# Patient Record
Sex: Female | Born: 1965 | Race: White | Hispanic: No | Marital: Single | Smoking: Never smoker
Health system: Southern US, Community
[De-identification: ages and names within clinical notes are randomized; demographics above are authoritative.]

## PROBLEM LIST (undated history)

## (undated) DIAGNOSIS — I10 Essential (primary) hypertension: Secondary | ICD-10-CM

## (undated) DIAGNOSIS — E119 Type 2 diabetes mellitus without complications: Secondary | ICD-10-CM

## (undated) HISTORY — PX: NO PAST SURGERIES: SHX2092

---

## 2015-10-21 ENCOUNTER — Inpatient Hospital Stay
Admission: EM | Admit: 2015-10-21 | Discharge: 2015-10-22 | DRG: 918 | Disposition: A | Discharge status: Home / Self Care | Payer: No Typology Code available for payment source | Attending: Internal Medicine | Admitting: Internal Medicine

## 2015-10-21 ENCOUNTER — Emergency Department: Payer: Self-pay

## 2015-10-21 DIAGNOSIS — F32A Depression, unspecified: Secondary | ICD-10-CM

## 2015-10-21 DIAGNOSIS — E871 Hypo-osmolality and hyponatremia: Secondary | ICD-10-CM | POA: Diagnosis present

## 2015-10-21 DIAGNOSIS — F329 Major depressive disorder, single episode, unspecified: Secondary | ICD-10-CM | POA: Diagnosis present

## 2015-10-21 DIAGNOSIS — T1491XA Suicide attempt, initial encounter: Secondary | ICD-10-CM

## 2015-10-21 DIAGNOSIS — R45851 Suicidal ideations: Secondary | ICD-10-CM

## 2015-10-21 DIAGNOSIS — E119 Type 2 diabetes mellitus without complications: Secondary | ICD-10-CM

## 2015-10-21 DIAGNOSIS — E11649 Type 2 diabetes mellitus with hypoglycemia without coma: Secondary | ICD-10-CM | POA: Diagnosis present

## 2015-10-21 DIAGNOSIS — I10 Essential (primary) hypertension: Secondary | ICD-10-CM | POA: Diagnosis present

## 2015-10-21 DIAGNOSIS — F4310 Post-traumatic stress disorder, unspecified: Secondary | ICD-10-CM

## 2015-10-21 DIAGNOSIS — Z825 Family history of asthma and other chronic lower respiratory diseases: Secondary | ICD-10-CM

## 2015-10-21 DIAGNOSIS — E162 Hypoglycemia, unspecified: Secondary | ICD-10-CM | POA: Diagnosis present

## 2015-10-21 DIAGNOSIS — T383X2A Poisoning by insulin and oral hypoglycemic [antidiabetic] drugs, intentional self-harm, initial encounter: Principal | ICD-10-CM | POA: Diagnosis present

## 2015-10-21 HISTORY — DX: Essential (primary) hypertension: I10

## 2015-10-21 HISTORY — DX: Type 2 diabetes mellitus without complications: E11.9

## 2015-10-21 LAB — COMPREHENSIVE METABOLIC PANEL
ALBUMIN: 3.8 g/dL (ref 3.5–5.0)
ALK PHOS: 91 U/L (ref 38–126)
ALT: 24 U/L (ref 14–54)
ANION GAP: 9 (ref 5–15)
AST: 39 U/L (ref 15–41)
BILIRUBIN TOTAL: 1.1 mg/dL (ref 0.3–1.2)
BUN: 14 mg/dL (ref 6–20)
CALCIUM: 9.1 mg/dL (ref 8.9–10.3)
CO2: 23 mmol/L (ref 22–32)
Chloride: 101 mmol/L (ref 101–111)
Creatinine, Ser: 0.74 mg/dL (ref 0.44–1.00)
GFR calc Af Amer: >60 mL/min (ref >60)
GLUCOSE: 317 mg/dL — AB (ref 65–99)
Potassium: 4.8 mmol/L (ref 3.5–5.1)
Sodium: 133 mmol/L — ABNORMAL LOW (ref 135–145)
TOTAL PROTEIN: 7.4 g/dL (ref 6.5–8.1)

## 2015-10-21 LAB — GLUCOSE, CAPILLARY
GLUCOSE-CAPILLARY: 314 mg/dL — AB (ref 65–99)
GLUCOSE-CAPILLARY: 171 mg/dL — AB (ref 65–99)
GLUCOSE-CAPILLARY: 223 mg/dL — AB (ref 65–99)
GLUCOSE-CAPILLARY: 76 mg/dL (ref 65–99)
GLUCOSE-CAPILLARY: 85 mg/dL (ref 65–99)
GLUCOSE-CAPILLARY: 45 mg/dL — AB (ref 65–99)
GLUCOSE-CAPILLARY: 49 mg/dL — AB (ref 65–99)
GLUCOSE-CAPILLARY: 158 mg/dL — AB (ref 65–99)
GLUCOSE-CAPILLARY: 106 mg/dL — AB (ref 65–99)
GLUCOSE-CAPILLARY: 133 mg/dL — AB (ref 65–99)
Glucose-Capillary: 151 mg/dL — ABNORMAL HIGH (ref 65–99)
Glucose-Capillary: 169 mg/dL — ABNORMAL HIGH (ref 65–99)
Glucose-Capillary: 82 mg/dL (ref 65–99)
Glucose-Capillary: 107 mg/dL — ABNORMAL HIGH (ref 65–99)
Glucose-Capillary: 141 mg/dL — ABNORMAL HIGH (ref 65–99)
Glucose-Capillary: 135 mg/dL — ABNORMAL HIGH (ref 65–99)
Glucose-Capillary: 73 mg/dL (ref 65–99)
Glucose-Capillary: 127 mg/dL — ABNORMAL HIGH (ref 65–99)
Glucose-Capillary: 131 mg/dL — ABNORMAL HIGH (ref 65–99)

## 2015-10-21 LAB — URINE DRUG SCREEN, QUALITATIVE (ARMC ONLY)
Amphetamines, Ur Screen: NOT DETECTED
BARBITURATES, UR SCREEN: NOT DETECTED
BENZODIAZEPINE, UR SCRN: NOT DETECTED
Cannabinoid 50 Ng, Ur ~~LOC~~: NOT DETECTED
Cocaine Metabolite,Ur ~~LOC~~: NOT DETECTED
MDMA (Ecstasy)Ur Screen: NOT DETECTED
METHADONE SCREEN, URINE: NOT DETECTED
OPIATE, UR SCREEN: NOT DETECTED
PHENCYCLIDINE (PCP) UR S: NOT DETECTED
Tricyclic, Ur Screen: NOT DETECTED

## 2015-10-21 LAB — URINALYSIS COMPLETE WITH MICROSCOPIC (ARMC ONLY)
BILIRUBIN URINE: NEGATIVE
Bacteria, UA: NONE SEEN
Hgb urine dipstick: NEGATIVE
Leukocytes, UA: NEGATIVE
Nitrite: NEGATIVE
Protein, ur: 30 mg/dL — AB
Specific Gravity, Urine: 1.027 (ref 1.005–1.030)
pH: 5 (ref 5.0–8.0)

## 2015-10-21 LAB — CBC WITH DIFFERENTIAL/PLATELET
BASOS PCT: 0 %
Basophils Absolute: 0 10*3/uL (ref 0–0.1)
Eosinophils Absolute: 0.2 10*3/uL (ref 0–0.7)
Eosinophils Relative: 2 %
HEMATOCRIT: 43.9 % (ref 35.0–47.0)
HEMOGLOBIN: 15.4 g/dL (ref 12.0–16.0)
LYMPHS ABS: 1.6 10*3/uL (ref 1.0–3.6)
LYMPHS PCT: 17 %
MCH: 31.3 pg (ref 26.0–34.0)
MCHC: 35 g/dL (ref 32.0–36.0)
MCV: 89.3 fL (ref 80.0–100.0)
MONOS PCT: 5 %
Monocytes Absolute: 0.4 10*3/uL (ref 0.2–0.9)
NEUTROS ABS: 7.2 10*3/uL — AB (ref 1.4–6.5)
NEUTROS PCT: 76 %
Platelets: 252 10*3/uL (ref 150–440)
RBC: 4.92 MIL/uL (ref 3.80–5.20)
RDW: 13.1 % (ref 11.5–14.5)
WBC: 9.5 10*3/uL (ref 3.6–11.0)

## 2015-10-21 LAB — ETHANOL: Alcohol, Ethyl (B): <5 mg/dL (ref <5)

## 2015-10-21 LAB — SALICYLATE LEVEL

## 2015-10-21 LAB — HCG, QUANTITATIVE, PREGNANCY: hCG, Beta Chain, Quant, S: 2 m[IU]/mL (ref <5)

## 2015-10-21 LAB — ACETAMINOPHEN LEVEL: Acetaminophen (Tylenol), Serum: <10 ug/mL — ABNORMAL LOW (ref 10–30)

## 2015-10-21 MED ORDER — DEXTROSE 10 % IV SOLN
INTRAVENOUS | Status: DC
  Administered 2015-10-21 – 2015-10-22 (×2): via INTRAVENOUS

## 2015-10-21 MED ORDER — DEXTROSE 50 % IV SOLN
1.0000 | Freq: Once | INTRAVENOUS | Status: AC
  Administered 2015-10-21: 50 mL via INTRAVENOUS
  Filled 2015-10-21: qty 50

## 2015-10-21 MED ORDER — LORAZEPAM 2 MG/ML IJ SOLN
2.0000 mg | Freq: Once | INTRAMUSCULAR | Status: AC
  Administered 2015-10-21: 2 mg via INTRAVENOUS

## 2015-10-21 MED ORDER — HALOPERIDOL LACTATE 5 MG/ML IJ SOLN
INTRAMUSCULAR | Status: AC
  Administered 2015-10-21: 5 mg via INTRAVENOUS
  Filled 2015-10-21: qty 1

## 2015-10-21 MED ORDER — DEXTROSE-NACL 5-0.9 % IV SOLN: INTRAVENOUS | Status: DC

## 2015-10-21 MED ORDER — ENOXAPARIN SODIUM 40 MG/0.4ML ~~LOC~~ SOLN
40.0000 mg | SUBCUTANEOUS | Status: DC
  Administered 2015-10-22: 40 mg via SUBCUTANEOUS
  Filled 2015-10-21: qty 0.4

## 2015-10-21 MED ORDER — ONDANSETRON HCL 4 MG PO TABS: 4.0000 mg | ORAL_TABLET | Freq: Four times a day (QID) | ORAL | Status: DC | PRN

## 2015-10-21 MED ORDER — DEXTROSE 50 % IV SOLN
INTRAVENOUS | Status: AC
  Filled 2015-10-21: qty 50

## 2015-10-21 MED ORDER — DEXTROSE 50 % IV SOLN
1.0000 | Freq: Once | INTRAVENOUS | Status: AC
  Administered 2015-10-21: 50 mL via INTRAVENOUS

## 2015-10-21 MED ORDER — KCL IN DEXTROSE-NACL 20-5-0.45 MEQ/L-%-% IV SOLN
Freq: Once | INTRAVENOUS | Status: AC
  Administered 2015-10-21: 13:00:00 via INTRAVENOUS
  Filled 2015-10-21: qty 1000

## 2015-10-21 MED ORDER — SODIUM CHLORIDE 0.9 % IV BOLUS (SEPSIS)
2000.0000 mL | Freq: Once | INTRAVENOUS | Status: AC
  Administered 2015-10-21: 2000 mL via INTRAVENOUS

## 2015-10-21 MED ORDER — LORAZEPAM 2 MG/ML IJ SOLN
INTRAMUSCULAR | Status: AC
  Administered 2015-10-21: 2 mg via INTRAVENOUS
  Filled 2015-10-21: qty 1

## 2015-10-21 MED ORDER — ONDANSETRON HCL 4 MG/2ML IJ SOLN: 4.0000 mg | Freq: Four times a day (QID) | INTRAMUSCULAR | Status: DC | PRN

## 2015-10-21 MED ORDER — HALOPERIDOL LACTATE 5 MG/ML IJ SOLN
5.0000 mg | Freq: Once | INTRAMUSCULAR | Status: AC
  Administered 2015-10-21: 5 mg via INTRAVENOUS

## 2015-10-21 MED ORDER — DEXTROSE 50 % IV SOLN
INTRAVENOUS | Status: AC
  Administered 2015-10-21: 20:00:00 50 mL
  Filled 2015-10-21: qty 50

## 2015-10-21 NOTE — ED Notes (Addendum)
EMS reports CBG of 380, CBG of 318 here

## 2015-10-21 NOTE — ED Notes (Signed)
Family member Actor called for info on pt. Told her we can't give her any info. She wanted to leave her phone number for pt to call, but did not know her phone number to give.

## 2015-10-21 NOTE — ED Provider Notes (Addendum)
Case discussed with poison control. Because of the patient's ongoing need for dextrose, poison control advises ongoing observation glucose checks and possible need to switch to D10 if further hyperglycemic episodes. Because of the patient's ongoing hypoglycemia we will admit her to the hospital.  It is noted that the patient is a member of the [place] benefits, however the patient due to severe agitation had to be sedated and restrained. The patient is currently sedated, and unable to discuss or sign benefit papers. However, as her current treating physician with the patient's requirements for sedation, and overdose with ongoing treatment and re-dosing of dextrose and a dextrose infusion I do not feel she is stable for transfer to the [facility] an hour away in [place] at this time. Patient will be admitted to our hospital.   Sharyn Creamer, MD 10/21/15 1647   Discussed with at [facility], Beryle Quant who is AOD and placed not regarding why patient unabel to consent for transport today.   Sharyn Creamer, MD 10/21/15 [phone]

## 2015-10-21 NOTE — ED Notes (Signed)
Pt resting with eyes closed.

## 2015-10-21 NOTE — H&P (Signed)
[facility] -  at [facility]   PATIENT NAME: Destiny Palmer    MR#:  [id number]  DATE OF BIRTH:  Sep 04, 1965  DATE OF ADMISSION:  10/21/2015  PRIMARY CARE PHYSICIAN: [facility]   REQUESTING/REFERRING PHYSICIAN: Dr Sharyn Creamer  CHIEF COMPLAINT:   Chief Complaint  Patient presents with  . Drug Overdose    HISTORY OF PRESENT ILLNESS:  Destiny Palmer  is a 50 y.o. female with a known history of Diabetes and hypertension. Brought in after she took 200 units of Novolin insulin. In the ER she was agitated and needed to be medicated. She is lethargic now but able to answer some questions. The patient told me that she took 200 units of Novolin because her sugars were high at 440. She denied any suicidal ideation for me. She had poor eye contact with me the entire time and I had to keep on asking her the same questions over and over again. ER physician put her on suicide precautions and involuntarily committed her. ER physician was concerned that she may have taken 200 units of Lantus insulin.  PAST MEDICAL HISTORY:   Past Medical History:  Diagnosis Date  . Diabetes mellitus without complication (HCC)   . Hypertension     PAST SURGICAL HISTORY:   Past Surgical History:  Procedure Laterality Date  . NO PAST SURGERIES      SOCIAL HISTORY:   Social History  Substance Use Topics  . Smoking status: Never Smoker  . Smokeless tobacco: Never Used  . Alcohol use Yes    FAMILY HISTORY:   Family History  Problem Relation Age of Onset  . COPD Mother     DRUG ALLERGIES:  No Known Allergies  REVIEW OF SYSTEMS:  Unable to obtain secondary to altered mental status or mood.  MEDICATIONS AT HOME:   Prior to Admission medications   Not on File    Patient stated she takes metformin, Lantus and Novolin insulin and a blood pressure medication. She gets her medications from a mail away order.  VITAL SIGNS:  Blood pressure 127/81, pulse 96,  temperature 98.4 F (36.9 C), resp. rate 19, height 5\' 6"  (1.676 m), weight 88 kg (194 lb), SpO2 96 %.  PHYSICAL EXAMINATION:  GENERAL:  50 y.o.-year-old patient lying in the bed with no acute distress.  EYES: Pupils equal, round, reactive to light and accommodation. No scleral icterus. HEENT: Head atraumatic, normocephalic. Oropharynx and nasopharynx clear.  NECK:  Supple, no jugular venous distention. No thyroid enlargement, no tenderness.  LUNGS: Normal breath sounds bilaterally, no wheezing, rales,rhonchi or crepitation. No use of accessory muscles of respiration.  CARDIOVASCULAR: S1, S2 normal. No murmurs, rubs, or gallops.  ABDOMEN: Soft, nontender, nondistended. Bowel sounds present. No organomegaly or mass.  EXTREMITIES: No pedal edema, cyanosis, or clubbing.  NEUROLOGIC: Patient moves her arms on her own. PSYCHIATRIC: The patient is lethargic SKIN: No rash, lesion, or ulcer.   LABORATORY PANEL:   CBC  Recent Labs Lab 10/21/15 1219  WBC 9.5  HGB 15.4  HCT 43.9  PLT 252   ------------------------------------------------------------------------------------------------------------------  Chemistries   Recent Labs Lab 10/21/15 1219  NA 133*  K 4.8  CL 101  CO2 23  GLUCOSE 317*  BUN 14  CREATININE 0.74  CALCIUM 9.1  AST 39  ALT 24  ALKPHOS 91  BILITOT 1.1   ------------------------------------------------------------------------------------------------------------------   RADIOLOGY:  Dg Chest 1 View  Result Date: 10/21/2015 CLINICAL DATA:  Overdose.  Tachycardia. EXAM: CHEST 1 VIEW  COMPARISON:  None. FINDINGS: The cardiomediastinal silhouette is within normal limits. No airspace consolidation, edema, pleural effusion, or pneumothorax is identified. No acute osseous abnormality is seen. IMPRESSION: No active disease. Electronically Signed   By: Sebastian Ache M.D.   On: 10/21/2015 13:46    IMPRESSION AND PLAN:   1. Hypoglycemia in a diabetic patient that took  200 units of insulin which is likely Novolin insulin. Patient is on D5 drip. Check fingersticks every 2 hours. If she took too much of the NovoLog insulin hopefully this will be out of her system shortly. Her initial sugar was 317 at 12 noon. If she took 200 units of Lantus insulin this may be a more prolonged hypoglycemia over the next 24 hours or so. If sugars continue to trend low I may have to switch to D10 drip. 2. Suspected suicide attempt. 1-1 and suicide watch. Psychiatric consultation. 3. Essential hypertension. Blood pressure medication on hold because I'm not sure what it is. 4. Hyponatremia. Switch IV fluids D5ns.   All the records are reviewed and case discussed with ED provider. Management plans discussed with the patient, and she is in agreement. Felicia the aide in the emergency room was present during the entire history and physical exam for this patient.  CODE STATUS: Full code  TOTAL TIME TAKING CARE OF THIS PATIENT: 50 minutes.    Alford Highland M.D on 10/21/2015 at 5:00 PM  Between 7am to 6pm - Pager - [phone]  After 6pm call admission pager [phone]  [facility]  [phone]  CC: Primary care physician; [facility]

## 2015-10-21 NOTE — ED Notes (Signed)
Glucose check is 82. RN Delorise Shiner notified.

## 2015-10-21 NOTE — ED Notes (Signed)
Pt becoming agitated and combative with staff again. EDP made aware and verbal orders received for 2mg  ativan and 5mg  haldol

## 2015-10-21 NOTE — ED Notes (Signed)
Pt offered meal tray, pt refusing to eat at this time.

## 2015-10-21 NOTE — ED Notes (Signed)
Called [place] at 1700 and spoke to [place], AOD, she was documenting in the computer patient unable to consent for transfer and when she was able to consent to initiate transfer.

## 2015-10-21 NOTE — ED Notes (Signed)
Report called to floor, told they need 15 min to set up room for suicide precautions

## 2015-10-21 NOTE — ED Provider Notes (Addendum)
[facility] Emergency Department Provider Note  ___________________________________________   None    (approximate)  I have reviewed the triage vital signs and the nursing notes.   HISTORY  Chief Complaint Drug Overdose  HPI Destiny Palmer is a 50 y.o. female who in efforts to harm herself per EMS and Sheriff's Department, patient took 200 units of not doing insulin. Patient takes insulin for her own diabetes and she states that she uses her own sliding scale not prescribed by Dr. When asked patient states that she just takes is much insulin as she thinks she needs. Patient today took 200 units of Novulin Insulin but she will not talk to [place] and tell us why she took it or what has been bothering her lately. Patient is giving very short answers and will not discuss further any details as to why she did which she did. IVC papers are being drawn for patient per a family member and she presented to the emergency department with the sheriff. We also found out from [facility] the patient had called the help line and she had also attempted to harm herself with a knife and now there is a question if she use 150 units or 200 units of Novolin regular.     Past Medical History:  Diagnosis Date  . Diabetes mellitus without complication (HCC)   . Hypertension     There are no active problems to display for this patient.   History reviewed. No pertinent surgical history.  Prior to Admission medications   Not on File    Allergies Review of patient's allergies indicates no known allergies.  No family history on file.  Social History Social History  Substance Use Topics  . Smoking status: Never Smoker  . Smokeless tobacco: Not on file  . Alcohol use Yes    Review of Systems Constitutional: No fever/chills Eyes: No visual changes. ENT: No sore throat. Cardiovascular: Denies chest pain. Respiratory: Denies shortness of breath. Gastrointestinal: No abdominal  pain.  No nausea, no vomiting.  No diarrhea.  No constipation. Genitourinary: Negative for dysuria. Musculoskeletal: Negative for back pain. Skin: Negative for rash. Neurological: Negative for headaches, focal weakness or numbness. Psychological: Suicide attempt, depression, unknown reason for psychosis 10-point ROS otherwise negative.  ____________________________________________   PHYSICAL EXAM:  VITAL SIGNS: ED Triage Vitals  Enc Vitals Group     BP 10/21/15 1211 (!) 189/95     Pulse Rate 10/21/15 1213 (!) 115     Resp 10/21/15 1214 19     Temp 10/21/15 1211 98.4 F (36.9 C)     Temp src --      SpO2 10/21/15 1213 96 %     Weight 10/21/15 1211 194 lb (88 kg)     Height 10/21/15 1211  (1.676 m)     Head Circumference --      Peak Flow --      Pain Score --      Pain Loc --      Pain Edu? --      Excl. in GC? --     Constitutional: Alert and oriented. Well appearing and in no acute distress. Pt disheveled though. Eyes: Conjunctivae are normal. PERRL. EOMI. Head: Atraumatic. Nose: No congestion/rhinnorhea. Mouth/Throat: Mucous membranes are moist.  Oropharynx non-erythematous. Neck: No stridor.   Cardiovascular: Tachycardia, regular rhythm. Grossly normal heart sounds.  Good peripheral circulation. Respiratory: Normal respiratory effort.  No retractions. Lungs CTAB. Gastrointestinal: Soft and nontender. No distention. No  abdominal bruits. No CVA tenderness. Musculoskeletal: No lower extremity tenderness nor edema.  No joint effusions. Neurologic:  Normal speech and language. No gross focal neurologic deficits are appreciated. No gait instability. Skin:  Skin is warm, dry and intact. No rash noted. Psychiatric: Mood and affect are depressed and a standoffish.  [name] Speech and behavior are normal.  ____________________________________________   LABS (all labs ordered are listed, but only abnormal results are displayed)  Labs Reviewed  CBC WITH  DIFFERENTIAL/PLATELET - Abnormal; Notable for the following:       Result Value   Neutro Abs 7.2 (*)    All other components within normal limits  COMPREHENSIVE METABOLIC PANEL - Abnormal; Notable for the following:    Sodium 133 (*)    Glucose, Bld 317 (*)    All other components within normal limits  URINALYSIS COMPLETEWITH MICROSCOPIC ([facility] ONLY) - Abnormal; Notable for the following:    Color, Urine YELLOW (*)    APPearance CLEAR (*)    Glucose, UA >500 (*)    Ketones, ur TRACE (*)    Protein, ur 30 (*)    Squamous Epithelial / LPF 0-5 (*)    All other components within normal limits  GLUCOSE, CAPILLARY - Abnormal; Notable for the following:    Glucose-Capillary 314 (*)    All other components within normal limits  GLUCOSE, CAPILLARY - Abnormal; Notable for the following:    Glucose-Capillary 223 (*)    All other components within normal limits  GLUCOSE, CAPILLARY - Abnormal; Notable for the following:    Glucose-Capillary 171 (*)    All other components within normal limits  GLUCOSE, CAPILLARY - Abnormal; Notable for the following:    Glucose-Capillary 151 (*)    All other components within normal limits  ETHANOL  URINE DRUG SCREEN, QUALITATIVE ([facility] ONLY)  HCG, QUANTITATIVE, PREGNANCY  GLUCOSE, CAPILLARY   ____________________________________________  EKG ED ECG REPORT I, Leona Carry, the attending physician, personally viewed and interpreted this ECG.   Date: 10/21/2015  EKG Time: 1217  Rate: 111  Rhythm: sinus tachycardia  Axis: Left axis deviation  Intervals:none  ST&T Change: Poor R-wave progression and nonspecific ST changes   ____________________________________________  RADIOLOGY Dg Chest 1 View  Result Date: 10/21/2015 CLINICAL DATA:  Overdose.  Tachycardia. EXAM: CHEST 1 VIEW COMPARISON:  None. FINDINGS: The cardiomediastinal silhouette is within normal limits. No airspace consolidation, edema, pleural effusion, or pneumothorax is identified.  No acute osseous abnormality is seen. IMPRESSION: No active disease. Electronically Signed   By: Sebastian Ache M.D.   On: 10/21/2015 13:46    ____________________________________________   PROCEDURES  Procedure(s) performed: None  Procedures  Critical Care performed: No  ____________________________________________   INITIAL IMPRESSION / ASSESSMENT AND PLAN / ED COURSE  Pertinent labs & imaging results that were available during my care of the patient were reviewed by me and considered in my medical decision making (see chart for details).  2:50 PM Patient's initial glucometer was high. Patient was placed with a one-on-one sitter, started on D5 half-normal saline, we're going to attempt to feed her as well, and do to 30 minute Accu-Cheks.  2:50 PM Patient is now cooperative and much call Mark. Patient tried to rip out all of her IVs, got extremely tachycardic, and we had to place her in soft restraints. Patient is currently cooperative. Patient's glucometer is a been trending down and now she is at 151.  2:50 PM Patient's glucose dropped to 81. Patient refused to eat.  Patient was given amp of dextrose. Pt will be signed out to Dr. Fanny Bien at 4 PM. Patient is to be observed for total 6 hours medically before being signed over to psychiatry once her glucometer has stabilized.  3:37 PM Patient's blood sugar again dropped to 107. Patient also again was trying to pull out all of her IVs and trying to rip off her restraints. Patient was given 5 of Haldol and 2 of Ativan IM so that we can continue her treatment.  Clinical Course     ____________________________________________   FINAL CLINICAL IMPRESSION(S) / ED DIAGNOSES  Final diagnoses:  Depression  Insulin overdose, intentional self-harm, initial encounter (HCC)  Suicidal ideations  Suicide attempt (HCC)      NEW MEDICATIONS STARTED DURING THIS VISIT:  New Prescriptions   No medications on file     Note:  This  document was prepared using Dragon voice recognition software and may include unintentional dictation errors.    Leona Carry, MD 10/21/15 1450    Leona Carry, MD 10/21/15 [phone]

## 2015-10-21 NOTE — ED Notes (Signed)
Pt pulling at IVs and becoming aggressive with staff, HR noted to be 180-200 BPM, EDP made aware and verbal orders received for 2L fluid bolus. Pt placed on Zoll monitor

## 2015-10-21 NOTE — ED Notes (Signed)
Pt attempting to pull IV out, IV wrapped with gauze, sitter and sheriff at bedside

## 2015-10-21 NOTE — ED Triage Notes (Signed)
Pt BIB EMS for overdose, pt with sheriff due to IVC papers. Pt reports thoughts of suicide, took 200 units of Novolin Insulin 30 min PTA.

## 2015-10-22 ENCOUNTER — Inpatient Hospital Stay
Admission: AD | Admit: 2015-10-22 | Discharge: 2015-10-27 | DRG: 882 | Disposition: A | Discharge status: Home / Self Care | Payer: No Typology Code available for payment source | Source: Intra-hospital | Attending: Psychiatry | Admitting: Psychiatry

## 2015-10-22 DIAGNOSIS — E11649 Type 2 diabetes mellitus with hypoglycemia without coma: Secondary | ICD-10-CM | POA: Diagnosis present

## 2015-10-22 DIAGNOSIS — E119 Type 2 diabetes mellitus without complications: Secondary | ICD-10-CM | POA: Diagnosis present

## 2015-10-22 DIAGNOSIS — Z9114 Patient's other noncompliance with medication regimen: Secondary | ICD-10-CM

## 2015-10-22 DIAGNOSIS — F4325 Adjustment disorder with mixed disturbance of emotions and conduct: Secondary | ICD-10-CM | POA: Diagnosis present

## 2015-10-22 DIAGNOSIS — Z915 Personal history of self-harm: Secondary | ICD-10-CM | POA: Diagnosis not present

## 2015-10-22 DIAGNOSIS — I10 Essential (primary) hypertension: Secondary | ICD-10-CM | POA: Diagnosis present

## 2015-10-22 DIAGNOSIS — G4700 Insomnia, unspecified: Secondary | ICD-10-CM | POA: Diagnosis present

## 2015-10-22 DIAGNOSIS — T1491XA Suicide attempt, initial encounter: Secondary | ICD-10-CM | POA: Diagnosis present

## 2015-10-22 DIAGNOSIS — E1169 Type 2 diabetes mellitus with other specified complication: Secondary | ICD-10-CM | POA: Diagnosis present

## 2015-10-22 DIAGNOSIS — F4310 Post-traumatic stress disorder, unspecified: Principal | ICD-10-CM | POA: Diagnosis present

## 2015-10-22 DIAGNOSIS — Z794 Long term (current) use of insulin: Secondary | ICD-10-CM

## 2015-10-22 DIAGNOSIS — E785 Hyperlipidemia, unspecified: Secondary | ICD-10-CM | POA: Diagnosis present

## 2015-10-22 DIAGNOSIS — Z79899 Other long term (current) drug therapy: Secondary | ICD-10-CM | POA: Diagnosis not present

## 2015-10-22 DIAGNOSIS — Z653 Problems related to other legal circumstances: Secondary | ICD-10-CM | POA: Diagnosis not present

## 2015-10-22 DIAGNOSIS — R45851 Suicidal ideations: Secondary | ICD-10-CM | POA: Diagnosis present

## 2015-10-22 DIAGNOSIS — Z825 Family history of asthma and other chronic lower respiratory diseases: Secondary | ICD-10-CM | POA: Diagnosis not present

## 2015-10-22 LAB — GLUCOSE, CAPILLARY
GLUCOSE-CAPILLARY: 245 mg/dL — AB (ref 65–99)
GLUCOSE-CAPILLARY: 248 mg/dL — AB (ref 65–99)
GLUCOSE-CAPILLARY: 313 mg/dL — AB (ref 65–99)
Glucose-Capillary: 200 mg/dL — ABNORMAL HIGH (ref 65–99)
Glucose-Capillary: 228 mg/dL — ABNORMAL HIGH (ref 65–99)
Glucose-Capillary: 244 mg/dL — ABNORMAL HIGH (ref 65–99)
Glucose-Capillary: 249 mg/dL — ABNORMAL HIGH (ref 65–99)
Glucose-Capillary: 254 mg/dL — ABNORMAL HIGH (ref 65–99)
Glucose-Capillary: 270 mg/dL — ABNORMAL HIGH (ref 65–99)

## 2015-10-22 LAB — HEMOGLOBIN A1C: HEMOGLOBIN A1C: 9 % — AB (ref 4.0–6.0)

## 2015-10-22 LAB — CBC
HCT: 41.6 % (ref 35.0–47.0)
Hemoglobin: 14.6 g/dL (ref 12.0–16.0)
MCH: 31.4 pg (ref 26.0–34.0)
MCHC: 35 g/dL (ref 32.0–36.0)
MCV: 89.7 fL (ref 80.0–100.0)
PLATELETS: 199 10*3/uL (ref 150–440)
RBC: 4.64 MIL/uL (ref 3.80–5.20)
RDW: 12.7 % (ref 11.5–14.5)
WBC: 8.2 10*3/uL (ref 3.6–11.0)

## 2015-10-22 LAB — BASIC METABOLIC PANEL
Anion gap: 7 (ref 5–15)
CHLORIDE: 104 mmol/L (ref 101–111)
CO2: 25 mmol/L (ref 22–32)
CREATININE: 0.56 mg/dL (ref 0.44–1.00)
Calcium: 8.5 mg/dL — ABNORMAL LOW (ref 8.9–10.3)
Glucose, Bld: 254 mg/dL — ABNORMAL HIGH (ref 65–99)
Potassium: 3.8 mmol/L (ref 3.5–5.1)
SODIUM: 136 mmol/L (ref 135–145)

## 2015-10-22 MED ORDER — MAGNESIUM HYDROXIDE 400 MG/5ML PO SUSP: 30.0000 mL | Freq: Every day | ORAL | Status: DC | PRN

## 2015-10-22 MED ORDER — INSULIN ASPART 100 UNIT/ML ~~LOC~~ SOLN
0.0000 [IU] | Freq: Three times a day (TID) | SUBCUTANEOUS | Status: DC
  Administered 2015-10-22: 8 [IU] via SUBCUTANEOUS
  Filled 2015-10-22: qty 8

## 2015-10-22 MED ORDER — INSULIN GLARGINE 100 UNIT/ML ~~LOC~~ SOLN
20.0000 [IU] | Freq: Every day | SUBCUTANEOUS | Status: DC
Start: 2015-10-23 — End: 2015-10-24
  Administered 2015-10-23 – 2015-10-24 (×2): 20 [IU] via SUBCUTANEOUS
  Filled 2015-10-22 (×2): qty 0.2

## 2015-10-22 MED ORDER — ALUM & MAG HYDROXIDE-SIMETH 200-200-20 MG/5ML PO SUSP
30.0000 mL | ORAL | Status: DC | PRN
  Filled 2015-10-22: qty 30

## 2015-10-22 MED ORDER — INSULIN ASPART 100 UNIT/ML ~~LOC~~ SOLN
0.0000 [IU] | Freq: Three times a day (TID) | SUBCUTANEOUS | Status: DC
  Administered 2015-10-22: 5 [IU] via SUBCUTANEOUS
  Administered 2015-10-23: 8 [IU] via SUBCUTANEOUS
  Administered 2015-10-23: 5 [IU] via SUBCUTANEOUS
  Administered 2015-10-24: 8 [IU] via SUBCUTANEOUS
  Administered 2015-10-24: 5 [IU] via SUBCUTANEOUS
  Administered 2015-10-24 – 2015-10-25 (×3): 8 [IU] via SUBCUTANEOUS
  Administered 2015-10-25: 5 [IU] via SUBCUTANEOUS
  Administered 2015-10-26 (×2): 11 [IU] via SUBCUTANEOUS
  Administered 2015-10-26: 5 [IU] via SUBCUTANEOUS
  Administered 2015-10-27: 8 [IU] via SUBCUTANEOUS
  Administered 2015-10-27: 3 [IU] via SUBCUTANEOUS
  Administered 2015-10-27: 15 [IU] via SUBCUTANEOUS
  Filled 2015-10-22: qty 11
  Filled 2015-10-22 (×2): qty 8
  Filled 2015-10-22: qty 11
  Filled 2015-10-22 (×2): qty 8
  Filled 2015-10-22: qty 5
  Filled 2015-10-22 (×2): qty 8
  Filled 2015-10-22 (×2): qty 5
  Filled 2015-10-22 (×2): qty 3

## 2015-10-22 MED ORDER — ACETAMINOPHEN 325 MG PO TABS: 650.0000 mg | ORAL_TABLET | Freq: Four times a day (QID) | ORAL | Status: DC | PRN

## 2015-10-22 MED ORDER — INSULIN GLARGINE 100 UNIT/ML ~~LOC~~ SOLN
20.0000 [IU] | Freq: Every day | SUBCUTANEOUS | Status: DC
  Administered 2015-10-22: 15:00:00 20 [IU] via SUBCUTANEOUS
  Filled 2015-10-22 (×2): qty 0.2

## 2015-10-22 MED ORDER — INSULIN ASPART 100 UNIT/ML ~~LOC~~ SOLN: 0.0000 [IU] | Freq: Three times a day (TID) | SUBCUTANEOUS | Status: DC

## 2015-10-22 NOTE — BH Assessment (Signed)
Patient is to be admitted to [facility] by Dr. Toni Amend.  Attending Physician will be Dr. Ardyth Harps.   Patient has been assigned to room 325, by [facility] Charge Nurse Gwen.   Intake Paper Work has been signed and placed on patient chart.  Staff is aware of the admission Corrie Dandy, Patient's Nurse & Marny Lowenstein, Patient Access).

## 2015-10-22 NOTE — BH Assessment (Signed)
Writer called and confirmed the [facility] and was on [employer]. Writer talked with the AOD ([phone]).

## 2015-10-22 NOTE — Consult Note (Signed)
[place] [facility]iatry Consult   Reason for Consult:  Consult for 50 year old woman presented to the hospital yesterday after intentional overdose of insulin Referring Physician:  Posey Pronto Patient Identification: [facility] MRN:  [id number] Principal Diagnosis: PTSD (post-traumatic stress disorder) Diagnosis:   Patient Active Problem List   Diagnosis Date Noted  . Suicide attempt ([place]) [T14.91] 10/22/2015  . PTSD (post-traumatic stress disorder) [F43.10] 10/22/2015  . Diabetes mellitus without complication ([place]) [M57.8] 10/22/2015  . Hypoglycemia [E16.2] 10/21/2015    Total Time spent with patient: 1 hour  Subjective:   YULIANNA FOLSE is a 50 y.o. female patient admitted with "I took a lot of insulin because it was high".  HPI:  Patient interviewed. Chart reviewed. Labs and vitals reviewed. 50 year old woman presented to the emergency room yesterday after taking 200 units of insulin. Patient claims that she did this because her blood sugar reading was elevated. At the same time she admits that she was feeling very stressed out and overwhelmed at the time. She has been under a great deal of stress and feels like things have reached "the breaking point" for her. Mood stays nervous and down and overwhelmed a lot of the time. She has had chronic anxiety and depression problems ever since being in the [employer]. She sleeps poorly at night only a few hours at a time. Patient tried to claim that her insulin usage was simply in response to her blood sugars but also admitted that she only at most uses about 150 units a day so it was clearly not normal to take the 200. She denied that she had been drinking or abusing any drugs. Major stresses in her life include financial and legal problems. Yesterday the police came to break into her house and search should with a warrant because of what she claims is a misunderstanding involving her mother having committed credit card fraud. This is on top of  multiple other medical and social problems involving finances. Patient does see an outpatient therapist or psychiatrist at the [facility]. Seems to have little specific information about it. Says that she is generally compliant with medicine although she hadn't taken her medicine in the last day or so.  Social history: Lives with her niece. She is retired from [employer] and has a pension and is service connected with the [place]. Doesn't have much activity during the day. Evidently has significant ongoing legal problems which she blames on other members of her family.  Medical history: Diabetes. Her management of it and use of insulin appears to be rather disorganized from the way she describes it. Also has high blood pressure and elevated cholesterol but doesn't know the names of any of the medicines that she takes.  Substance abuse history: Says she drinks on occasion denies alcohol abuse. Denies any drug abuse.  Past Psychiatric History: Sees a doctor or therapist or both at the [facility]. Was actually on the phone with him she says yesterday when she overdosed. She claims she has never been admitted to a psychiatric hospital in the past. She does have a positive prior history of suicide attempts either when she was in jail or right after which was just a couple months ago. Does not know the medicines that she is taking. She does say that she has been given a diagnosis of posttraumatic stress disorder.  Risk to Self: Is patient at risk for suicide?: Yes Risk to Others:   Prior Inpatient Therapy:   Prior Outpatient Therapy:  Past Medical History:  Past Medical History:  Diagnosis Date  . Diabetes mellitus without complication ([place])   . Hypertension     Past Surgical History:  Procedure Laterality Date  . NO PAST SURGERIES     Family History:  Family History  Problem Relation Age of Onset  . COPD Mother    Family Psychiatric  History: Substance abuse problems in her family also a brother  who shot himself relatively recently. Social History:  History  Alcohol Use  . Yes     History  Drug Use No    Social History   Social History  . Marital status: Single    Spouse name: N/A  . Number of children: N/A  . Years of education: N/A   Social History Main Topics  . Smoking status: Never Smoker  . Smokeless tobacco: Never Used  . Alcohol use Yes  . Drug use: No  . Sexual activity: Not Asked   Other Topics Concern  . None   Social History Narrative  . None   Additional Social History:    Allergies:  No Known Allergies  Labs:  Results for orders placed or performed during the hospital encounter of 10/21/15 (from the past 48 hour(s))  Glucose, capillary     Status: Abnormal   Collection Time: 10/21/15 12:14 PM  Result Value Ref Range   Glucose-Capillary 314 (H) 65 - 99 mg/dL  CBC with Differential     Status: Abnormal   Collection Time: 10/21/15 12:19 PM  Result Value Ref Range   WBC 9.5 3.6 - 11.0 K/uL   RBC 4.92 3.80 - 5.20 MIL/uL   Hemoglobin 15.4 12.0 - 16.0 g/dL   HCT 43.9 35.0 - 47.0 %   MCV 89.3 80.0 - 100.0 fL   MCH 31.3 26.0 - 34.0 pg   MCHC 35.0 32.0 - 36.0 g/dL   RDW 13.1 11.5 - 14.5 %   Platelets 252 150 - 440 K/uL   Neutrophils Relative % 76 %   Neutro Abs 7.2 (H) 1.4 - 6.5 K/uL   Lymphocytes Relative 17 %   Lymphs Abs 1.6 1.0 - 3.6 K/uL   Monocytes Relative 5 %   Monocytes Absolute 0.4 0.2 - 0.9 K/uL   Eosinophils Relative 2 %   Eosinophils Absolute 0.2 0 - 0.7 K/uL   Basophils Relative 0 %   Basophils Absolute 0.0 0 - 0.1 K/uL  Comprehensive metabolic panel     Status: Abnormal   Collection Time: 10/21/15 12:19 PM  Result Value Ref Range   Sodium 133 (L) 135 - 145 mmol/L   Potassium 4.8 3.5 - 5.1 mmol/L    Comment: HEMOLYSIS AT THIS LEVEL MAY AFFECT RESULT   Chloride 101 101 - 111 mmol/L   CO2 23 22 - 32 mmol/L   Glucose, Bld 317 (H) 65 - 99 mg/dL   BUN 14 6 - 20 mg/dL   Creatinine, Ser 0.74 0.44 - 1.00 mg/dL   Calcium 9.1  8.9 - 10.3 mg/dL   Total Protein 7.4 6.5 - 8.1 g/dL   Albumin 3.8 3.5 - 5.0 g/dL   AST 39 15 - 41 U/L   ALT 24 14 - 54 U/L   Alkaline Phosphatase 91 38 - 126 U/L   Total Bilirubin 1.1 0.3 - 1.2 mg/dL   GFR calc non Af Amer >60 >60 mL/min   GFR calc Af Amer >60 >60 mL/min    Comment: (NOTE) The eGFR has been calculated using the CKD EPI equation.  This calculation has not been validated in all clinical situations. eGFR's persistently <60 mL/min signify possible Chronic Kidney Disease.    Anion gap 9 5 - 15  Ethanol     Status: None   Collection Time: 10/21/15 12:19 PM  Result Value Ref Range   Alcohol, Ethyl (B) <5 <5 mg/dL    Comment:        LOWEST DETECTABLE LIMIT FOR SERUM ALCOHOL IS 5 mg/dL FOR MEDICAL PURPOSES ONLY   hCG, quantitative, pregnancy     Status: None   Collection Time: 10/21/15 12:19 PM  Result Value Ref Range   hCG, Beta Chain, Quant, S 2 <5 mIU/mL    Comment:          GEST. AGE      CONC.  (mIU/mL)   <=1 WEEK        5 - 50     2 WEEKS       50 - 500     3 WEEKS       100 - 10,000     4 WEEKS     1,000 - 30,000     5 WEEKS     3,500 - 115,000   6-8 WEEKS     12,000 - 270,000    12 WEEKS     15,000 - 220,000        FEMALE AND NON-PREGNANT FEMALE:     LESS THAN 5 mIU/mL   Salicylate level     Status: None   Collection Time: 10/21/15 12:19 PM  Result Value Ref Range   Salicylate Lvl <2.6 2.8 - 30.0 mg/dL  Acetaminophen level     Status: Abnormal   Collection Time: 10/21/15 12:19 PM  Result Value Ref Range   Acetaminophen (Tylenol), Serum <10 (L) 10 - 30 ug/mL    Comment:        THERAPEUTIC CONCENTRATIONS VARY SIGNIFICANTLY. A RANGE OF 10-30 ug/mL MAY BE AN EFFECTIVE CONCENTRATION FOR MANY PATIENTS. HOWEVER, SOME ARE BEST TREATED AT CONCENTRATIONS OUTSIDE THIS RANGE. ACETAMINOPHEN CONCENTRATIONS >150 ug/mL AT 4 HOURS AFTER INGESTION AND >50 ug/mL AT 12 HOURS AFTER INGESTION ARE OFTEN ASSOCIATED WITH TOXIC REACTIONS.   Glucose, capillary      Status: Abnormal   Collection Time: 10/21/15 12:47 PM  Result Value Ref Range   Glucose-Capillary 223 (H) 65 - 99 mg/dL   Comment 1 Notify RN   Urine Drug Screen, Qualitative ([facility] only)     Status: None   Collection Time: 10/21/15  1:10 PM  Result Value Ref Range   Tricyclic, Ur Screen NONE DETECTED NONE DETECTED   Amphetamines, Ur Screen NONE DETECTED NONE DETECTED   MDMA (Ecstasy)Ur Screen NONE DETECTED NONE DETECTED   Cocaine Metabolite,Ur [place] NONE DETECTED NONE DETECTED   Opiate, Ur Screen NONE DETECTED NONE DETECTED   Phencyclidine (PCP) Ur S NONE DETECTED NONE DETECTED   Cannabinoid 50 Ng, Ur  NONE DETECTED NONE DETECTED   Barbiturates, Ur Screen NONE DETECTED NONE DETECTED   Benzodiazepine, Ur Scrn NONE DETECTED NONE DETECTED   Methadone Scn, Ur NONE DETECTED NONE DETECTED    Comment: (NOTE) 712  Tricyclics, urine               Cutoff 1000 ng/mL 200  Amphetamines, urine             Cutoff 1000 ng/mL 300  MDMA (Ecstasy), urine           Cutoff 500 ng/mL 400  Cocaine Metabolite, urine  Cutoff 300 ng/mL 500  Opiate, urine                   Cutoff 300 ng/mL 600  Phencyclidine (PCP), urine      Cutoff 25 ng/mL 700  Cannabinoid, urine              Cutoff 50 ng/mL 800  Barbiturates, urine             Cutoff 200 ng/mL 900  Benzodiazepine, urine           Cutoff 200 ng/mL 1000 Methadone, urine                Cutoff 300 ng/mL 1100 1200 The urine drug screen provides only a preliminary, unconfirmed 1300 analytical test result and should not be used for non-medical 1400 purposes. Clinical consideration and professional judgment should 1500 be applied to any positive drug screen result due to possible 1600 interfering substances. A more specific alternate chemical method 1700 must be used in order to obtain a confirmed analytical result.  1800 Gas chromato graphy / mass spectrometry (GC/MS) is the preferred 1900 confirmatory method.   Urinalysis complete, with microscopic  ([facility] only)     Status: Abnormal   Collection Time: 10/21/15  1:10 PM  Result Value Ref Range   Color, Urine YELLOW (A) YELLOW   APPearance CLEAR (A) CLEAR   Glucose, UA >500 (A) NEGATIVE mg/dL   Bilirubin Urine NEGATIVE NEGATIVE   Ketones, ur TRACE (A) NEGATIVE mg/dL   Specific Gravity, Urine 1.027 1.005 - 1.030   Hgb urine dipstick NEGATIVE NEGATIVE   pH 5.0 5.0 - 8.0   Protein, ur 30 (A) NEGATIVE mg/dL   Nitrite NEGATIVE NEGATIVE   Leukocytes, UA NEGATIVE NEGATIVE   RBC / HPF 0-5 0 - 5 RBC/hpf   WBC, UA 0-5 0 - 5 WBC/hpf   Bacteria, UA NONE SEEN NONE SEEN   Squamous Epithelial / LPF 0-5 (A) NONE SEEN   Mucous PRESENT   Glucose, capillary     Status: Abnormal   Collection Time: 10/21/15  1:15 PM  Result Value Ref Range   Glucose-Capillary 171 (H) 65 - 99 mg/dL   Comment 1 Notify RN   Glucose, capillary     Status: Abnormal   Collection Time: 10/21/15  1:47 PM  Result Value Ref Range   Glucose-Capillary 151 (H) 65 - 99 mg/dL   Comment 1 Notify RN   Glucose, capillary     Status: None   Collection Time: 10/21/15  2:33 PM  Result Value Ref Range   Glucose-Capillary 82 65 - 99 mg/dL  Glucose, capillary     Status: Abnormal   Collection Time: 10/21/15  3:03 PM  Result Value Ref Range   Glucose-Capillary 169 (H) 65 - 99 mg/dL   Comment 1 Notify RN   Glucose, capillary     Status: Abnormal   Collection Time: 10/21/15  3:36 PM  Result Value Ref Range   Glucose-Capillary 107 (H) 65 - 99 mg/dL  Glucose, capillary     Status: None   Collection Time: 10/21/15  4:01 PM  Result Value Ref Range   Glucose-Capillary 76 65 - 99 mg/dL   Comment 1 Notify RN   Glucose, capillary     Status: Abnormal   Collection Time: 10/21/15  4:34 PM  Result Value Ref Range   Glucose-Capillary 141 (H) 65 - 99 mg/dL  Glucose, capillary     Status: None   Collection Time: 10/21/15  5:02 PM  Result Value Ref Range   Glucose-Capillary 85 65 - 99 mg/dL  Glucose, capillary     Status: Abnormal    Collection Time: 10/21/15  5:33 PM  Result Value Ref Range   Glucose-Capillary 135 (H) 65 - 99 mg/dL   Comment 1 Notify RN   Glucose, capillary     Status: None   Collection Time: 10/21/15  6:03 PM  Result Value Ref Range   Glucose-Capillary 73 65 - 99 mg/dL  Glucose, capillary     Status: Abnormal   Collection Time: 10/21/15  6:42 PM  Result Value Ref Range   Glucose-Capillary 127 (H) 65 - 99 mg/dL  Glucose, capillary     Status: Abnormal   Collection Time: 10/21/15  8:01 PM  Result Value Ref Range   Glucose-Capillary 49 (L) 65 - 99 mg/dL  Glucose, capillary     Status: Abnormal   Collection Time: 10/21/15  8:08 PM  Result Value Ref Range   Glucose-Capillary 45 (L) 65 - 99 mg/dL  Glucose, capillary     Status: Abnormal   Collection Time: 10/21/15  8:36 PM  Result Value Ref Range   Glucose-Capillary 158 (H) 65 - 99 mg/dL  Glucose, capillary     Status: Abnormal   Collection Time: 10/21/15  9:16 PM  Result Value Ref Range   Glucose-Capillary 131 (H) 65 - 99 mg/dL  Glucose, capillary     Status: Abnormal   Collection Time: 10/21/15  9:51 PM  Result Value Ref Range   Glucose-Capillary 106 (H) 65 - 99 mg/dL  Glucose, capillary     Status: Abnormal   Collection Time: 10/21/15 11:12 PM  Result Value Ref Range   Glucose-Capillary 133 (H) 65 - 99 mg/dL  Glucose, capillary     Status: Abnormal   Collection Time: 10/22/15 12:44 AM  Result Value Ref Range   Glucose-Capillary 228 (H) 65 - 99 mg/dL  Glucose, capillary     Status: Abnormal   Collection Time: 10/22/15  1:51 AM  Result Value Ref Range   Glucose-Capillary 200 (H) 65 - 99 mg/dL  Glucose, capillary     Status: Abnormal   Collection Time: 10/22/15  4:01 AM  Result Value Ref Range   Glucose-Capillary 245 (H) 65 - 99 mg/dL  Basic metabolic panel     Status: Abnormal   Collection Time: 10/22/15  5:00 AM  Result Value Ref Range   Sodium 136 135 - 145 mmol/L   Potassium 3.8 3.5 - 5.1 mmol/L   Chloride 104 101 - 111 mmol/L    CO2 25 22 - 32 mmol/L   Glucose, Bld 254 (H) 65 - 99 mg/dL   BUN <5 (L) 6 - 20 mg/dL   Creatinine, Ser 0.56 0.44 - 1.00 mg/dL   Calcium 8.5 (L) 8.9 - 10.3 mg/dL   GFR calc non Af Amer >60 >60 mL/min   GFR calc Af Amer >60 >60 mL/min    Comment: (NOTE) The eGFR has been calculated using the CKD EPI equation. This calculation has not been validated in all clinical situations. eGFR's persistently <60 mL/min signify possible Chronic Kidney Disease.    Anion gap 7 5 - 15  CBC     Status: None   Collection Time: 10/22/15  5:00 AM  Result Value Ref Range   WBC 8.2 3.6 - 11.0 K/uL   RBC 4.64 3.80 - 5.20 MIL/uL   Hemoglobin 14.6 12.0 - 16.0 g/dL   HCT 41.6 35.0 - 47.0 %  MCV 89.7 80.0 - 100.0 fL   MCH 31.4 26.0 - 34.0 pg   MCHC 35.0 32.0 - 36.0 g/dL   RDW 12.7 11.5 - 14.5 %   Platelets 199 150 - 440 K/uL  Glucose, capillary     Status: Abnormal   Collection Time: 10/22/15  6:04 AM  Result Value Ref Range   Glucose-Capillary 248 (H) 65 - 99 mg/dL  Glucose, capillary     Status: Abnormal   Collection Time: 10/22/15  7:52 AM  Result Value Ref Range   Glucose-Capillary 249 (H) 65 - 99 mg/dL  Glucose, capillary     Status: Abnormal   Collection Time: 10/22/15 10:05 AM  Result Value Ref Range   Glucose-Capillary 313 (H) 65 - 99 mg/dL   Comment 1 Notify RN   Glucose, capillary     Status: Abnormal   Collection Time: 10/22/15 12:16 PM  Result Value Ref Range   Glucose-Capillary 270 (H) 65 - 99 mg/dL   Comment 1 Notify RN     Current Facility-Administered Medications  Medication Dose Route Frequency Provider Last Rate Last Dose  . enoxaparin (LOVENOX) injection 40 mg  40 mg Subcutaneous Q24H Loletha Grayer, MD      . insulin aspart (novoLOG) injection 0-15 Units  0-15 Units Subcutaneous TID WC Dustin Flock, MD      . insulin glargine (LANTUS) injection 20 Units  20 Units Subcutaneous Daily Dustin Flock, MD      . ondansetron (ZOFRAN) tablet 4 mg  4 mg Oral Q6H PRN Loletha Grayer, MD       Or  . ondansetron (ZOFRAN) injection 4 mg  4 mg Intravenous Q6H PRN Loletha Grayer, MD        Musculoskeletal: Strength & Muscle Tone: decreased Gait & Station: unsteady Patient leans: N/A  Psychiatric Specialty Exam: Physical Exam  Nursing note and vitals reviewed. Constitutional: She appears well-developed and well-nourished.  HENT:  Head: Normocephalic and atraumatic.  Eyes: Conjunctivae are normal. Pupils are equal, round, and reactive to light.  Neck: Normal range of motion.  Cardiovascular: Normal rate and normal heart sounds.   Respiratory: Effort normal. No respiratory distress.  GI: Soft.  Musculoskeletal: Normal range of motion.  Neurological: She is alert.  Skin: Skin is warm and dry.  Psychiatric: Her affect is blunt. Her speech is delayed. She is slowed and withdrawn. She expresses impulsivity. She exhibits a depressed mood. She expresses suicidal ideation. She exhibits abnormal recent memory.    Review of Systems  Constitutional: Negative.   HENT: Negative.   Eyes: Negative.   Respiratory: Negative.   Cardiovascular: Negative.   Gastrointestinal: Negative.   Musculoskeletal: Negative.   Skin: Negative.   Neurological: Negative.   Psychiatric/Behavioral: Positive for depression, memory loss and suicidal ideas. Negative for hallucinations and substance abuse. The patient is nervous/anxious and has insomnia.     Blood pressure (!) 141/90, pulse 94, temperature 98 F (36.7 C), temperature source Oral, resp. rate 18, height 5' 6"  (1.676 m), weight 89 kg (196 lb 3.2 oz), SpO2 99 %.Body mass index is 31.67 kg/m.  General Appearance: Casual  Eye Contact:  Minimal  Speech:  Slow  Volume:  Decreased  Mood:  Depressed  Affect:  Constricted  Thought Process:  Goal Directed  Orientation:  Full (Time, Place, and Person)  Thought Content:  Tangential  Suicidal Thoughts:  No  Homicidal Thoughts:  No  Memory:  Recent;   Poor Remote;   Poor   Judgement:  Impaired  Insight:  Lacking  Psychomotor Activity:  Decreased  Concentration:  Concentration: Fair  Recall:  Poor  Fund of Knowledge:  Fair  Language:  Fair  Akathisia:  No  Handed:  Right  AIMS (if indicated):     Assets:  Financial Resources/Insurance Housing Social Support  ADL's:  Intact  Cognition:  Impaired,  Mild  Sleep:        Treatment Plan Summary: Daily contact with patient to assess and evaluate symptoms and progress in treatment, Medication management and Plan 50 year old woman took what appears to obviously be an intentional overdose of insulin. Under a great deal of stress recently. Has a past history of fairly recent other suicide attempts in a known history of mental health problems and a positive family history of suicidality. Multiple factors increasing the risk of dangerous behavior. She is under IVC and will be transferred to the psychiatric ward. Patient does not know what medicine she is supposed to be taking for blood pressure cholesterol or depression. She will be continued on the insulin regimen ordered here on the medicine unit. Might need a consult from medicine if the sugars are unstable or if he can't remember other medication she is supposed to take. Continue 15 minute checks downstairs. Case reviewed with TTS and nursing. She can be transferred as soon as she is ready for discharge from the medicine service. Patient has already declined transfer to the [facility].  Disposition: Recommend psychiatric Inpatient admission when medically cleared. Supportive therapy provided about ongoing stressors.  Alethia Berthold, MD 10/22/2015 2:09 PM

## 2015-10-22 NOTE — Plan of Care (Signed)
Problem: Education: Goal: Knowledge of [facility] Education information/materials will improve Outcome: Progressing Pt likes to be called Destiny Palmer  Past Medical History:  Diagnosis Date  . Diabetes mellitus without complication ([facility])   . Hypertension

## 2015-10-22 NOTE — Progress Notes (Signed)
Admission Note:  D: Pt appeared depressed  With  a flat affect.   A: Pt admitted to unit per protocol, skin assessment and search done intact  and no contraband found.  Pt  educated on therapeutic milieu rules. Pt was introduced to milieu by nursing staff.    R: Pt was receptive to education about the milieu .  15 min safety checks started. Clinical research associate offered support

## 2015-10-22 NOTE — Plan of Care (Signed)
Pt being transferred down to Behavioral Med. She is A&O.  D10 drip d/ced and blood sugars have been in mid 200's.  She is not eating consistently.  Dr put her on sliding scale AC/HS s/20u of Lantus daily.  Pt ambulates independently to BR.  Called report to behavioral med. Sitter, security and I will escort her downstairs.

## 2015-10-22 NOTE — Progress Notes (Signed)
Received a call from poison control, spoke with Alona Bene regarding pt's status. Continue to monitor.

## 2015-10-22 NOTE — Care Management (Signed)
Admitted to [facility] with the diagnosis of hypoglycemia. Lives with niece Lia Foyer [phone]). Last seen Dr. Nolene Ebbs Alourent at [facility] about 2 months ago. Takes care of all basic and instrumental activities of daily living herself. Refusal of Transfer to [facility] signed and faxed to [facility]. Copy placed on chart. Gwenette Greet RN MSN CCM Care Management [phone]

## 2015-10-22 NOTE — Progress Notes (Signed)
[facility] [place] [facility]                                                                                                                                                                                            Patient Demographics   Destiny Palmer, is a 50 y.o. female, DOB - 06-Oct-1965, WUJ:[id number]  Admit date - 10/21/2015   Admitting Physician Alford Highland, MD  Outpatient Primary MD for the patient is [facility]   LOS - 1  Subjective:Patient admitted with ingestion of excessive insulin When questioned about her insulin use she reports that her sugars are usually high and she usually uses shorter acting insulin and not Lantus most of the time. But she is not very clear and engaging. She refuses to answer some questions. She denies any suicidal ideation or homicidal ideation     Review of Systems:   CONSTITUTIONAL: No documented fever. No fatigue, weakness. No weight gain, no weight loss.  EYES: No blurry or double vision.  ENT: No tinnitus. No postnasal drip. No redness of the oropharynx.  RESPIRATORY: No cough, no wheeze, no hemoptysis. No dyspnea.  CARDIOVASCULAR: No chest pain. No orthopnea. No palpitations. No syncope.  GASTROINTESTINAL: No nausea, no vomiting or diarrhea. No abdominal pain. No melena or hematochezia.  GENITOURINARY: No dysuria or hematuria.  ENDOCRINE: No polyuria or nocturia. No heat or cold intolerance.  HEMATOLOGY: No anemia. No bruising. No bleeding.  INTEGUMENTARY: No rashes. No lesions.  MUSCULOSKELETAL: No arthritis. No swelling. No gout.  NEUROLOGIC: No numbness, tingling, or ataxia. No seizure-type activity.  PSYCHIATRIC: No anxiety. No insomnia. No ADD.    Vitals:   Vitals:   10/21/15 1839 10/21/15 2156 10/22/15 0605 10/22/15 1005  BP: 113/63 118/62 124/79 (!) 141/90  Pulse: 87 70 83 94  Resp: Temp: 98.6 F (37 C) 97.8 F (36.6 C) 97.5 F (36.4 C) 98 F (36.7 C)  TempSrc: Oral  Oral Oral Oral  SpO2: 100% 100% 100% 99%  Weight: 89 kg (196 lb 3.2 oz)     Height:  (1.676 m)       Wt Readings from Last 3 Encounters:  10/21/15 89 kg (196 lb 3.2 oz)     Intake/Output Summary (Last 24 hours) at 10/22/15 1330 Last data filed at 10/22/15 0626  Gross per 24 hour  Intake             1126 ml  Output                0 ml  Net  1126 ml    Physical Exam:   GENERAL: Pleasant-appearing in no apparent distress.  HEAD, EYES, EARS, NOSE AND THROAT: Atraumatic, normocephalic. Extraocular muscles are intact. Pupils equal and reactive to light. Sclerae anicteric. No conjunctival injection. No oro-pharyngeal erythema.  NECK: Supple. There is no jugular venous distention. No bruits, no lymphadenopathy, no thyromegaly.  HEART: Regular rate and rhythm,. No murmurs, no rubs, no clicks.  LUNGS: Clear to auscultation bilaterally. No rales or rhonchi. No wheezes.  ABDOMEN: Soft, flat, nontender, nondistended. Has good bowel sounds. No hepatosplenomegaly appreciated.  EXTREMITIES: No evidence of any cyanosis, clubbing, or peripheral edema.  +2 pedal and radial pulses bilaterally.  NEUROLOGIC: The patient is alert, awake, and oriented x3 with no focal motor or sensory deficits appreciated bilaterally.  SKIN: Moist and warm with no rashes appreciated.  Psych: Not anxious, depressed LN: No inguinal LN enlargement    Antibiotics   Anti-infectives    None      Medications   Scheduled Meds: . enoxaparin (LOVENOX) injection  40 mg Subcutaneous Q24H  . insulin aspart  0-15 Units Subcutaneous TID WC  . insulin glargine  20 Units Subcutaneous Daily   Continuous Infusions:  PRN Meds:.ondansetron **OR** ondansetron (ZOFRAN) IV   Data Review:   Micro Results No results found for this or any previous visit (from the past 240 hour(s)).  Radiology Reports Dg Chest 1 View  Result Date: 10/21/2015 CLINICAL DATA:  Overdose.  Tachycardia. EXAM: CHEST 1 VIEW  COMPARISON:  None. FINDINGS: The cardiomediastinal silhouette is within normal limits. No airspace consolidation, edema, pleural effusion, or pneumothorax is identified. No acute osseous abnormality is seen. IMPRESSION: No active disease. Electronically Signed   By: Sebastian Ache M.D.   On: 10/21/2015 13:46     CBC  Recent Labs Lab 10/21/15 1219 10/22/15 0500  WBC 9.5 8.2  HGB 15.4 14.6  HCT 43.9 41.6  PLT 252 199  MCV 89.3 89.7  MCH 31.3 31.4  MCHC 35.0 35.0  RDW 13.1 12.7  LYMPHSABS 1.6  --   MONOABS 0.4  --   EOSABS 0.2  --   BASOSABS 0.0  --     Chemistries   Recent Labs Lab 10/21/15 1219 10/22/15 0500  NA 133* 136  K 4.8 3.8  CL 101 104  CO2 23 25  GLUCOSE 317* 254*  BUN 14 <5*  CREATININE 0.74 0.56  CALCIUM 9.1 8.5*  AST 39  --   ALT 24  --   ALKPHOS 91  --   BILITOT 1.1  --    ------------------------------------------------------------------------------------------------------------------ estimated creatinine clearance is 93.5 mL/min (by C-G formula based on SCr of 0.8 mg/dL). ------------------------------------------------------------------------------------------------------------------ No results for input(s): HGBA1C in the last 72 hours. ------------------------------------------------------------------------------------------------------------------ No results for input(s): CHOL, HDL, LDLCALC, TRIG, CHOLHDL, LDLDIRECT in the last 72 hours. ------------------------------------------------------------------------------------------------------------------ No results for input(s): TSH, T4TOTAL, T3FREE, THYROIDAB in the last 72 hours.  Invalid input(s): FREET3 ------------------------------------------------------------------------------------------------------------------ No results for input(s): VITAMINB12, FOLATE, FERRITIN, TIBC, IRON, RETICCTPCT in the last 72 hours.  Coagulation profile No results for input(s): INR, PROTIME in the last 168  hours.  No results for input(s): DDIMER in the last 72 hours.  Cardiac Enzymes No results for input(s): CKMB, TROPONINI, MYOGLOBIN in the last 168 hours.  Invalid input(s): CK ------------------------------------------------------------------------------------------------------------------ Invalid input(s): POCBNP    Assessment & Plan  Patient is a 50 year old admitted with hypoglycemia 1. Hypoglycemia in a diabetic patient  Unclear if this is intentional misuse of medication Blood sugars are now elevated I  will place patient on sliding scale insulin Place her on Lantus Psychiatry evaluation for possible underlying psychiatric disorder leading to current presentation  2. . Essential hypertension. I will go ahead and start patient on an ACE inhibitor 3.  Hyponatremia. Resolved with IV fluids 4. Miscellaneous Lovenox for DVT prophylaxis     Code Status Orders        Start     Ordered   10/21/15 1658  Full code  Continuous     10/21/15 1658    Code Status History    Date Active Date Inactive Code Status Order ID Comments User Context   This patient has a current code status but no historical code status.    Advance Directive Documentation   Flowsheet Row Most Recent Value  Type of Advance Directive  Living will  Pre-existing out of facility DNR order (yellow form or pink MOST form)  No data  "MOST" Form in Place?  No data           Consults Psychiatry DVT Prophylaxis  Lovenox  Lab Results  Component Value Date   PLT 199 10/22/2015     Time Spent in minutes   Greater than 50% of time spent in care coordination and counseling patient regarding the condition and plan of care.   Auburn Bilberry M.D on 10/22/2015 at 1:30 PM  Between 7am to 6pm - Pager - [phone]  After 6pm go to [email or web address] - password [email or web address] [facility]  [facility]   Office  [phone]

## 2015-10-23 ENCOUNTER — Encounter: Payer: Self-pay | Admitting: Psychiatry

## 2015-10-23 DIAGNOSIS — F4310 Post-traumatic stress disorder, unspecified: Principal | ICD-10-CM

## 2015-10-23 LAB — GLUCOSE, CAPILLARY
GLUCOSE-CAPILLARY: 186 mg/dL — AB (ref 65–99)
GLUCOSE-CAPILLARY: 176 mg/dL — AB (ref 65–99)
Glucose-Capillary: 258 mg/dL — ABNORMAL HIGH (ref 65–99)
Glucose-Capillary: 226 mg/dL — ABNORMAL HIGH (ref 65–99)

## 2015-10-23 MED ORDER — FLUOXETINE HCL 20 MG PO CAPS
40.0000 mg | ORAL_CAPSULE | Freq: Every day | ORAL | Status: DC
  Administered 2015-10-23 – 2015-10-27 (×5): 40 mg via ORAL
  Filled 2015-10-23 (×5): qty 2

## 2015-10-23 MED ORDER — LORAZEPAM 1 MG PO TABS
1.0000 mg | ORAL_TABLET | Freq: Every day | ORAL | Status: DC
  Administered 2015-10-23 – 2015-10-26 (×4): 1 mg via ORAL
  Filled 2015-10-23 (×4): qty 1

## 2015-10-23 MED ORDER — INSULIN ASPART 100 UNIT/ML ~~LOC~~ SOLN: 0.0000 [IU] | Freq: Three times a day (TID) | SUBCUTANEOUS | Status: DC

## 2015-10-23 MED ORDER — INSULIN ASPART 100 UNIT/ML ~~LOC~~ SOLN
3.0000 [IU] | Freq: Three times a day (TID) | SUBCUTANEOUS | Status: DC
Start: 2015-10-23 — End: 2015-10-27
  Administered 2015-10-23 – 2015-10-27 (×13): 3 [IU] via SUBCUTANEOUS
  Filled 2015-10-23 (×9): qty 3

## 2015-10-23 MED ORDER — INSULIN ASPART 100 UNIT/ML ~~LOC~~ SOLN
0.0000 [IU] | Freq: Every day | SUBCUTANEOUS | Status: DC
  Administered 2015-10-25: 3 [IU] via SUBCUTANEOUS
  Administered 2015-10-26: 2 [IU] via SUBCUTANEOUS
  Filled 2015-10-23: qty 3
  Filled 2015-10-23: qty 2

## 2015-10-23 MED ORDER — HYDROXYZINE HCL 25 MG PO TABS
25.0000 mg | ORAL_TABLET | Freq: Three times a day (TID) | ORAL | Status: DC | PRN
  Administered 2015-10-23 – 2015-10-24 (×2): 25 mg via ORAL
  Filled 2015-10-23 (×2): qty 1
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
## 2015-10-23 NOTE — BHH Counselor (Signed)
Adult Comprehensive Assessment  Patient ID: Destiny Palmer, female   DOB: Aug 03, 1965, 50 y.o.   MRN: [id number]  Information Source: Information source: Patient  Current Stressors:  Educational / Learning stressors: Pt reports needing extra assistance in school.  Employment / Job issues: Pt is unemployed and received [employer] pension for a service connected injury.  Family Relationships: Pt states that she does not communicate with her family besides her niece that resides with her.  Financial / Lack of resources (include bankruptcy): n/a, although Pt states due to her recent issues with the IRS, she owes them over $5000 dollars.  Housing / Lack of housing: n/a Physical health (include injuries & life threatening diseases): n/a Social relationships: n/a Substance abuse: n/a Bereavement / Loss: n/a  Living/Environment/Situation:  Living Arrangements: Other relatives Living conditions (as described by patient or guardian): Pt lives with her niece. Pt reports no complaints or concerns about her current living situation.  How long has patient lived in current situation?: Since March 2016.  What is atmosphere in current home: Comfortable  Family History:  Marital status: Single Are you sexually active?:  (Pt did not answer.) What is your sexual orientation?: Pt did not answer.  Has your sexual activity been affected by drugs, alcohol, medication, or emotional stress?: n/a Does patient have children?: Yes How many children?: 1 How is patient's relationship with their children?: Pt does not have current relationship with son due to most recent time in jail.   Childhood History:  By whom was/is the patient raised?: Both parents (Pt's father left when pt was 5.) Description of patient's relationship with caregiver when they were a child: Pt stated "It was pretty good". Patient's description of current relationship with people who raised him/her: Pt reports relationship with mother is not good  due to recent disagreements concerning pt's finances. Pt's father is deceased.  How were you disciplined when you got in trouble as a child/adolescent?: n/a Does patient have siblings?: Yes Number of Siblings: 2 Description of patient's current relationship with siblings: Pt states "I do not have a good relationship with them".  Did patient suffer any verbal/emotional/physical/sexual abuse as a child?: No Did patient suffer from severe childhood neglect?: No Has patient ever been sexually abused/assaulted/raped as an adolescent or adult?: No Was the patient ever a victim of a crime or a disaster?: No Witnessed domestic violence?: No Has patient been effected by domestic violence as an adult?: No  Education:  Highest grade of school patient has completed: 12th grade Learning disability?: Yes What learning problems does patient have?: Pt reports having learning difficulties in school.   Employment/Work Situation:   Employment situation: Unemployed (Pt receives service connected pension from [place]) Patient's job has been impacted by current illness: No What is the longest time patient has a held a job?: Pt did not answer.  Where was the patient employed at that time?: Pt did not answer. 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 ) Has patient ever served in combat?: Yes Patient description of combat service: Pt chose not answer. Did You Receive Any Psychiatric Treatment/Services While in the Military?: No Are There Guns or Other Weapons in Your Home?: No (Patient denies)  Architect:   Financial resources: Insurance claims handler (VA pension) Does patient have a Lawyer or guardian?: No  Alcohol/Substance Abuse:   What has been your use of drugs/alcohol within the last 12 months?: Patient  denies If attempted suicide, did drugs/alcohol play a role in this?: No Alcohol/Substance Abuse Treatment  Hx: Denies past history Has alcohol/substance abuse ever caused legal problems?: No (Patient denies)  Social Support System:   Patient's Community Support System: Poor Describe Community Support System: Pt only communicates with niece who lives with her.  Type of faith/religion: n/a How does patient's faith help to cope with current illness?: n/a  Leisure/Recreation:   Leisure and Hobbies: Pt did not answer.  Strengths/Needs:   What things does the patient do well?: Pt did not answer. In what areas does patient struggle / problems for patient: Pt has legal problems.   Discharge Plan:   Does patient have access to transportation?: Yes (Pt reports Niece, Destiny Palmer will be her ride at discharge. ) Will patient be returning to same living situation after discharge?: Yes Currently receiving community mental health services: Yes (From Whom) (Pt reports receiving services from the [facility] center) Does patient have financial barriers related to discharge medications?: No  Summary/Recommendations:   Patient is a 50 year old female admitted with a diagnosis of suicide attempt, PTSD, and diabetes. Information was obtained from the patient and chart review. Patient presented to the hospital after a suicide attempt of insulin. Patient reports primary triggers for admission were being stressed out due to recent life events of owing the IRS money. Patient also reports, "I had high sugar" and patient reports she was taking her prescribed dosage of insulin.  Patient will benefit from crisis stabilization, medication evaluation, group therapy and psycho education in addition to case management for discharge. At discharge, it is recommended that patient remain compliant with established discharge plan and continued treatment.    Zavannah Deblois G. Garnette Czech MSW, [facility]  10/23/2015 2:59 PM

## 2015-10-23 NOTE — Progress Notes (Signed)
Patient ID: Destiny Palmer, female   DOB: 1966/02/21, 50 y.o.   MRN: [id number]   CSW attempted family contact with patients niece, Lia Foyer at [phone]; [phone]. CSW left voicemail for niece. CSW will attempt contact again.   Sherlock Nancarrow G. Garnette Czech MSW, [facility] 10/23/2015 3:39 PM

## 2015-10-23 NOTE — Plan of Care (Signed)
Problem: Self-Concept: Goal: Ability to identify factors that promote anxiety will improve Outcome: Not Progressing Patient not able to identify what causes anxiety at this time [facility] RN

## 2015-10-23 NOTE — Progress Notes (Signed)
D:  MHT presented to nurses station and states "Does Marchita have anything ordered for her nerves she is sitting in her room crying.  Looks like she has a string in her hand but she will not let me see."  Went in room to talk with patient.  Patient would not acknowledge this Clinical research associate.  String noted to partially be sticking out of her hand.  Asked patient what it was in her hand.  Still the patient would not acknowledge this Clinical research associate.  Informed patient that she would need to give me the string and patient still would not acknowledge this Clinical research associate. Asked for charge nurse and security to come with me for show of support.  Reluctantly patient gave up string.  String was taken from one of her shoes.  Informed patient that she could not keep her shoes and that I would be locking them up.

## 2015-10-23 NOTE — Discharge Summary (Signed)
Destiny Palmer, 50 y.o., DOB 12-Mar-1966, MRN [id number]. Admission date: 10/22/2015 Discharge Date 10/23/2015 Primary MD [facility] Admitting Physician No admitting provider for patient encounter.  Admission Diagnosis  Depression   Discharge Diagnosis   Active Problems:  Overdose on insulin Suicide attempt PTSD History of hypoglycemia in the past         Hospital Course patient is a 50 year old female presented to the ED after taking 200 units of insulin. Patient reported that her sugars reading was elevated. At the time of the admission patient was very stressed out and overwhelmed at that time. Patient states that she does not usually take a longer acting insulin. She was noted to have hypoglycemia in the emergency room. And admitted to the hospital placed on a D10 containing IV fluids. Her sugars stabilized overnight. She had to be restarted on her insulin. She was seen in consultation by psychiatry who felt that she needed inpatient psychiatric admission so she is being transferred to the psychiatric ward.             Consults  psychiatry  Significant Tests:  See full reports for all details     Dg Chest 1 View  Result Date: 10/21/2015 CLINICAL DATA:  Overdose.  Tachycardia. EXAM: CHEST 1 VIEW COMPARISON:  None. FINDINGS: The cardiomediastinal silhouette is within normal limits. No airspace consolidation, edema, pleural effusion, or pneumothorax is identified. No acute osseous abnormality is seen. IMPRESSION: No active disease. Electronically Signed   By: Sebastian Ache M.D.   On: 10/21/2015 13:46       Today   Subjective:   Destiny Palmer  feeling depressed however denies any other complaints  Objective:   Blood pressure 134/89, pulse (!) 102, temperature 97.9 F (36.6 C), temperature source Oral, resp. rate 18, height  (1.651 m), weight 85.7 kg (189 lb), SpO2 100 %.  . No intake or output data in the 24 hours ending 10/23/15 0803  Exam VITAL SIGNS:  Blood pressure 134/89, pulse (!) 102, temperature 97.9 F (36.6 C), temperature source Oral, resp. rate 18, height  (1.651 m), weight 85.7 kg (189 lb), SpO2 100 %.  GENERAL:  50 y.o.-year-old patient lying in the bed with no acute distress.  EYES: Pupils equal, round, reactive to light and accommodation. No scleral icterus. Extraocular muscles intact.  HEENT: Head atraumatic, normocephalic. Oropharynx and nasopharynx clear.  NECK:  Supple, no jugular venous distention. No thyroid enlargement, no tenderness.  LUNGS: Normal breath sounds bilaterally, no wheezing, rales,rhonchi or crepitation. No use of accessory muscles of respiration.  CARDIOVASCULAR: S1, S2 normal. No murmurs, rubs, or gallops.  ABDOMEN: Soft, nontender, nondistended. Bowel sounds present. No organomegaly or mass.  EXTREMITIES: No pedal edema, cyanosis, or clubbing.  NEUROLOGIC: Cranial nerves II through XII are intact. Muscle strength 5/5 in all extremities. Sensation intact. Gait not checked.  PSYCHIATRIC: The patient is alert and oriented x 3.  SKIN: No obvious rash, lesion, or ulcer.   Data Review     CBC w Diff:  Lab Results  Component Value Date   WBC 8.2 10/22/2015   HGB 14.6 10/22/2015   HCT 41.6 10/22/2015   PLT 199 10/22/2015   LYMPHOPCT 17 10/21/2015   MONOPCT 5 10/21/2015   EOSPCT 2 10/21/2015   BASOPCT 0 10/21/2015   CMP:  Lab Results  Component Value Date   NA 136 10/22/2015   K 3.8 10/22/2015   CL 104 10/22/2015   CO2 25 10/22/2015   BUN <5 (  L) 10/22/2015   CREATININE 0.56 10/22/2015   PROT 7.4 10/21/2015   ALBUMIN 3.8 10/21/2015   BILITOT 1.1 10/21/2015   ALKPHOS 91 10/21/2015   AST 39 10/21/2015   ALT 24 10/21/2015  .  Micro Results No results found for this or any previous visit (from the past 240 hour(s)).      Code Status Orders        Start     Ordered   10/22/15 2135  Full code  Continuous     10/22/15 2134    Code Status History    Date Active Date Inactive  Code Status Order ID Comments User Context   10/22/2015  9:34 PM  Full Code [id number]  Audery Amel, MD Inpatient   10/21/2015  4:58 PM 10/22/2015  6:14 PM Full Code [id number]  Alford Highland, MD ED            Discharge Medications   Please see discharge medication list she is being transferred to psychiatry to be continued on same medications       Total Time in preparing paper work, data evaluation and todays exam - 35 minutes  Auburn Bilberry M.D on 10/23/2015 at 8:03 AM  [facility]   Office  [phone]

## 2015-10-23 NOTE — BHH Group Notes (Signed)
Goals Group  Date/Time: 10/23/2015 9am  Type of Therapy and Topic: Group Therapy: Goals Group: SMART Goals   Pt was called, but did not attend   Leonie Amacher F. Pearlina Friedly, LCSWA, LCAS  

## 2015-10-23 NOTE — Progress Notes (Signed)
Recreation Therapy Notes  Date: 08.03.17 Time: 1:00 pm Location: Craft Room  Group Topic: Leisure Education  Goal Area(s) Addresses:  Patient will identify activities for each letter of the alphabet. Patient will verbalize ability to use leisure as a Associate Professor.  Behavioral Response: Did not attend  Intervention: Leisure Alphabet  Activity: Patients were given a Leisure Information systems manager and instructed to write healthy leisure activities for each letter of the alphabet.  Education: LRT educated patients on what they need to participate in leisure.  Education Outcome: Patient did not attend group.  Clinical Observations/Feedback: Patient did not attend group.  Jacquelynn Cree, LRT/CTRS 10/23/2015 1:43 PM

## 2015-10-23 NOTE — Plan of Care (Signed)
Problem: Coping: Goal: Ability to identify and develop effective coping behavior will improve Outcome: Not Progressing Not forthcoming with information.  Encouraged to discuss feelings.  Tearful

## 2015-10-23 NOTE — Tx Team (Signed)
Interdisciplinary Treatment Plan Update (Adult)  Date:  10/23/2015 Time Reviewed:  3:20 PM  Progress in Treatment: Attending groups: No. Participating in groups:  No. Taking medication as prescribed:  Yes Tolerating medication:  Yes Family/Significant othe contact made:  No, will contact:  Pt's niece Alisa Graff [phone]; [phone]. Patient understands diagnosis:  No. Discussing patient identified problems/goals with staff:  No. Medical problems stabilized or resolved:  Yes. Denies suicidal/homicidal ideation: Yes. Issues/concerns per patient self-inventory:  No. Other:  New problem(s) identified: n/a  Discharge Plan or Barriers: Patient will be discharge back to her home with follow-up care with [facility].   Reason for Continuation of Hospitalization: Medical Issues Medication stabilization Suicidal ideation  Comments: n/a  Estimated length of stay: 3 to 5 days  New goal(s): n/a  Review of initial/current patient goals per problem list:   1.? Goal(s): Patient will participate in aftercare plan o Met:?No  o Target date: at discharge  o As evidenced by: Patient will participate within aftercare plan AEB aftercare provider and housing plan at discharge being identified.  ? 2.? Goal (s): Patient will exhibit decreased depressive symptoms and suicidal ideations. o Met:?No  o ?Target date: at discharge  o As evidenced by: Patient will utilize self rating of depression at 3 or below and demonstrate decreased signs of depression or be deemed stable for discharge by MD.   Attendees: Patient:  Destiny Palmer 8/3/20173:20 PM  Family:   8/3/20173:20 PM  Physician:  Dr. Bary Leriche, MD 8/3/20173:20 PM  Nursing:   Floyde Parkins, RN 8/3/20173:20 PM  Case Manager:   8/3/20173:20 PM  Counselor:   8/3/20173:20 PM  Other:  Lear Ng. Claybon Jabs MSW, LCSWA 8/3/20173:20 PM  Other:  Everitt Amber, RT 8/3/20173:20 PM  Other:   8/3/20173:20 PM  Other:  8/3/20173:20 PM   Other:  8/3/20173:20 PM  Other:  8/3/20173:20 PM  Other:  8/3/20173:20 PM  Other:  8/3/20173:20 PM  Other:  8/3/20173:20 PM  Other:   8/3/20173:20 PM   Scribe for Treatment Team:   Lear Ng. [place], [place], 10/23/2015, 3:30 PM

## 2015-10-23 NOTE — Tx Team (Signed)
Initial Interdisciplinary Treatment Plan   PATIENT STRESSORS: Financial Family stressors   PATIENT STRENGTHS: Willingness to improve with treatment complience with medications   PROBLEM LIST: Problem List/Patient Goals Date to be addressed Date deferred Reason deferred Estimated date of resolution  PTSD 10/23/15   10/28/15  ANXIETY 10/23/15   10/28/15  'reduce anxiety"  10/23/15   10/28/15  'reduce stressors" 10/23/15   10/28/15                                 DISCHARGE CRITERIA:  complience with medications complience with aftercare Family therapy  PRELIMINARY DISCHARGE PLAN: Attend aftercare/continuing care group Participate in family therapy Return to previous living arrangement  PATIENT/FAMIILY INVOLVEMENT: This treatment plan has been presented to and reviewed with the patient, Destiny Palmer, and/or family member,  The patient and family have been given the opportunity to ask questions and make suggestions.  Hillery Jacks 10/23/2015, 12:18 AM
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
## 2015-10-23 NOTE — Progress Notes (Signed)
Inpatient Diabetes Program Recommendations  AACE/ADA: New Consensus Statement on Inpatient Glycemic Control (2015)  Target Ranges:  Prepandial:   less than 140 mg/dL      Peak postprandial:   less than 180 mg/dL (1-2 hours)      Critically ill patients:  140 - 180 mg/dL   Lab Results  Component Value Date   GLUCAP 258 (H) 10/23/2015   HGBA1C 9.0 (H) 10/21/2015    Review of Glycemic Control  Results for Destiny Palmer, Destiny Palmer (MRN [id number]) as of 10/23/2015 12:00  Ref. Range 10/22/2015 10:05 10/22/2015 12:16 10/22/2015 16:24 10/22/2015 21:38 10/23/2015 06:37 10/23/2015 11:51  Glucose-Capillary Latest Ref Range: 65 - 99 mg/dL 353 (H) 614 (H) 431 (H) 244 (H) 186 (H) 258 (H)    Diabetes history: Type 2 diabetes Outpatient Diabetes medications: unable to determine Current orders for Inpatient glycemic control: Lantus 20 units qhs, Novolog moderate correction scale  Inpatient Diabetes Program Recommendations:     Lantus insulin 20 units given at 1507 yesterday and then again today at 0918. Continue Lantus insulin once a day as ordered.   Novolog correction held this am because patient told RN that she had received it from the night nurse.     Consider starting Novolog 3 units tid with meals- continue Novolog sliding scale tid as ordered.    Consider adding Novolog correction 0-5 units qhs.  Spoke to [employer] who will discuss it with MD who is rounding with patients currently.   Susette Racer, RN, BA, MHA, CDE Diabetes Coordinator Inpatient Diabetes Program  [phone] (Team Pager) [phone] [facility] Office) 10/23/2015 12:14 PM

## 2015-10-23 NOTE — B[facility] Group Notes (Signed)
[facility] Group Notes:  (Nursing/MHT/Case Management/Adjunct)  Date:  10/23/2015  Time:  3:01 AM  Type of Therapy:  Group Therapy  Participation Level:  Did Not Attend   Summary of Progress/Problems:  Destiny Palmer 10/23/2015, 3:01 AM

## 2015-10-23 NOTE — [facility] Suicide Risk Assessment (Signed)
[facility] Admission Suicide Risk Assessment   Nursing information obtained from:    Demographic factors:    Current Mental Status:    Loss Factors:    Historical Factors:    Risk Reduction Factors:     Total Time spent with patient: 1 hour Principal Problem: <principal problem not specified> Diagnosis:   Patient Active Problem List   Diagnosis Date Noted  . Suicide attempt (HCC) [T14.91] 10/22/2015  . PTSD (post-traumatic stress disorder) [F43.10] 10/22/2015  . Diabetes mellitus without complication (HCC) [E11.9] 10/22/2015   Subjective Data: Suicide attempt  Continued Clinical Symptoms:  Alcohol Use Disorder Identification Test Final Score (AUDIT): 5 The "Alcohol Use Disorders Identification Test", Guidelines for Use in Primary Care, Second Edition.  World Science writer [facility]). Score between 0-7:  no or low risk or alcohol related problems. Score between 8-15:  moderate risk of alcohol related problems. Score between 16-19:  high risk of alcohol related problems. Score 20 or above:  warrants further diagnostic evaluation for alcohol dependence and treatment.   CLINICAL FACTORS:   Depression:   Impulsivity   Musculoskeletal: Strength & Muscle Tone: within normal limits Gait & Station: normal Patient leans: N/A  Psychiatric Specialty Exam: Physical Exam  Nursing note and vitals reviewed.   Review of Systems  All other systems reviewed and are negative.   Blood pressure 137/83, pulse 84, temperature 98.4 F (36.9 C), temperature source Oral, resp. rate 20, height  (1.651 m), weight 85.7 kg (189 lb), SpO2 99 %.Body mass index is 31.45 kg/m.  General Appearance: Casual  Eye Contact:  Good  Speech:  Clear and Coherent  Volume:  Normal  Mood:  Anxious  Affect:  Appropriate  Thought Process:  Goal Directed  Orientation:  Full (Time, Place, and Person)  Thought Content:  WDL  Suicidal Thoughts:  No  Homicidal Thoughts:  No  Memory:  Immediate;   Fair Recent;    Fair Remote;   Fair  Judgement:  Impaired  Insight:  Shallow  Psychomotor Activity:  Normal  Concentration:  Concentration: Fair and Attention Span: Fair  Recall:  [employer] of Knowledge:  Fair  Language:  Fair  Akathisia:  No  Handed:  Right  AIMS (if indicated):     Assets:  Communication Skills Desire for Improvement Financial Resources/Insurance Housing Resilience Social Support  ADL's:  Intact  Cognition:  WNL  Sleep:  Number of Hours: 8      COGNITIVE FEATURES THAT CONTRIBUTE TO RISK:  None    SUICIDE RISK:   Minimal: No identifiable suicidal ideation.  Patients presenting with no risk factors but with morbid ruminations; may be classified as minimal risk based on the severity of the depressive symptoms   PLAN OF CARE: Hospital admission, medication management, discharge planning.  Ms. Nield is a 50 year old female with a history of PTSD admitted after suicide attempt by insulin overdose in the context of severe social stressors.  1. Suicidal ideation. The patient adamantly denies any thoughts, intentions or plans to hurt herself or others. She is able to contract for safety. She is forward thinking and more optimistic about the future.  2. PTSD. The patient has been treated at the [place]. She does not know the name of her medications. She admits that she has not been compliant for a few days. She does not wish to start medication in the hospital.  3. Diabetes. The patient reportedly had been prescribed metformin, Lantus and NovoLog. She has not been compliant with treatment  and uses only a sliding scale insulin. When her sugars run in the 400s, she tells [place] that she takes 50 units of NovoLog. Per initial report the patient took 200 units prior to admission. It is unclear if she took long or short lasting insulin. There is conflicting information in the chart. We started Lantus 20 units daily. We continue ADA diet and blood glucose monitoring and sliding scale insulin.  Diabetes nurse contribution is greatly appreciated. We added sliding scale at night and 3 units of NovoLog with meals. We'll continue to monitor.  4. Hypertension. The patient reports history of hypertension but does not remember her medication. Vital signs are stable.  5. Dyslipidemia. She reportedly is on cholesterol lowering contact but does not know the name.  6. Insomnia. Trazodone is available.  7. Legal. The patient has legal charges and according to the chart there is an arrest warrant out.  8. Social. The patient is reportedly service-connected above discovered that she currently is not covert by [employer].  9. Disposition. She will be discharged to home. She will follow up with the [place].    I certify that inpatient services furnished can reasonably be expected to improve the patient's condition.  Kristine Linea, MD 10/23/2015, 2:17 PM

## 2015-10-23 NOTE — Progress Notes (Signed)
D:Pt isolated to her room. Pt woke up stating she was not sleep and did not get any sleep. Pt stated she can't sleep. Pt pre-occupied with her sleep and would not engage in any other conversation. A: Pt given support. Pt given medications R: pt taking medication, pt safe on the unit, pt receptive to Tx

## 2015-10-23 NOTE — Progress Notes (Signed)
D: Patient is a 50 y.o.  year-old female admitted to [facility] ambulatory without difficulty. Patient is alert and oriented upon admission. A: Admission assessment completed without difficulty. Skin and contraband assessment completed with no skin abnormalities nor contraband found. Q.15 minute safety checks were implemented at the time of admission. Patient was oriented to the unit and escorted to room  325                                                   R: Patient was receptive to and cooperative with admission assessment. Patient contracts for safety on the unit at this time

## 2015-10-23 NOTE — [facility] Group Notes (Signed)
[facility] LCSW Group Therapy   10/23/2015 1 pm   Type of Therapy: Group Therapy   Participation Level: Active   Participation Quality: Attentive, Sharing and Supportive   Affect: Appropriate   Cognitive: Alert and Oriented   Insight: Developing/Improving and Engaged   Engagement in Therapy: Developing/Improving and Engaged   Modes of Intervention: Clarification, Confrontation, Discussion, Education, Exploration, Limit-setting, Orientation, Problem-solving, Rapport Building, Dance movement psychotherapist, Socialization and Support   Summary of Progress/Problems: The topic for group was balance in life. Today's group focused on defining balance in one's own words, identifying things that can knock one off balance, and exploring healthy ways to maintain balance in life. Group members were asked to provide an example of a time when they felt off balance, describe how they handled that situation, and process healthier ways to regain balance in the future. Group members were asked to share the most important tool for maintaining balance that they learned while at [facility] and how they plan to apply this method after discharge. Pt did not share at length, and did only respond to prompts from the CSW with yes or no answers.  When prompted by the CSW to elaborate the pt would only "shrug her shoulders. Pt presented as depressed and sad.  Destiny Palmer. [name], LCSWA, LCAS  10/23/15

## 2015-10-24 DIAGNOSIS — F4325 Adjustment disorder with mixed disturbance of emotions and conduct: Secondary | ICD-10-CM | POA: Diagnosis present

## 2015-10-24 LAB — GLUCOSE, CAPILLARY
GLUCOSE-CAPILLARY: 240 mg/dL — AB (ref 65–99)
GLUCOSE-CAPILLARY: 248 mg/dL — AB (ref 65–99)
Glucose-Capillary: 251 mg/dL — ABNORMAL HIGH (ref 65–99)
Glucose-Capillary: 252 mg/dL — ABNORMAL HIGH (ref 65–99)
Glucose-Capillary: 130 mg/dL — ABNORMAL HIGH (ref 65–99)

## 2015-10-24 MED ORDER — INSULIN GLARGINE 100 UNIT/ML ~~LOC~~ SOLN
26.0000 [IU] | Freq: Every day | SUBCUTANEOUS | Status: DC
  Administered 2015-10-25 – 2015-10-27 (×3): 26 [IU] via SUBCUTANEOUS
  Filled 2015-10-24 (×4): qty 0.26

## 2015-10-24 MED ORDER — OXCARBAZEPINE 150 MG PO TABS
150.0000 mg | ORAL_TABLET | Freq: Two times a day (BID) | ORAL | Status: DC
  Administered 2015-10-24 – 2015-10-27 (×7): 150 mg via ORAL
  Filled 2015-10-24 (×7): qty 1

## 2015-10-24 MED ORDER — RISPERIDONE 1 MG PO TABS
2.0000 mg | ORAL_TABLET | Freq: Every day | ORAL | Status: DC
  Administered 2015-10-24 – 2015-10-26 (×3): 2 mg via ORAL
  Filled 2015-10-24 (×3): qty 2

## 2015-10-24 NOTE — Plan of Care (Signed)
Problem: Safety: Goal: Ability to remain free from injury will improve Outcome: Progressing Pt safe on the unit at this time   

## 2015-10-24 NOTE — Progress Notes (Signed)
D: More alert and sociable this afternoon. Out of room on unit participating in group activity. Engaged in conversation with nurse and peers. Increase in eye contact. Denies SI/HI.   A: Support and encouragement provided. Group attendance encouraged. Medications administered as ordered. Safety maintained with 15 minute checks.   R: Compliant with medications. Safety maintained. Receptive to treatment.

## 2015-10-24 NOTE — Progress Notes (Signed)
D: Isolated to room, asleep this morning on assessment. After breakfast, returned to room/bed. Reports issues sleeping during the night and feels medication to sleep was administered too late which is causing increased drowsiness this morning. Limited conversation with staff. Denies SI/HI. Refuses to attend group.  A: Support and encouragement provided. Group attendance encouraged. Medications administered as ordered. Safety maintained with 15 minute checks.   R: Compliant with medications. Safety maintained. Receptive to treatment.

## 2015-10-24 NOTE — Progress Notes (Signed)
Recreation Therapy Notes  Date: 08.04.17 Time: 1:00 pm Location: Craft Room  Group Topic: Coping Skills  Goal Area(s) Addresses:  Patient will participate in healthy coping skill. Patient will verbalize benefit of using art as a coping skill.  Behavioral Response: Arrived late, Attentive  Intervention: Coloring  Activity: Patients were given coloring sheets to color and were instructed to think about the emotions they were feeling and what their mind was focused on.  Education: LRT educated patients on healthy coping skills.  Education Outcome: In group clarification offered   Clinical Observations/Feedback: Patient arrived to group at approximately 1:42 pm. Patient colored coloring sheet. Patient did not contribute to group discussion.  Jacquelynn Cree, LRT/CTRS 10/24/2015 2:12 PM

## 2015-10-24 NOTE — Discharge Summary (Deleted)
Physician Discharge Summary Note  Patient:  Destiny Palmer is an 50 y.o., female MRN:  [id number] DOB:  31-Aug-1965 Patient phone:  [phone] (home)  Patient address:   [address] [place] [address],  Total Time spent with patient: 30 minutes  Date of Admission:  10/22/2015 Date of Discharge: 10/24/2015  Reason for Admission:  Suicide attempt.  Identifying data. Destiny Palmer is a 50 year old female with history of PTSD.[name]  Chief complaint. "I had high sugar."  History of present illness. Information was obtained from the patient and the chart. The patient has a history of posttraumatic stress disorder that is combat related. She has been receiving treatment at the [facility] but is unable to name her medications and admits that she has been noncompliant for a few days. She was on the phone with a [facility] representative when she informed her that she just took a higher dose of insulin. That VA worker called police and the patient was brought to the emergency room. According to her records the patient could have possibly taken 200 of insulin, most likely NovoLog. She was briefly admitted to medical floor for monitoring. She was transferred to psychiatry for further treatment. In the emergency room and throughout her hospital stay, the patient consistently denied suicide attempt. She told us that when her sugars are high in the 400s, she takes 50 units of NovoLog. She has not been taking metformin or Lantus lately and uses sliding scale only. The patient apparently has a complicated social and legal situation. She was in jail for 3 months and was released in December 2016. Her case is still in a limbo with two court dates approaching. During that time she sustained some financial losses. She believes that her family was involved. There is a new arrest warrnant for stealing from [employer]. She has been working with the VA to address her legal problems. The patient reports feeling stressed out but denies any  symptoms of depression, anxiety, or psychosis. She denies symptoms suggestive of bipolar mania. She has infrequent panic attacks and some symptoms of PTSD but denies nightmares or flashbacks. She denies alcohol or illicit substance use.  During initial intrview, the patient is very disorganized and possibly paranoid. She is emotional, tearful, angry. She changes her story multiple times.  Past psychiatric history. She denies ever being hospitalized. She sees a Therapist, sports or therapist at the [place]. She has been prescribed medications for PTSD but does not recall the name. She reportedly attempted suicide while in jail.  Family psychiatric history. Multiple family members with substance abuse. Her brother tried to shoot himself.  Social history. 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 We discovered however that her [employer] had lapsed. She lives in a house with a niece. She missed her mortgage payment on the first of this month and is extremely worried that she will lose her housing. There are multiple legal problems that the patient would not elaborate on. We note that there is the "first charge" but there are more charges pending. The patient does have a lawyer who did not show up for court. She works with the [employer] related organization helping her to avoid further jail time. Apparently, there is arrest warrant out.  Principal Problem: PTSD (post-traumatic stress disorder) Discharge Diagnoses: Patient Active Problem List   Diagnosis Date Noted  . PTSD (post-traumatic stress disorder) [F43.10] 10/22/2015    Priority: High  . Adjustment disorder with mixed disturbance of emotions  and conduct [F43.25] 10/24/2015  . Suicide attempt (HCC) [T14.91] 10/22/2015  . Diabetes mellitus without complication (HCC) [E11.9] 10/22/2015    Past Medical History:  Past Medical History:  Diagnosis Date  . Diabetes mellitus without complication (HCC)   . Hypertension     Past  Surgical History:  Procedure Laterality Date  . NO PAST SURGERIES     Family History:  Family History  Problem Relation Age of Onset  . COPD Mother    Social History:  History  Alcohol Use  . Yes     History  Drug Use No    Social History   Social History  . Marital status: Single    Spouse name: N/A  . Number of children: N/A  . Years of education: N/A   Social History Main Topics  . Smoking status: Never Smoker  . Smokeless tobacco: Never Used  . Alcohol use Yes  . Drug use: No  . Sexual activity: Not Asked   Other Topics Concern  . None   Social History Narrative  . None    Hospital Course:    Destiny Palmer is a 49 year old female with a history of PTSD admitted after suicide attempt by insulin overdose in the context of severe social stressors.  1. Suicidal ideation. The patient adamantly denies that she attempted suicide. She however but a string around her neck on the unit again. She is disorganized and unable to contract for safety in the community.   2. Mood and anxiety. The patient has been treated at the [place] with Prozac. We started Trileptal and Risperdal for mood stabilization.   3. Diabetes. The patient reportedly had been prescribed metformin, Lantus and NovoLog. She has not been compliant with treatment and uses only a sliding scale insulin. When her sugars run in the 400s, she tells [place] that she takes 50 units of NovoLog. Per initial report the patient took 200 units prior to admission. It is unclear if she took long or short lasting insulin. There is conflicting information in the chart. Weincreased Lantus to 26 units daily. We continue ADA diet and blood glucose monitoring and sliding scale insulin. Diabetes nurse contribution is greatly appreciated. We added sliding scale at night and 3 units of NovoLog with meals. We'll continue to monitor.  4. Hypertension. The patient reports history of hypertension but does not remember her medication. Vital signs  are stable.  5. Dyslipidemia. She reportedly is on cholesterol lowering contact but does not know the name.  6. Insomnia. Trazodone is available.  7. Legal. The patient has multipl legal charges and arrest warrant out.  8. Social. The patient is VA service-connected. We discovered that she currently is not covered by [employer].  9. Disposition. She will be transferred to the [facility] hospital when bed available. She will follow up with the [place].  Physical Findings: AIMS: Facial and Oral Movements Muscles of Facial Expression: None, normal Lips and Perioral Area: None, normal Jaw: None, normal Tongue: None, normal,Extremity Movements Upper (arms, wrists, hands, fingers): None, normal Lower (legs, knees, ankles, toes): None, normal, Trunk Movements Neck, shoulders, hips: None, normal, Overall Severity Severity of abnormal movements (highest score from questions above): None, normal Incapacitation due to abnormal movements: None, normal Patient's awareness of abnormal movements (rate only patient's report): No Awareness, Dental Status Current problems with teeth and/or dentures?: No Does patient usually wear dentures?: No  CIWA:  CIWA-Ar Total: 1 COWS:  COWS Total Score: 1  Musculoskeletal: Strength & Muscle Tone: within normal  limits Gait & Station: normal Patient leans: N/A  Psychiatric Specialty Exam: Physical Exam  Nursing note and vitals reviewed.   Review of Systems  Psychiatric/Behavioral: Positive for hallucinations and suicidal ideas. The patient has insomnia.     Blood pressure 133/88, pulse (!) 103, temperature 98.4 F (36.9 C), temperature source Oral, resp. rate 20, height 5\' 5"  (1.651 m), weight 85.7 kg (189 lb), SpO2 99 %.Body mass index is 31.45 kg/m.  See SRA.                                                  Sleep:  Number of Hours: 6     Have you used any form of tobacco in the last 30 days? (Cigarettes, Smokeless Tobacco, Cigars,  and/or Pipes): No  Has this patient used any form of tobacco in the last 30 days? (Cigarettes, Smokeless Tobacco, Cigars, and/or Pipes) Yes, No  Blood Alcohol level:  Lab Results  Component Value Date   ETH <5 10/21/2015    Metabolic Disorder Labs:  Lab Results  Component Value Date   HGBA1C 9.0 (H) 10/21/2015   No results found for: PROLACTIN No results found for: CHOL, TRIG, HDL, CHOLHDL, VLDL, LDLCALC  See Psychiatric Specialty Exam and Suicide Risk Assessment completed by Attending Physician prior to discharge.  Discharge destination:  Other:  [facility].  Is patient on multiple antipsychotic therapies at discharge:  No   Has Patient had three or more failed trials of antipsychotic monotherapy by history:  No  Recommended Plan for Multiple Antipsychotic Therapies: NA  Discharge Instructions    Diet - low sodium heart healthy    Complete by:  As directed   Increase activity slowly    Complete by:  As directed       Medication List    You have not been prescribed any medications.      Follow-up recommendations:  Activity:  as tolerated. Diet:  low sodium heart healthy. Other:  keep follow up appointments.  Comments:    Signed: Kristine Linea, MD 10/24/2015, 12:25 PM

## 2015-10-24 NOTE — Plan of Care (Signed)
Problem: [facility] Participation in Recreation Therapeutic Interventions Goal: STG-Patient will demonstrate improved self esteem by identif STG: Self-Esteem - Within 4 treatment sessions, patient will verbalize at least 5 positive affirmation statements in each of 2 treatment sessions to increase self-esteem post d/c.  Outcome: Progressing Treatment Session 1; Completed 1 out of 2: At approximately 11:30 am, LRT met with patient in craft room. Patient verbalized 5 positive affirmation statements. Patient reported it felt "positive". LRT encouraged patient to continue saying positive affirmation statements.  Leonette Monarch, LRT/CTRS 08.04.17 3:20 pm Goal: STG-Other Recreation Therapy Goal (Specify) STG: Stress Management - Within 4 treatment sessions, patient will verbalize understanding of the stress management techniques in each of 2 treatment sessions to increase stress management skills post d/c.  Outcome: Progressing Treatment Session 1; Completed 1 out of 2: At approximately 11:30 am, LRT met with patient in craft room. LRT educated and provided patient with handouts on stress management techniques. Patient verbalized understanding. LRT encouraged patient to read over and practice the stress management techniques.  Leonette Monarch, LRT/CTRS 08.04.17 3:21 pm

## 2015-10-24 NOTE — Progress Notes (Addendum)
[facility] MD Progress Note  10/24/2015 10:13 AM Destiny Palmer  MRN:  [id number]  Subjective:  Destiny Palmer is a 49 year old [employer] veteran with a history of depression and PTSD who was transferred from medical floor where she was briefly hospitalized for hypoglycemia following insulin overdose on 8/1. The patient adamantly denies that it was suicide attempt. Rather she was trying to address blood sugar of over 400. There are conflicting reports of whether she took 200 units oral 50 units of insulin. The patient has been in legal jeopardy for almost a year. These include 3 months of jail time ending in December of 2016 that has still not been resolved. There are also charges of stealing from [facility] for which is warrant is out. The patient is somewhat disorganized and changes her story frequently. It is not impossible, but difficult to establish, that she had manic episode. I attempted all her psychiatrist at the [facility] women psychiatry clinic. Her doctor, Dr. Elijah Birk is in the community on Wednesdays only.   Today Destiny Palmer is hyper focused on her sleep. She had a period of insomnia prior to coming to the hospital and was given 1 mg of Ativan last night. She reports very poor sleep even though staff reported 6 hours. Yesterday the patient was easily distractible and unable to tell us her story. In the afternoon she removed rubber pieces from her shoes and tied them around her neck. She was tearful but eventually felt better and went to group. She was able to provide me with a series of phone numbers. I spoke briefly with Destiny Palmer her niece with whom she lives. The patient is very much interested in calling VA program that could possibly help her with the legal charges. She gave me 2 numbers of Destiny Palmer and Destiny Palmer [phone] and [phone]. With DA permission, she could enter a 2 year program VA program to keep her out of jail.  [facility] has been calling about this patient. They do not have a bed this minute but are interested  in working with her. Yesterday, the patient declined transfer to the [place] and we did not realize we still could do it as she is on IVC. I will complete necessary discharge paperwork just in case bed is available at the later time.   Principal Problem: PTSD (post-traumatic stress disorder) Diagnosis:   Patient Active Problem List   Diagnosis Date Noted  . PTSD (post-traumatic stress disorder) [F43.10] 10/22/2015    Priority: High  . Suicide attempt (HCC) [T14.91] 10/22/2015  . Diabetes mellitus without complication (HCC) [E11.9] 10/22/2015   Total Time spent with patient: 20 minutes  Past Psychiatric History:  PTSD. Past Medical History:  Past Medical History:  Diagnosis Date  . Diabetes mellitus without complication (HCC)   . Hypertension     Past Surgical History:  Procedure Laterality Date  . NO PAST SURGERIES     Family History:  Family History  Problem Relation Age of Onset  . COPD Mother    Family Psychiatric  History: See H&P.  Social History:  History  Alcohol Use  . Yes     History  Drug Use No    Social History   Social History  . Marital status: Single    Spouse name: N/A  . Number of children: N/A  . Years of education: N/A   Social History Main Topics  . Smoking status: Never Smoker  . Smokeless tobacco: Never Used  . Alcohol use Yes  . Drug use: No  .  Sexual activity: Not Asked   Other Topics Concern  . None   Social History Narrative  . None   Additional Social History:    Pain Medications: none History of alcohol / drug use?: Yes Negative Consequences of Use: Financial Withdrawal Symptoms: Agitation Name of Substance 1: alcohol 1 - Age of First Use: 47 1 - Amount (size/oz): 1.5 oz 1 - Frequency: 3-4 per week 1 - Duration: 3 years 1 - Last Use / Amount: few days ago                  Sleep: Poor  Appetite:  Fair  Current Medications: Current Facility-Administered Medications  Medication Dose Route Frequency Provider  Last Rate Last Dose  . acetaminophen (TYLENOL) tablet 650 mg  650 mg Oral Q6H PRN Audery Amel, MD      . alum & mag hydroxide-simeth (MAALOX/MYLANTA) 200-200-20 MG/5ML suspension 30 mL  30 mL Oral Q4H PRN Audery Amel, MD      . FLUoxetine (PROZAC) capsule 40 mg  40 mg Oral Daily Fatma Rutten B Kierre Deines, MD   40 mg at 10/24/15 0931  . hydrOXYzine (ATARAX/VISTARIL) tablet 25 mg  25 mg Oral TID PRN Shari Prows, MD   25 mg at 10/23/15 2234  . insulin aspart (novoLOG) injection 0-15 Units  0-15 Units Subcutaneous TID WC Jimmy Footman, MD   5 Units at 10/24/15 0808  . insulin aspart (novoLOG) injection 0-5 Units  0-5 Units Subcutaneous QHS Melaysia Streed B Remie Mathison, MD      . insulin aspart (novoLOG) injection 3 Units  3 Units Subcutaneous TID WC Shari Prows, MD   3 Units at 10/24/15 0807  . insulin glargine (LANTUS) injection 20 Units  20 Units Subcutaneous Daily Audery Amel, MD   20 Units at 10/24/15 [phone]  . LORazepam (ATIVAN) tablet 1 mg  1 mg Oral QHS Kaden Dunkel B Levia Waltermire, MD   1 mg at 10/23/15 2234  . magnesium hydroxide (MILK OF MAGNESIA) suspension 30 mL  30 mL Oral Daily PRN Audery Amel, MD        Lab Results:  Results for orders placed or performed during the hospital encounter of 10/22/15 (from the past 48 hour(s))  Glucose, capillary     Status: Abnormal   Collection Time: 10/22/15  9:38 PM  Result Value Ref Range   Glucose-Capillary 244 (H) 65 - 99 mg/dL  Glucose, capillary     Status: Abnormal   Collection Time: 10/23/15  6:37 AM  Result Value Ref Range   Glucose-Capillary 186 (H) 65 - 99 mg/dL  Glucose, capillary     Status: Abnormal   Collection Time: 10/23/15 11:51 AM  Result Value Ref Range   Glucose-Capillary 258 (H) 65 - 99 mg/dL   Comment 1 Notify RN   Glucose, capillary     Status: Abnormal   Collection Time: 10/23/15  4:44 PM  Result Value Ref Range   Glucose-Capillary 226 (H) 65 - 99 mg/dL  Glucose, capillary     Status: Abnormal    Collection Time: 10/23/15  8:37 PM  Result Value Ref Range   Glucose-Capillary 176 (H) 65 - 99 mg/dL  Glucose, capillary     Status: Abnormal   Collection Time: 10/24/15  6:40 AM  Result Value Ref Range   Glucose-Capillary 240 (H) 65 - 99 mg/dL   Comment 1 Notify RN    Comment 2 Document in Chart   Glucose, capillary     Status: Abnormal  Collection Time: 10/24/15  8:04 AM  Result Value Ref Range   Glucose-Capillary 248 (H) 65 - 99 mg/dL    Blood Alcohol level:  Lab Results  Component Value Date   ETH <5 10/21/2015    Metabolic Disorder Labs: Lab Results  Component Value Date   HGBA1C 9.0 (H) 10/21/2015   No results found for: PROLACTIN No results found for: CHOL, TRIG, HDL, CHOLHDL, VLDL, LDLCALC  Physical Findings: AIMS: Facial and Oral Movements Muscles of Facial Expression: None, normal Lips and Perioral Area: None, normal Jaw: None, normal Tongue: None, normal,Extremity Movements Upper (arms, wrists, hands, fingers): None, normal Lower (legs, knees, ankles, toes): None, normal, Trunk Movements Neck, shoulders, hips: None, normal, Overall Severity Severity of abnormal movements (highest score from questions above): None, normal Incapacitation due to abnormal movements: None, normal Patient's awareness of abnormal movements (rate only patient's report): No Awareness, Dental Status Current problems with teeth and/or dentures?: No Does patient usually wear dentures?: No  CIWA:  CIWA-Ar Total: 1 COWS:  COWS Total Score: 1  Musculoskeletal: Strength & Muscle Tone: within normal limits Gait & Station: normal Patient leans: N/A  Psychiatric Specialty Exam: Physical Exam  ROS  Blood pressure 133/88, pulse (!) 103, temperature 98.4 F (36.9 C), temperature source Oral, resp. rate 20, height 5\' 5"  (1.651 m), weight 85.7 kg (189 lb), SpO2 99 %.Body mass index is 31.45 kg/m.  General Appearance: Casual  Eye Contact:  Good  Speech:  Pressured  Volume:  Normal   Mood:  Dysphoric and Irritable  Affect:  Congruent  Thought Process:  Disorganized  Orientation:  Full (Time, Place, and Person)  Thought Content:  Illogical, Delusions and Paranoid Ideation  Suicidal Thoughts:  Yes.  with intent/plan  Homicidal Thoughts:  No  Memory:  Immediate;   Fair Recent;   Fair Remote;   Fair  Judgement:  Poor  Insight:  Lacking  Psychomotor Activity:  Normal  Concentration:  Concentration: Fair and Attention Span: Fair  Recall:  [employer] of Knowledge:  Fair  Language:  Fair  Akathisia:  No  Handed:  Right  AIMS (if indicated):     Assets:  Communication Skills Desire for Improvement Financial Resources/Insurance Housing Physical Health Resilience Social Support Transportation  ADL's:  Intact  Cognition:  WNL  Sleep:  Number of Hours: 6     Treatment Plan Summary: Daily contact with patient to assess and evaluate symptoms and progress in treatment and Medication management   Ms. Free is a 50 year old female with a history of PTSD admitted after suicide attempt by insulin overdose in the context of severe social stressors.  1. Suicidal ideation. The patient adamantly denies any thoughts, intentions or plans to hurt herself or others but attempted to hurt herself on the unit yesterday. She is unable to contract for safety in the hospital.  2. PTSD. The patient has been treated at the [place]. She does not know the name of her medications. She admits that she has not been compliant for a few days. She does not wish to start medication in the hospital.  3. Mood instability and thought disorganization. I would like to start Risperdal and Trileptal. The patient is uncertain if she wants to try new medication.  4. Hypertension. The patient reports history of hypertension but does not remember her medication. Vital signs are stable.  5. Dyslipidemia. She reportedly is on cholesterol lowering contact but does not know the name.  6. Insomnia.  Trazodone is available.  7. Legal.  The patient has legal charges and according to the chart there is an arrest warrant out.  8. Social. The patient is reportedly service-connected above discovered that she currently is not covert by [employer].  9. Diabetes. The patient reportedly had been prescribed metformin, Lantus and NovoLog. She has not been compliant with treatment and uses only a sliding scale insulin. When her sugars run in the 400s, she tells [place] that she takes 50 units of NovoLog. Per initial report the patient took 200 units prior to admission. It is unclear if she took long or short lasting insulin. There is conflicting information in the chart. We increased Lantus to 26 units daily. We continue ADA diet and blood glucose monitoring and sliding scale insulin. Diabetes nurse contribution is greatly appreciated. We added sliding scale at night and 3 units of NovoLog with meals. We'll continue to monitor.Disposition. She will be discharged to home. She will follow up with the [place].  10. Disposition. She will be discharged to home. She will follow up with the [place].  Destiny Linea, MD 10/24/2015, 10:13 AM

## 2015-10-24 NOTE — Progress Notes (Signed)
Inpatient Diabetes Program Recommendations  AACE/ADA: New Consensus Statement on Inpatient Glycemic Control (2015)  Target Ranges:  Prepandial:   less than 140 mg/dL      Peak postprandial:   less than 180 mg/dL (1-2 hours)      Critically ill patients:  140 - 180 mg/dL   Lab Results  Component Value Date   GLUCAP 251 (H) 10/24/2015   HGBA1C 9.0 (H) 10/21/2015    Review of Glycemic Control  Results for Destiny Palmer, Destiny Palmer (MRN [id number]) as of 10/24/2015 12:15  Ref. Range 10/23/2015 16:44 10/23/2015 20:37 10/24/2015 06:40 10/24/2015 08:04 10/24/2015 11:20  Glucose-Capillary Latest Ref Range: 65 - 99 mg/dL 710 (H) 626 (H) 948 (H) 248 (H) 251 (H)    Diabetes history: Type 2 diabetes Outpatient Diabetes medications: Metformin, Lantus and Novolog Current orders for Inpatient glycemic control: Lantus 20 units qhs, Novolog moderate correction scale tid, Novolog 0-5 units qhs, Novolog 3 units tid with meals  Inpatient Diabetes Program Recommendations:     Consider increasing Lantus to 26 units qday (0.3units/kg)  Susette Racer, RN, BA, [place], CDE Diabetes Coordinator Inpatient Diabetes Program  [phone] (Team Pager) [phone] [facility] Office) 10/24/2015 12:19 PM

## 2015-10-24 NOTE — [facility] Suicide Risk Assessment (Deleted)
[facility] Discharge Suicide Risk Assessment   Principal Problem: PTSD (post-traumatic stress disorder) Discharge Diagnoses:  Patient Active Problem List   Diagnosis Date Noted  . PTSD (post-traumatic stress disorder) [F43.10] 10/22/2015    Priority: High  . Adjustment disorder with mixed disturbance of emotions and conduct [F43.25] 10/24/2015  . Suicide attempt (HCC) [T14.91] 10/22/2015  . Diabetes mellitus without complication (HCC) [E11.9] 10/22/2015    Total Time spent with patient: 30 minutes  Musculoskeletal: Strength & Muscle Tone: within normal limits Gait & Station: normal Patient leans: N/A  Psychiatric Specialty Exam: ROS  Blood pressure 133/88, pulse (!) 103, temperature 98.4 F (36.9 C), temperature source Oral, resp. rate 20, height 5\' 5"  (1.651 m), weight 85.7 kg (189 lb), SpO2 99 %.Body mass index is 31.45 kg/m.  General Appearance: Casual  Eye Contact::  Good  Speech:  Pressured409  Volume:  Normal  Mood:  Dysphoric and Irritable  Affect:  Congruent  Thought Process:  Disorganized  Orientation:  Other:  to person and place.  Thought Content:  Illogical, Delusions and Paranoid Ideation  Suicidal Thoughts:  Yes.  with intent/plan  Homicidal Thoughts:  No  Memory:  Immediate;   Fair Recent;   Fair Remote;   Fair  Judgement:  Fair  Insight:  Fair  Psychomotor Activity:  Normal  Concentration:  Fair  Recall:  [employer] of Knowledge:Fair  Language: Fair  Akathisia:  No  Handed:  Right  AIMS (if indicated):     Assets:  Desire for Improvement Financial Resources/Insurance Housing Resilience Social Support  Sleep:  Number of Hours: 6  Cognition: WNL  ADL's:  Intact   Mental Status Per Nursing Assessment::   On Admission:     Demographic Factors:  Caucasian  Loss Factors: Legal issues  Historical Factors: Prior suicide attempts, Family history of mental illness or substance abuse and Impulsivity  Risk Reduction Factors:   Sense of  responsibility to family, Living with another person, especially a relative, Positive social support and Positive therapeutic relationship  Continued Clinical Symptoms:  Depression:   Impulsivity  Cognitive Features That Contribute To Risk:  None    Suicide Risk:  Moderate:  Frequent suicidal ideation with limited intensity, and duration, some specificity in terms of plans, no associated intent, good self-control, limited dysphoria/symptomatology, some risk factors present, and identifiable protective factors, including available and accessible social support.    Plan Of Care/Follow-up recommendations:  Activity:  as tolerated. Diet:  low sodium heart healthy. Other:  keep follow up appointments.  Kristine Linea, MD 10/24/2015, 11:54 AM

## 2015-10-24 NOTE — [facility] Group Notes (Signed)
[facility] Group Notes:  (Nursing/MHT/Case Management/Adjunct)  Date:  10/24/2015  Time:  4:04 PM  Type of Therapy:  Psychoeducational Skills  Participation Level:  None  Participation Quality:  Attentive  Affect:  Flat  Cognitive:  Appropriate  Insight:  Improving  Engagement in Group:  None  Modes of Intervention:  Discussion and Education  Summary of Progress/Problems:  Twanna Hy 10/24/2015, 4:04 PM

## 2015-10-24 NOTE — Plan of Care (Signed)
Problem: Coping: Goal: Ability to identify and develop effective coping behavior will improve Outcome: Progressing Pt remains disorganized, paranoid, fixated on sleeping medications and being drowsy. Will only engage in conversation regarding her sleeping issue. Will continue to encourage more effective coping for improvement.

## 2015-10-25 LAB — GLUCOSE, CAPILLARY
GLUCOSE-CAPILLARY: 274 mg/dL — AB (ref 65–99)
GLUCOSE-CAPILLARY: 277 mg/dL — AB (ref 65–99)
GLUCOSE-CAPILLARY: 206 mg/dL — AB (ref 65–99)
Glucose-Capillary: 278 mg/dL — ABNORMAL HIGH (ref 65–99)
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
## 2015-10-25 NOTE — Progress Notes (Signed)
Denies SI/HI/AVH. Affect depressed and anxious. Worried about not being able to sleep. PRN vistaril given for anxiety, effective. Visible in milieu. Minimal interaction, appropriate during interaction. Slept 8.15 hrs. Voices no additional concerns at this time. Safety maintained.

## 2015-10-25 NOTE — Progress Notes (Signed)
D:  When asked about depression patient states "it's alright"  Affect is blunted.  More visible in milieu and interaction with peers.  Forwards little. A:  Support and encouragement offered.  Scheduled medications given.  q 15 minute rounding done.  R:  Medication and group compliant.  Safety maintained.

## 2015-10-25 NOTE — Progress Notes (Signed)
Pt denies SI/HI/AVH. Affect blunted. In Med room Pt showed writer her left wrist which had scar on her left wrist. She stated "they missed this in the ER" .  Did not attend evening wrap up group outside with peers. Stated that she informed MDs that she is supposed to be wearing a CPAP at night.  Voices no additional concerns at this time. Safety maintained. Will continue to monitor.

## 2015-10-25 NOTE — [facility] Group Notes (Signed)
[facility] LCSW Group Therapy  10/25/2015 3:38 PM  Type of Therapy:  Group Therapy  Participation Level:  Minimal  Participation Quality:  Attentive  Affect:  Flat  Cognitive:  Lacking  Insight:  Lacking  Engagement in Therapy:  Limited  Modes of Intervention:  Activity, Discussion, Education and Support  Summary of Progress/Problems: Self care: Patients discussed self care and how it impacts them. Patients were asked to define self care in their own words. They discussed what aspects in their lives has influenced their self care. Patients also discussed self care in the areas of self regulation/control, hygiene/appearance, sleep/relaxation, healthy leisure, healthy eating habits, exercise, inner peace/spirituality, self improvement, sobriety, and health management. They were challenged to identify changes that are needed in order to improve self care. Pt did not participate in the group and was unable to identify areas she could improve her self-care.   Destiny Palmer G. Garnette Czech MSW, LCSWA 10/25/2015, 3:40 PM

## 2015-10-25 NOTE — [facility] Group Notes (Signed)
[facility] LCSW Group Therapy   10/25/2015  9:30am  Type of Therapy: Group Therapy   Participation Level: Did Not Attend. Patient invited to participate but declined.    Dorothe Pea. Kharee Lesesne, MSW, LCSWA, LCAS

## 2015-10-26 LAB — GLUCOSE, CAPILLARY
GLUCOSE-CAPILLARY: 325 mg/dL — AB (ref 65–99)
GLUCOSE-CAPILLARY: 329 mg/dL — AB (ref 65–99)
GLUCOSE-CAPILLARY: 210 mg/dL — AB (ref 65–99)
Glucose-Capillary: 241 mg/dL — ABNORMAL HIGH (ref 65–99)

## 2015-10-26 LAB — LIPID PANEL
Cholesterol: 224 mg/dL — ABNORMAL HIGH (ref 0–200)
HDL: 38 mg/dL — ABNORMAL LOW (ref >40)
LDL CALC: 138 mg/dL — AB (ref 0–99)
TRIGLYCERIDES: 242 mg/dL — AB (ref <150)
Total CHOL/HDL Ratio: 5.9 RATIO
VLDL: 48 mg/dL — AB (ref 0–40)

## 2015-10-26 NOTE — Progress Notes (Signed)
Paged diabetes coordinator at [phone], Erskine SquibbJane called back. Someone should be here this afternoon or in the morning.

## 2015-10-26 NOTE — [facility] Group Notes (Signed)
[facility] LCSW Group Therapy  10/26/2015 2:27 PM  Type of Therapy:  Group Therapy  Participation Level:  Minimal  Participation Quality:  Resistant  Affect:  Irritable  Cognitive:  Alert  Insight:  Poor  Engagement in Therapy:  Limited  Modes of Intervention:  Activity, Discussion, Education and Support  Summary of Progress/Problems: Emotional Regulation: Patients will identify both negative and positive emotions. They will discuss emotions they have difficulty regulating and how they impact their lives. Patients will be asked to identify healthy coping skills to combat unhealthy reactions to negative emotions. The group focused on the anger and how to manage anger appropriately.  Pt attended group but was hesitate to answer when prompted by CSW. Pt looked at the floor for the majority of group and had her arms folded. Pt minimally engaged and had no insight to the group discussion.   Weston Fulco G. Garnette CzechSampson MSW, LCSWA 10/26/2015, 2:30 PM

## 2015-10-26 NOTE — [facility] Group Notes (Signed)
[facility] Group Notes:  (Nursing/MHT/Case Management/Adjunct)  Date:  10/26/2015  Time:  9:43 AM  Type of Therapy:  Goal setting   Participation Level:  Did Not Attend  Twanna Hymanda C Keyle Doby 10/26/2015, 9:43 AM

## 2015-10-26 NOTE — Plan of Care (Signed)
Problem: Physical Regulation: Goal: Ability to maintain clinical measurements within normal limits will improve Outcome: Not Progressing CBG readings remain high

## 2015-10-26 NOTE — [facility] Group Notes (Signed)
[facility] Group Notes:  (Nursing/MHT/Case Management/Adjunct)  Date:  10/26/2015  Time:  11:10 PM  Type of Therapy:  Group Therapy  Participation Level:  Active  Participation Quality:  Appropriate  Affect:  Appropriate  Cognitive:  Appropriate  Insight:  Good and Improving  Engagement in Group:  Developing/Improving   Veva Holesshley Imani Margean Korell 10/26/2015, 11:10 PM

## 2015-10-26 NOTE — Progress Notes (Signed)
D: Patient with flat affect. Denies SI, HI, AVH. Continues to request cellphone to retrieve phone numbers. Pt has new orders for cpap at hs. Called respiratory therapy for machine. Per respiratory therapy, call if patient niece does not bring pts personal in.  A: Encouragement and support offered. Safety checks maintained. Encouraged group participation. R: Pt receptive and remains safe on unit. Meds given as prescribed.

## 2015-10-26 NOTE — Progress Notes (Signed)
[facility] MD Progress Note  10/26/2015 8:49 AM HARSHINI TRENT  MRN:  [id number]  Subjective:    The patient was able to sleep much better last night, over 7 hours. She is requesting a CPAP machine in order was placed today. She says she will try to have her niece bring the machine but does not know whether not she can. She does still have flashbacks related to experiences incarcerated. No nightmares last night however. She continues to be very preoccupied with her court case and is anxious about her up coming court date on the 16th. Affect is blunted. She remains very negative and pessimistic about the court situation. She denies any current active or passive suicidal thoughts and says her mood is actually improved some since yesterday but anxiety level remains high. She does endorse some anhedonia. She did attend 1 group yesterday but otherwise is fairly as isolative to her room. She did not attend the wrap up group. The patient denies any problems with her appetite. Vital signs are stable. No new somatic complaints.  [facility] do not have a bed this minute but are interested in working with her.   Past psychiatric history. She denies ever being hospitalized. She sees a Therapist, sports or therapist at the [place]. She has been prescribed medications for PTSD but does not recall the name. She reportedly attempted suicide while in jail.  Family psychiatric history. Multiple family members with substance abuse. Her brother tried to shoot himself.  Social history. 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 We discovered however that her [employer] had lapsed. She lives in a house with a niece. She missed her mortgage payment on the first of this month and is extremely worried that she will lose her housing. There are multiple legal problems that the patient would not elaborate on. We note that there is the "first charge" but there are more charges  pending. The patient does have a lawyer who did not show up for court. She works with the [employer] related organization helping her to avoid further jail time. Apparently, there is arrest warrant out.Past psychiatric history. She denies ever being hospitalized. She sees a Therapist, sports or therapist at the [place]. She has been prescribed medications for PTSD but does not recall the name. She reportedly attempted suicide while in jail.    Principal Problem: PTSD (post-traumatic stress disorder) Diagnosis:   Patient Active Problem List   Diagnosis Date Noted  . Adjustment disorder with mixed disturbance of emotions and conduct [F43.25] 10/24/2015  . Suicide attempt (HCC) [T14.91] 10/22/2015  . PTSD (post-traumatic stress disorder) [F43.10] 10/22/2015  . Diabetes mellitus without complication (HCC) [E11.9] 10/22/2015   Total Time spent with patient: 20 minutes  Past Psychiatric History:  PTSD. Past Medical History:  Past Medical History:  Diagnosis Date  . Diabetes mellitus without complication (HCC)   . Hypertension     Past Surgical History:  Procedure Laterality Date  . NO PAST SURGERIES     Family History:  Family History  Problem Relation Age of Onset  . COPD Mother    Family Psychiatric  History: See H&P.  Social History:  History  Alcohol Use  . Yes     History  Drug Use No    Social History   Social History  . Marital status: Single    Spouse name: N/A  . Number of children: N/A  . Years of education: N/A  Social History Main Topics  . Smoking status: Never Smoker  . Smokeless tobacco: Never Used  . Alcohol use Yes  . Drug use: No  . Sexual activity: Not Asked   Other Topics Concern  . None   Social History Narrative  . None   Additional Social History:    Pain Medications: none History of alcohol / drug use?: Yes Negative Consequences of Use: Financial Withdrawal Symptoms: Agitation Name of Substance 1: alcohol 1 - Age of First Use: 47 1 - Amount  (size/oz): 1.5 oz 1 - Frequency: 3-4 per week 1 - Duration: 3 years 1 - Last Use / Amount: few days ago                 Sleep: Fair  Appetite:  Good  Current Medications: Current Facility-Administered Medications  Medication Dose Route Frequency Provider Last Rate Last Dose  . acetaminophen (TYLENOL) tablet 650 mg  650 mg Oral Q6H PRN Audery Amel, MD      . alum & mag hydroxide-simeth (MAALOX/MYLANTA) 200-200-20 MG/5ML suspension 30 mL  30 mL Oral Q4H PRN Audery Amel, MD      . FLUoxetine (PROZAC) capsule 40 mg  40 mg Oral Daily Jolanta B Pucilowska, MD   40 mg at 10/26/15 0848  . hydrOXYzine (ATARAX/VISTARIL) tablet 25 mg  25 mg Oral TID PRN Shari Prows, MD   25 mg at 10/24/15 2128  . insulin aspart (novoLOG) injection 0-15 Units  0-15 Units Subcutaneous TID WC Jimmy Footman, MD   11 Units at 10/26/15 0848  . insulin aspart (novoLOG) injection 0-5 Units  0-5 Units Subcutaneous QHS Shari Prows, MD   3 Units at 10/25/15 2122  . insulin aspart (novoLOG) injection 3 Units  3 Units Subcutaneous TID WC Shari Prows, MD   3 Units at 10/26/15 0848  . insulin glargine (LANTUS) injection 26 Units  26 Units Subcutaneous Daily Shari Prows, MD   26 Units at 10/26/15 0847  . LORazepam (ATIVAN) tablet 1 mg  1 mg Oral QHS Jolanta B Pucilowska, MD   1 mg at 10/25/15 2121  . magnesium hydroxide (MILK OF MAGNESIA) suspension 30 mL  30 mL Oral Daily PRN Audery Amel, MD      . OXcarbazepine (TRILEPTAL) tablet 150 mg  150 mg Oral BID Shari Prows, MD   150 mg at 10/26/15 0848  . risperiDONE (RISPERDAL) tablet 2 mg  2 mg Oral QHS Jolanta B Pucilowska, MD   2 mg at 10/25/15 2121    Lab Results:  Results for orders placed or performed during the hospital encounter of 10/22/15 (from the past 48 hour(s))  Glucose, capillary     Status: Abnormal   Collection Time: 10/24/15 11:20 AM  Result Value Ref Range   Glucose-Capillary 251 (H) 65 - 99  mg/dL  Glucose, capillary     Status: Abnormal   Collection Time: 10/24/15  4:24 PM  Result Value Ref Range   Glucose-Capillary 252 (H) 65 - 99 mg/dL  Glucose, capillary     Status: Abnormal   Collection Time: 10/24/15  8:21 PM  Result Value Ref Range   Glucose-Capillary 130 (H) 65 - 99 mg/dL   Comment 1 Notify RN   Glucose, capillary     Status: Abnormal   Collection Time: 10/25/15  6:41 AM  Result Value Ref Range   Glucose-Capillary 274 (H) 65 - 99 mg/dL  Glucose, capillary     Status: Abnormal  Collection Time: 10/25/15 11:41 AM  Result Value Ref Range   Glucose-Capillary 277 (H) 65 - 99 mg/dL  Glucose, capillary     Status: Abnormal   Collection Time: 10/25/15  5:05 PM  Result Value Ref Range   Glucose-Capillary 206 (H) 65 - 99 mg/dL   Comment 1 Notify RN    Comment 2 Document in Chart   Glucose, capillary     Status: Abnormal   Collection Time: 10/25/15  7:51 PM  Result Value Ref Range   Glucose-Capillary 278 (H) 65 - 99 mg/dL  Lipid panel     Status: Abnormal   Collection Time: 10/26/15  6:32 AM  Result Value Ref Range   Cholesterol 224 (H) 0 - 200 mg/dL   Triglycerides 409 (H) <150 mg/dL   HDL 38 (L) >81 mg/dL   Total CHOL/HDL Ratio 5.9 RATIO   VLDL 48 (H) 0 - 40 mg/dL   LDL Cholesterol 191 (H) 0 - 99 mg/dL    Comment:        Total Cholesterol/HDL:CHD Risk Coronary Heart Disease Risk Table                     Men   Women  1/2 Average Risk   3.4   3.3  Average Risk       5.0   4.4  2 X Average Risk   9.6   7.1  3 X Average Risk  23.4   11.0        Use the calculated Patient Ratio above and the CHD Risk Table to determine the patient's CHD Risk.        ATP III CLASSIFICATION (LDL):  <100     mg/dL   Optimal  478-295  mg/dL   Near or Above                    Optimal  130-159  mg/dL   Borderline  621-308  mg/dL   High  >657     mg/dL   Very High   Glucose, capillary     Status: Abnormal   Collection Time: 10/26/15  6:37 AM  Result Value Ref Range    Glucose-Capillary 325 (H) 65 - 99 mg/dL    Blood Alcohol level:  Lab Results  Component Value Date   ETH <5 10/21/2015    Metabolic Disorder Labs: Lab Results  Component Value Date   HGBA1C 9.0 (H) 10/21/2015   No results found for: PROLACTIN Lab Results  Component Value Date   CHOL 224 (H) 10/26/2015   TRIG 242 (H) 10/26/2015   HDL 38 (L) 10/26/2015   CHOLHDL 5.9 10/26/2015   VLDL 48 (H) 10/26/2015   LDLCALC 138 (H) 10/26/2015    Physical Findings: AIMS: Facial and Oral Movements Muscles of Facial Expression: None, normal Lips and Perioral Area: None, normal Jaw: None, normal Tongue: None, normal,Extremity Movements Upper (arms, wrists, hands, fingers): None, normal Lower (legs, knees, ankles, toes): None, normal, Trunk Movements Neck, shoulders, hips: None, normal, Overall Severity Severity of abnormal movements (highest score from questions above): None, normal Incapacitation due to abnormal movements: None, normal Patient's awareness of abnormal movements (rate only patient's report): No Awareness, Dental Status Current problems with teeth and/or dentures?: No Does patient usually wear dentures?: No  CIWA:  CIWA-Ar Total: 1 COWS:  COWS Total Score: 1  Musculoskeletal: Strength & Muscle Tone: within normal limits Gait & Station: normal Patient leans: N/A  Psychiatric Specialty Exam: Physical Exam  Review of Systems  Constitutional: Negative.  Negative for chills, fever and weight loss.  HENT: Negative.  Negative for hearing loss, sore throat and tinnitus.   Eyes: Negative.  Negative for blurred vision, double vision and photophobia.  Respiratory: Negative.  Negative for cough, shortness of breath and wheezing.   Cardiovascular: Negative.  Negative for chest pain and palpitations.  Gastrointestinal: Negative.  Negative for abdominal pain, constipation, diarrhea, heartburn, nausea and vomiting.  Genitourinary: Negative.  Negative for dysuria, frequency and  urgency.  Musculoskeletal: Negative.  Negative for back pain, joint pain, myalgias and neck pain.  Skin: Negative.  Negative for itching and rash.  Neurological: Negative.  Negative for dizziness, tingling, tremors, sensory change, seizures, loss of consciousness and headaches.  Endo/Heme/Allergies: Negative.  Negative for environmental allergies. Does not bruise/bleed easily.    Blood pressure (!) 146/76, pulse (!) 107, temperature 98 F (36.7 C), resp. rate 18, height 5\' 5"  (1.651 m), weight 85.7 kg (189 lb), SpO2 99 %.Body mass index is 31.45 kg/m.  General Appearance: Casual  Eye Contact:  Good  Speech:  Pressured  Volume:  Normal  Mood:  Dysphoric and Irritable  Affect:  Blunt  Thought Process:  Disorganized  Orientation:  Full (Time, Place, and Person)  Thought Content:  Illogical, Delusions and Paranoid Ideation  Suicidal Thoughts:  No  Homicidal Thoughts:  No  Memory:  Immediate;   Fair Recent;   Fair Remote;   Fair  Judgement:  Poor  Insight:  Lacking  Psychomotor Activity:  Normal  Concentration:  Concentration: Fair and Attention Span: Fair  Recall:  [employer] of Knowledge:  Fair  Language:  Fair  Akathisia:  No  Handed:  Right  AIMS (if indicated):     Assets:  Communication Skills Desire for Improvement Financial Resources/Insurance Housing Physical Health Resilience Social Support Transportation  ADL's:  Intact  Cognition:  WNL  Sleep:  Number of Hours: 7.3     Treatment Plan Summary: Daily contact with patient to assess and evaluate symptoms and progress in treatment and Medication management   Ms. Rorke is a 50 year old female with a history of PTSD admitted after suicide attempt by insulin overdose in the context of severe social stressors.  1. Suicidal ideation. The patient adamantly denies any thoughts, intentions or plans to hurt herself or others but attempted to hurt herself on the unit 2 days ago. She is now able to contract for safety in  the hospital.  2. PTSD. The patient has been treated at the [place]. She does not know the name of her medications. She was started on Risperdal which may help with PTSD symptoms.  3. Mood instability and thought disorganization. She remains preoccupied with legal charges. She was started Risperdal 2mg  po nightly and Trileptal 150mg  po BID. So far she is tolerating the medications well. HgA1c was 9.0. Total cholesterol was 224. Prolactin level pending.  4. Hypertension. The patient reports history of hypertension but does not remember her medication. Vital signs are stable.  5. Dyslipidemia. She reportedly is on cholesterol lowering contact but does not know the name. Will check lipid panel.  6. Legal. The patient has legal charges and according to the chart there is an arrest warrant out.  7. Social. The patient is reportedly service-connected above discovered that she currently is not covert by [employer].  8. Diabetes. BS remain elevated. The patient reportedly had been prescribed metformin, Lantus and NovoLog. She has not been compliant with treatment and uses only a  sliding scale insulin. When her sugars run in the 400s, she tells us that she takes 50 units of NovoLog. Per initial report the patient took 200 units prior to admission. It is unclear if she took long or short lasting insulin. There is conflicting information in the chart.  Increased. Lantus to 26 units daily. We continue ADA diet and blood glucose monitoring and sliding scale insulin. Diabetes nurse contribution is greatly appreciated. We added sliding scale at night and 3 units of NovoLog with meals. We'll continue to monitor.Disposition. She will be discharged to home. [facility] will [facility] [place].  9. Disposition. She will be discharged to home. She will [facility] up with [facility] [place].  Levora AngelKAPUR,Roiza Wiedel KAMAL, MD 10/26/2015, 8:49 AM

## 2015-10-27 LAB — GLUCOSE, CAPILLARY
Glucose-Capillary: 294 mg/dL — ABNORMAL HIGH (ref 65–99)
Glucose-Capillary: 374 mg/dL — ABNORMAL HIGH (ref 65–99)
Glucose-Capillary: 157 mg/dL — ABNORMAL HIGH (ref 65–99)

## 2015-10-27 LAB — PROLACTIN: PROLACTIN: 48.7 ng/mL — AB (ref 4.8–23.3)

## 2015-10-27 MED ORDER — HYDROXYZINE HCL 25 MG PO TABS: 25.0000 mg | ORAL_TABLET | Freq: Three times a day (TID) | ORAL | 0 refills | Status: DC | PRN

## 2015-10-27 MED ORDER — RISPERIDONE 2 MG PO TABS: 2.0000 mg | ORAL_TABLET | Freq: Every day | ORAL | 0 refills | Status: DC

## 2015-10-27 MED ORDER — OXCARBAZEPINE 150 MG PO TABS: 150.0000 mg | ORAL_TABLET | Freq: Two times a day (BID) | ORAL | 0 refills | Status: DC

## 2015-10-27 MED ORDER — INSULIN ASPART 100 UNIT/ML ~~LOC~~ SOLN: 0.0000 [IU] | Freq: Three times a day (TID) | SUBCUTANEOUS | 11 refills | Status: DC

## 2015-10-27 MED ORDER — FLUOXETINE HCL 40 MG PO CAPS: 40.0000 mg | ORAL_CAPSULE | Freq: Every day | ORAL | 0 refills | Status: AC

## 2015-10-27 MED ORDER — FLUOXETINE HCL 40 MG PO CAPS: 40.0000 mg | ORAL_CAPSULE | Freq: Every day | ORAL | 0 refills | Status: DC

## 2015-10-27 MED ORDER — INSULIN ASPART 100 UNIT/ML ~~LOC~~ SOLN: 3.0000 [IU] | Freq: Three times a day (TID) | SUBCUTANEOUS | 11 refills | Status: DC

## 2015-10-27 MED ORDER — INSULIN GLARGINE 100 UNIT/ML ~~LOC~~ SOLN: 26.0000 [IU] | Freq: Every day | SUBCUTANEOUS | 11 refills | Status: DC

## 2015-10-27 NOTE — Progress Notes (Signed)
Sleeping. Sitter present 

## 2015-10-27 NOTE — Progress Notes (Signed)
Pt denies any SI/HI/AVH. Attended evening wrap up group. RT set up CPAP for Pt this evening. 1:1 sitter placed with Pt for safety. Visible in milieu socializing minimally with peers. Appropriate during interaction. Medication compliant. Voices no additional concerns at this time. Safety maintained.

## 2015-10-27 NOTE — Progress Notes (Signed)
Sleeping. Sitter present.

## 2015-10-27 NOTE — Progress Notes (Signed)
Pt in bed sleeping wearing CPAP. Sitter at bedside. 

## 2015-10-27 NOTE — Plan of Care (Signed)
Problem: [facility] Participation in Recreation Therapeutic Interventions Goal: STG-Patient will demonstrate improved self esteem by identif STG: Self-Esteem - Within 4 treatment sessions, patient will verbalize at least 5 positive affirmation statements in each of 2 treatment sessions to increase self-esteem post d/c.  Outcome: Completed/Met Date Met: 10/27/15 Treatment Session 2; Completed 2 out of 2: At approximately 2:55 pm, LRT met with patient in patient room. Patient verbalized 5 positive affirmation statements. Patient reported it felt "okay". LRT encouraged patient to continue saying positive affirmation statements.  Leonette Monarch, LRT/CTRS 08.07.17 3:29 pm Goal: STG-Other Recreation Therapy Goal (Specify) STG: Stress Management - Within 4 treatment sessions, patient will verbalize understanding of the stress management techniques in each of 2 treatment sessions to increase stress management skills post d/c.  Outcome: Completed/Met Date Met: 10/27/15 Treatment Session 2; Completed 2 out of 2: At approximately 2:55 pm, LRT met with patient in patient room. Patient reported she read over and practiced some of the stress management techniques. Patient verbalized understanding and reported the techniques she practiced were helpful. LRT encouraged patient to continue reading and practicing the stress management techniques.  Leonette Monarch, LRT/CTRS 08.07.17 3:30 pm

## 2015-10-27 NOTE — [facility] Suicide Risk Assessment (Signed)
[facility] INPATIENT:  Family/Significant Other Suicide Prevention Education  Suicide Prevention Education:  Education Completed;  Lia Foyermber Clayton, Niece, [phone],  (name of family member/significant other) has been identified by the patient as the family member/significant other with whom the patient will be residing, and identified as the person(s) who will aid the patient in the event of a mental health crisis (suicidal ideations/suicide attempt).  With written consent from the patient, the family member/significant other has been provided the following suicide prevention education, prior to the and/or following the discharge of the patient.  The suicide prevention education provided includes the following:  Suicide risk factors  Suicide prevention and interventions  National Suicide Hotline telephone number  [facility] assessment telephone number  [facility] Emergency Assistance 911  [facility] and/or [facility]idential [facility] Crisis [facility] telephone number  Request made of family/significant other to:  Remove weapons (e.g., guns, rifles, knives), all items previously/currently identified as safety concern.    Remove drugs/medications (over-the-counter, prescriptions, illicit drugs), all items previously/currently identified as a safety concern.  The family member/significant other verbalizes understanding of the suicide prevention education information provided.  The family member/significant other agrees to remove the items of safety concern listed above.  Glennon MacLaws, Martez Weiand P, MSW, LCSW 10/27/2015, 4:47 PM

## 2015-10-27 NOTE — Tx Team (Signed)
Interdisciplinary Treatment Plan Update (Adult)  Date:  10/27/2015 Time Reviewed:  4:50 PM Pt discharging today Review of initial/current patient goals per problem list:   1.? Goal(s): Patient will participate in aftercare plan ? Met:?YES  ? Target date: at discharge  ? As evidenced by: Patient will participate within aftercare plan AEB aftercare provider and housing plan at discharge being identified.  ? 2.? Goal (s): Patient will exhibit decreased depressive symptoms and suicidal ideations. ? Met:?YES  ? ?Target date: at discharge  ? As evidenced by: Patient will utilize self rating of depression at 3 or below and demonstrate decreased signs of depression or be deemed stable for discharge by MD.   Scribe for Treatment Team:   August Saucer, 10/27/2015, 4:50 PM, MSW, LCSW

## 2015-10-27 NOTE — Plan of Care (Signed)
Problem: Self-Concept: Goal: Ability to identify factors that promote anxiety will improve Outcome: Progressing Pt states she is anxious regarding legal issues.

## 2015-10-27 NOTE — Progress Notes (Signed)
Inpatient Diabetes Program Recommendations  AACE/ADA: New Consensus Statement on Inpatient Glycemic Control (2015)  Target Ranges:  Prepandial:   less than 140 mg/dL      Peak postprandial:   less than 180 mg/dL (1-2 hours)      Critically ill patients:  140 - 180 mg/dL   Results for Destiny Palmer, Destiny Palmer (MRN [id number]) as of 10/27/2015 10:46  Ref. Range 10/26/2015 06:37 10/26/2015 11:40 10/26/2015 16:38 10/26/2015 20:07  Glucose-Capillary Latest Ref Range: 65 - 99 mg/dL 409325 (H) 811329 (H) 914241 (H) 210 (H)   Results for Destiny Palmer, Destiny Palmer (MRN [id number]) as of 10/27/2015 10:46  Ref. Range 10/27/2015 06:38  Glucose-Capillary Latest Ref Range: 65 - 99 mg/dL 086294 (H)    Home DM Meds: Lantus, Novolog, Metformin (unknown doses)  Current Insulin Orders: Lantus 26 units daily      Novolog Moderate Correction Scale/ SSI (0-15 units) TID AC + HS      Novolog 3 units tid with meals      MD- Please consider the following in-hospital insulin adjustments:  1. Increase Lantus to 35 units daily (0.4 units/kg dosing)  2. Increase Novolog Meal Coverage to 6 units tid with meals (hold if pt eats <50% of meal)      --Will follow patient during hospitalization--  Ambrose FinlandJeannine Johnston Bekah Igoe RN, MSN, CDE Diabetes Coordinator Inpatient Glycemic Control Team Team Pager: [phone] (8a-5p)

## 2015-10-27 NOTE — Progress Notes (Signed)
[facility]  10/27/2015 2:48 PM Mikeal HawthorneKristin M Carranco  MRN:  [id number]  Subjective:  Destiny Palmer is extremely anxious and worried about her legal situation. So far we have not been able get [facility] program that would allow her to avoid [facility]. She is somewhat less disorganized. She accepts medications and tolerates them well. She complains of sedation. There are no others somatic complaints. Good group participation. She started using CPAP machine and has sitter at night. She denies suicidal or homicidal ideation today.  Principal Problem: PTSD (post-traumatic stress disorder) Diagnosis:   Patient Active Problem List   Diagnosis Date Noted  . PTSD (post-traumatic stress disorder) [F43.10] 10/22/2015    Priority: High  . Adjustment disorder with mixed disturbance of emotions and conduct [F43.25] 10/24/2015  . Suicide attempt (HCC) [T14.91] 10/22/2015  . Diabetes mellitus without complication (HCC) [E11.9] 10/22/2015   Total Time spent with patient: 20 minutes  Past Psychiatric History: PTSD.  Past Medical History:  Past Medical History:  Diagnosis Date  . Diabetes mellitus without complication (HCC)   . Hypertension     Past Surgical History:  Procedure Laterality Date  . NO PAST SURGERIES     Family History:  Family History  Problem Relation Age of Onset  . COPD Mother    Family Psychiatric  History: See H&P. Social History:  History  Alcohol Use  . Yes     History  Drug Use No    Social History   Social History  . Marital status: Single    Spouse name: N/A  . Number of children: N/A  . Years of education: N/A   Social History Main Topics  . Smoking status: Never Smoker  . Smokeless tobacco: Never Used  . Alcohol use Yes  . Drug use: No  . Sexual activity: Not Asked   Other Topics Concern  . None   Social History Narrative  . None   Additional Social History:    Pain Medications: none History of alcohol / drug use?: Yes Negative Consequences  of Use: Financial Withdrawal Symptoms: Agitation Name of Substance 1: alcohol 1 - Age of First Use: 47 1 - Amount (size/oz): 1.5 oz 1 - Frequency: 3-4 per week 1 - Duration: 3 years 1 - Last Use / Amount: few days ago                  Sleep: Fair  Appetite:  Fair  Current Medications: Current Facility-Administered Medications  Medication Dose Route Frequency Provider Last Rate Last Dose  . acetaminophen (TYLENOL) tablet 650 mg  650 mg Oral Q6H PRN Audery AmelJohn T Clapacs, MD      . alum & mag hydroxide-simeth (MAALOX/MYLANTA) 200-200-20 MG/5ML suspension 30 mL  30 mL Oral Q4H PRN Audery AmelJohn T Clapacs, MD      . FLUoxetine (PROZAC) capsule 40 mg  40 mg Oral Daily Lacrystal Barbe B Shavar Gorka, MD   40 mg at 10/27/15 0804  . hydrOXYzine (ATARAX/VISTARIL) tablet 25 mg  25 mg Oral TID PRN Shari ProwsJolanta B Elenora Hawbaker, MD   25 mg at 10/24/15 2128  . insulin aspart (novoLOG) injection 0-15 Units  0-15 Units Subcutaneous TID WC Jimmy FootmanAndrea Hernandez-Gonzalez, MD   15 Units at 10/27/15 1110  . insulin aspart (novoLOG) injection 0-5 Units  0-5 Units Subcutaneous QHS Shari ProwsJolanta B Yahel Fuston, MD   2 Units at 10/26/15 2129  . insulin aspart (novoLOG) injection 3 Units  3 Units Subcutaneous TID WC Bryson Gavia B Shaena Parkerson, MD   3 Units  at 10/27/15 1111  . insulin glargine (LANTUS) injection 26 Units  26 Units Subcutaneous Daily Shari Prows, MD   26 Units at 10/27/15 0805  . LORazepam (ATIVAN) tablet 1 mg  1 mg Oral QHS Shari Prows, MD   1 mg at 10/26/15 2129  . magnesium hydroxide (MILK OF MAGNESIA) suspension 30 mL  30 mL Oral Daily PRN Audery Amel, MD      . OXcarbazepine (TRILEPTAL) tablet 150 mg  150 mg Oral BID Shari Prows, MD   150 mg at 10/27/15 0804  . risperiDONE (RISPERDAL) tablet 2 mg  2 mg Oral QHS Shari Prows, MD   2 mg at 10/26/15 2129    Lab Results:  Results for orders placed or performed during the hospital encounter of 10/22/15 (from the past 48 hour(s))  Glucose, capillary      Status: Abnormal   Collection Time: 10/25/15  5:05 PM  Result Value Ref Range   Glucose-Capillary 206 (H) 65 - 99 mg/dL   Comment 1 Notify RN    Comment 2 Document in Chart   Glucose, capillary     Status: Abnormal   Collection Time: 10/25/15  7:51 PM  Result Value Ref Range   Glucose-Capillary 278 (H) 65 - 99 mg/dL  Lipid panel     Status: Abnormal   Collection Time: 10/26/15  6:32 AM  Result Value Ref Range   Cholesterol 224 (H) 0 - 200 mg/dL   Triglycerides 638 (H) <150 mg/dL   HDL 38 (L) >75 mg/dL   Total CHOL/HDL Ratio 5.9 RATIO   VLDL 48 (H) 0 - 40 mg/dL   LDL Cholesterol 643 (H) 0 - 99 mg/dL    Comment:        Total Cholesterol/HDL:CHD Risk Coronary Heart Disease Risk Table                     Men   Women  1/2 Average Risk   3.4   3.3  Average Risk       5.0   4.4  2 X Average Risk   9.6   7.1  3 X Average Risk  23.4   11.0        Use the calculated Patient Ratio above and the CHD Risk Table to determine the patient's CHD Risk.        ATP III CLASSIFICATION (LDL):  <100     mg/dL   Optimal  329-518  mg/dL   Near or Above                    Optimal  130-159  mg/dL   Borderline  841-660  mg/dL   High  >630     mg/dL   Very High   Prolactin     Status: Abnormal   Collection Time: 10/26/15  6:32 AM  Result Value Ref Range   Prolactin 48.7 (H) 4.8 - 23.3 ng/mL    Comment: (NOTE) Performed At: [facility] [address] [place], [place] [id number] Mila Homer MD FT:[id number]   Glucose, capillary     Status: Abnormal   Collection Time: 10/26/15  6:37 AM  Result Value Ref Range   Glucose-Capillary 325 (H) 65 - 99 mg/dL  Glucose, capillary     Status: Abnormal   Collection Time: 10/26/15 11:40 AM  Result Value Ref Range   Glucose-Capillary 329 (H) 65 - 99 mg/dL   Comment 1 Notify RN  Glucose, capillary     Status: Abnormal   Collection Time: 10/26/15  4:38 PM  Result Value Ref Range   Glucose-Capillary 241 (H) 65 - 99 mg/dL   Comment 1  Notify RN   Glucose, capillary     Status: Abnormal   Collection Time: 10/26/15  8:07 PM  Result Value Ref Range   Glucose-Capillary 210 (H) 65 - 99 mg/dL  Glucose, capillary     Status: Abnormal   Collection Time: 10/27/15  6:38 AM  Result Value Ref Range   Glucose-Capillary 294 (H) 65 - 99 mg/dL  Glucose, capillary     Status: Abnormal   Collection Time: 10/27/15 11:07 AM  Result Value Ref Range   Glucose-Capillary 374 (H) 65 - 99 mg/dL   Comment 1 Notify RN     Blood Alcohol level:  Lab Results  Component Value Date   ETH <5 10/21/2015    Metabolic Disorder Labs: Lab Results  Component Value Date   HGBA1C 9.0 (H) 10/21/2015   Lab Results  Component Value Date   PROLACTIN 48.7 (H) 10/26/2015   Lab Results  Component Value Date   CHOL 224 (H) 10/26/2015   TRIG 242 (H) 10/26/2015   HDL 38 (L) 10/26/2015   CHOLHDL 5.9 10/26/2015   VLDL 48 (H) 10/26/2015   LDLCALC 138 (H) 10/26/2015    Physical Findings: AIMS: Facial and Oral Movements Muscles of Facial Expression: None, normal Lips and Perioral Area: None, normal Jaw: None, normal Tongue: None, normal,Extremity Movements Upper (arms, wrists, hands, fingers): None, normal Lower (legs, knees, ankles, toes): None, normal, Trunk Movements Neck, shoulders, hips: None, normal, Overall Severity Severity of abnormal movements (highest score from questions above): None, normal Incapacitation due to abnormal movements: None, normal Patient's awareness of abnormal movements (rate only patient's report): No Awareness, Dental Status Current problems with teeth and/or dentures?: No Does patient usually wear dentures?: No  CIWA:  CIWA-Ar Total: 1 COWS:  COWS Total Score: 1  Musculoskeletal: Strength & Muscle Tone: within normal limits Gait & Station: normal Patient leans: N/A  Psychiatric Specialty Exam: Physical Exam  Nursing note and vitals reviewed.   Review of Systems  Psychiatric/Behavioral: The patient is  nervous/anxious.   All other systems reviewed and are negative.   Blood pressure (!) 141/90, pulse (!) 117, temperature 98 F (36.7 C), temperature source Oral, resp. rate 18, height 5\' 5"  (1.651 m), weight 85.7 kg (189 lb), SpO2 99 %.Body mass index is 31.45 kg/m.  General Appearance: Casual  Eye Contact:  Good  Speech:  Clear and Coherent  Volume:  Normal  Mood:  Anxious  Affect:  Appropriate  Thought Process:  Disorganized  Orientation:  Full (Time, Place, and Person)  Thought Content:  Delusions and Paranoid Ideation  Suicidal Thoughts:  No  Homicidal Thoughts:  No  Memory:  Immediate;   Fair Recent;   Fair Remote;   Fair  Judgement:  Poor  Insight:  Lacking  Psychomotor Activity:  Normal  Concentration:  Concentration: Fair and Attention Span: Fair  Recall:  [employer] of Knowledge:  Fair  Language:  Fair  Akathisia:  No  Handed:  Right  AIMS (if indicated):     Assets:  Communication Skills Desire for Improvement Financial Resources/Insurance Housing Physical Health Resilience Social Support  ADL's:  Intact  Cognition:  WNL  Sleep:  Number of Hours: 7.45     Treatment Plan Summary: Daily contact with patient to assess and evaluate symptoms and progress in treatment  and Medication management   Ms. Bowersox is a 50 year old female with a history of PTSD admitted after suicide attempt by insulin overdose in the context of severe social stressors.  1. Suicidal ideation. The patient adamantly denies any thoughts, intentions or plans to hurt herself or others but attempted to hurt herself on the unit. She is now able to contract for safety in the hospital.  2. PTSD. The patient Initially denied any nightmares or flashbacks but now complains of flashbacks of harassment in jail in December 2016. She was started on Risperdal which may help with PTSD symptoms.  3. Mood instability and thought disorganization. She remains preoccupied with legal charges. She was started  on Prozac, Risperdal, ns Trileptal.    4. Hypertension. The patient reports history of hypertension but does not remember her medication. Vital signs are stable.  5. Metabolic syndrome monitoring. HgA1c was 9.0. Total cholesterol was 224. Prolactin 48.7.   6. Legal. The patient has legal charges and according to the chart there is an arrest warrant out.  7. Social. The patient is reportedly service-connected above discovered that she currently is not covert by [employer].  8. Diabetes. BS remain elevated. The patient is on Lantus and NovoLog. We increased. Lantus to 26 units daily,  added sliding scale at night and 3 units of NovoLog with meals. We continue ADA diet and blood glucose monitoring and sliding scale insulin. Diabetes nurse contribution is greatly appreciated.   9. Disposition. She will be discharged to home. She will follow up with the [place].  Kristine Linea, MD 10/27/2015, 2:48 PM

## 2015-10-27 NOTE — Progress Notes (Signed)
Recreation Therapy Notes  Date: 08.07.17 Time: 1:00 pm Location: Craft Room  Group Topic: Self-expression  Goal Area(s) Addresses:  Patient will be able to identify a color that represents each emotion. Patient will verbalize benefit of using art as a means of self-expression. Patient will verbalize one emotion experienced while participating in activity.  Behavioral Response: Attentive, Interactive  Intervention: The Colors Within Me  Activity: Patients were given blank face worksheets and instructed to pick a color for each emotion they were experiencing and show on the face how much of that emotion they were experiencing.  Education: LRT educated patients on other forms of self-expression.  Education Outcome: Acknowledges education/In group clarification offered  Clinical Observations/Feedback: Patient completed activity by drawing a face. Patient contributed to group discussion by stating what emotions she was feeling.  Jacquelynn CreeGreene,Drako Maese M, LRT/CTRS 10/27/2015 3:27 PM

## 2015-10-27 NOTE — [facility] Suicide Risk Assessment (Signed)
[facility]   Principal Problem: PTSD (post-traumatic stress disorder) Discharge Diagnoses:  Patient Active Problem List   Diagnosis Date Noted  . PTSD (post-traumatic stress disorder) [F43.10] 10/22/2015    Priority: High  . Adjustment disorder with mixed disturbance of emotions and conduct [F43.25] 10/24/2015  . Suicide attempt (HCC) [T14.91] 10/22/2015  . Diabetes mellitus without complication (HCC) [E11.9] 10/22/2015    Total Time spent with patient: 30 minutes  Musculoskeletal: Strength & Muscle Tone: within normal limits Gait & Station: normal Patient leans: N/A  Psychiatric Specialty Exam: Review of Systems  Psychiatric/Behavioral: The patient is nervous/anxious.   All other systems reviewed and are negative.   Blood pressure (!) 141/90, pulse (!) 117, temperature 98 F (36.7 C), temperature source Oral, resp. rate 18, height 5\' 5"  (1.651 m), weight 85.7 kg (189 lb), SpO2 99 %.Body mass index is 31.45 kg/m.  General Appearance: Casual  Eye Contact::  Good  Speech:  Clear and Coherent409  Volume:  Normal  Mood:  Anxious  Affect:  Appropriate  Thought Process:  Goal Directed  Orientation:  Full (Time, Place, and Person)  Thought Content:  WDL  Suicidal Thoughts:  No  Homicidal Thoughts:  No  Memory:  Immediate;   Fair Recent;   Fair Remote;   Fair  Judgement:  Impaired  Insight:  Shallow  Psychomotor Activity:  Normal  Concentration:  Fair  Recall:  [employer]  Fund of Knowledge:Fair  Language: Fair  Akathisia:  No  Handed:  Right  AIMS (if indicated):     Assets:  Communication Skills Desire for Improvement Financial Resources/Insurance Housing Physical Health Resilience Social Support  Sleep:  Number of Hours: 7.45  Cognition: WNL  ADL's:  Intact   Mental Status Per Nursing Assessment::   On Admission:     Demographic Factors:  Caucasian  Loss Factors: Legal issues  Historical Factors: Impulsivity  Risk Reduction  Factors:   Sense of responsibility to family, Living with another person, especially a relative, Positive social support and Positive therapeutic relationship  Continued Clinical Symptoms:  Depression:   Impulsivity  Cognitive Features That Contribute To Risk:  None    Suicide Risk:  Minimal: No identifiable suicidal ideation.  Patients presenting with no risk factors but with morbid ruminations; may be classified as minimal risk based on the severity of the depressive symptoms    Plan Of Care/Follow-up recommendations:  Activity:  As tolerated. Diet:  Low sodium heart healthy ADA diet. Other:  Keep follow-up appointments.Keep   Kristine LineaJolanta Cyndee Giammarco, MD 10/27/2015, 4:02 PM

## 2015-10-27 NOTE — Progress Notes (Signed)
Patient discharged home. DC instructions provided, medications reviewed. Rx given. All questions answered. Belongings returned to patient from safe and locker. Denies SI, HI, AVH. 7 day supply of medications given to patient.

## 2015-10-27 NOTE — Plan of Care (Signed)
Problem: Medication: Goal: Compliance with prescribed medication regimen will improve Outcome: Progressing Pt has been compliant with prescribed medications   

## 2015-10-27 NOTE — Progress Notes (Signed)
Pt in bed sleeping wearing CPAP. Sitter at bedside.

## 2015-10-27 NOTE — Progress Notes (Signed)
Patient ID: Destiny HawthorneKristin M Wohlfarth, female   DOB: November 28, 1965, 50 y.o.   MRN: [id number] [facility]W notified of discharge.  Pt is established and verbalizes that [facility] and can [facility] Wednesday.  Unable to confirm, but Pt verbalizes being confident that she can navigate system at discharge.  7 day supply given to assist with delay in [place] [facility].  Jake SharkSara Chadrick Sprinkle, MSW, LCSW
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
## 2015-10-27 NOTE — Progress Notes (Signed)
  [facility] Discharge Plan :  Will you be returning to the same living situation after discharge:  Yes,    At discharge, do you have transportation home?: Yes,    Do you have the ability to pay for your medications: Yes,   will receive 7 day supply due to length of time that it can take to get [place] [facility] [place].  Release of information consent forms completed and in the chart;  Patient's signature needed at discharge.  Patient to Follow up at: Follow-up Information    [facility]. Schedule an appointment as soon as possible for a visit today.   Why:  Unable to secure appointment as [facility] is closed.  Please call and follow up for appointment.  Contact information: [address],  [place], [place] [address] [phone] Ext. [phone]  FAX-          Next level of care provider has access to [facility] Link:no  Safety Planning and Suicide Prevention discussed: Yes,     Have you used any form of tobacco in the last 30 days? (Cigarettes, Smokeless Tobacco, Cigars, and/or Pipes): No  Has patient been referred to the Quitline?: N/A patient is not a smoker  Patient has been referred for addiction treatment: Yes  Elcie Pelster, Cleda DaubSara P, MSW, LCSW 10/27/2015, 4:48 PM

## 2015-10-27 NOTE — Progress Notes (Signed)
D: Patient denies SI, HI, AVH. No complaints. Minimal interaction with staff and peers. A: Encouragement and support offered. Safety checks maintained. R: Pt receptive and remains safe on unit with q 15 min checks.

## 2015-10-27 NOTE — Plan of Care (Signed)
Problem: Safety: Goal: Ability to remain free from injury will improve Outcome: Progressing No self harm reported or observed   

## 2015-10-28 NOTE — Progress Notes (Signed)
Recreation Therapy Notes  INPATIENT RECREATION TR PLAN  Patient Details Name: Destiny Palmer MRN: [id number] DOB: 05/22/1965 Today's Date: 10/28/2015  Rec Therapy Plan Is patient appropriate for Therapeutic Recreation?: Yes Treatment times per week: At least once a week TR Treatment/Interventions: 1:1 session, Group participation (Comment) (Appropriate participation in daily recreational therapy tx)  Discharge Criteria Pt will be discharged from therapy if:: Treatment goals are met, Discharged Treatment plan/goals/alternatives discussed and agreed upon by:: Patient/family  Discharge Summary Short term goals set: See Care Plan Short term goals met: Complete Progress toward goals comments: One-to-one attended Which groups?: Other (Comment), Coping skills (Self-expression) One-to-one attended: Self-esteem stress management Reason goals not met: N/A Therapeutic equipment acquired: None Reason patient discharged from therapy: Discharge from hospital Pt/family agrees with progress & goals achieved: Yes Date patient discharged from therapy: 10/27/15   Leonette Monarch, LRT/CTRS 10/28/2015, 11:47 AM

## 2016-01-12 ENCOUNTER — Other Ambulatory Visit: Payer: Self-pay | Admitting: Family Medicine

## 2016-01-12 ENCOUNTER — Ambulatory Visit
Admission: RE | Admit: 2016-01-12 | Discharge: 2016-01-12 | Disposition: A | Discharge status: Home / Self Care | Payer: Disability Insurance | Source: Ambulatory Visit | Attending: Family Medicine | Admitting: Family Medicine

## 2016-01-12 DIAGNOSIS — S8992XA Unspecified injury of left lower leg, initial encounter: Secondary | ICD-10-CM | POA: Diagnosis present

## 2016-01-12 DIAGNOSIS — M5136 Other intervertebral disc degeneration, lumbar region: Secondary | ICD-10-CM | POA: Insufficient documentation

## 2016-01-12 DIAGNOSIS — G629 Polyneuropathy, unspecified: Secondary | ICD-10-CM

## 2016-01-12 DIAGNOSIS — I700 Atherosclerosis of aorta: Secondary | ICD-10-CM | POA: Diagnosis not present

## 2016-03-30 ENCOUNTER — Encounter: Payer: Self-pay | Admitting: Emergency Medicine

## 2016-03-30 ENCOUNTER — Emergency Department: Admitting: Anesthesiology

## 2016-03-30 ENCOUNTER — Encounter: Admission: EM | Payer: Self-pay | Source: Home / Self Care | Attending: Emergency Medicine

## 2016-03-30 ENCOUNTER — Emergency Department
Admission: EM | Admit: 2016-03-30 | Discharge: 2016-03-31 | Attending: Emergency Medicine | Admitting: Emergency Medicine

## 2016-03-30 ENCOUNTER — Emergency Department

## 2016-03-30 DIAGNOSIS — Z79899 Other long term (current) drug therapy: Secondary | ICD-10-CM | POA: Insufficient documentation

## 2016-03-30 DIAGNOSIS — S42252A Displaced fracture of greater tuberosity of left humerus, initial encounter for closed fracture: Secondary | ICD-10-CM | POA: Insufficient documentation

## 2016-03-30 DIAGNOSIS — I10 Essential (primary) hypertension: Secondary | ICD-10-CM | POA: Diagnosis not present

## 2016-03-30 DIAGNOSIS — E119 Type 2 diabetes mellitus without complications: Secondary | ICD-10-CM | POA: Insufficient documentation

## 2016-03-30 DIAGNOSIS — S43005A Unspecified dislocation of left shoulder joint, initial encounter: Secondary | ICD-10-CM

## 2016-03-30 DIAGNOSIS — G4730 Sleep apnea, unspecified: Secondary | ICD-10-CM | POA: Insufficient documentation

## 2016-03-30 DIAGNOSIS — F329 Major depressive disorder, single episode, unspecified: Secondary | ICD-10-CM | POA: Insufficient documentation

## 2016-03-30 DIAGNOSIS — Z794 Long term (current) use of insulin: Secondary | ICD-10-CM | POA: Insufficient documentation

## 2016-03-30 DIAGNOSIS — IMO0001 Reserved for inherently not codable concepts without codable children: Secondary | ICD-10-CM

## 2016-03-30 HISTORY — PX: SHOULDER CLOSED REDUCTION: SHX1051

## 2016-03-30 LAB — GLUCOSE, CAPILLARY: Glucose-Capillary: 284 mg/dL — ABNORMAL HIGH (ref 65–99)

## 2016-03-30 SURGERY — CLOSED REDUCTION, SHOULDER
Anesthesia: General | Site: Shoulder | Laterality: Left | Wound class: Clean

## 2016-03-30 MED ORDER — LIDOCAINE 2% (20 MG/ML) 5 ML SYRINGE
INTRAMUSCULAR | Status: AC
  Filled 2016-03-30: qty 5

## 2016-03-30 MED ORDER — SODIUM CHLORIDE 0.9 % IV BOLUS (SEPSIS)
1000.0000 mL | Freq: Once | INTRAVENOUS | Status: AC
  Administered 2016-03-30: 1000 mL via INTRAVENOUS

## 2016-03-30 MED ORDER — LIDOCAINE HCL (CARDIAC) 20 MG/ML IV SOLN
INTRAVENOUS | Status: DC | PRN
  Administered 2016-03-30: 30 mg via INTRAVENOUS

## 2016-03-30 MED ORDER — SODIUM CHLORIDE 0.9 % IV SOLN
Freq: Once | INTRAVENOUS | Status: AC
  Administered 2016-03-30: 22:00:00 via INTRAVENOUS

## 2016-03-30 MED ORDER — PROPOFOL 10 MG/ML IV BOLUS
INTRAVENOUS | Status: AC
Start: 2016-03-30 — End: 2016-03-30
  Filled 2016-03-30: qty 20

## 2016-03-30 MED ORDER — PROPOFOL 1000 MG/100ML IV EMUL
INTRAVENOUS | Status: AC
  Administered 2016-03-30: 81.6 mg via INTRAVENOUS
  Filled 2016-03-30: qty 100

## 2016-03-30 MED ORDER — PROPOFOL 10 MG/ML IV BOLUS
40.0000 mg | Freq: Once | INTRAVENOUS | Status: AC
  Administered 2016-03-30: 40 mg via INTRAVENOUS

## 2016-03-30 MED ORDER — PROPOFOL 10 MG/ML IV BOLUS
INTRAVENOUS | Status: DC | PRN
  Administered 2016-03-30: 160 mg via INTRAVENOUS

## 2016-03-30 MED ORDER — SODIUM CHLORIDE 0.9 % IV SOLN
INTRAVENOUS | Status: DC | PRN
  Administered 2016-03-30: via INTRAVENOUS

## 2016-03-30 MED ORDER — FENTANYL CITRATE (PF) 100 MCG/2ML IJ SOLN
INTRAMUSCULAR | Status: DC | PRN
Start: 2016-03-30 — End: 2016-03-30
  Administered 2016-03-30: 50 ug via INTRAVENOUS

## 2016-03-30 MED ORDER — PROPOFOL 10 MG/ML IV BOLUS
1.0000 mg/kg | Freq: Once | INTRAVENOUS | Status: AC
  Administered 2016-03-30: 81.6 mg via INTRAVENOUS

## 2016-03-30 MED ORDER — FENTANYL CITRATE (PF) 100 MCG/2ML IJ SOLN
INTRAMUSCULAR | Status: AC
  Filled 2016-03-30: qty 2

## 2016-03-30 MED ORDER — MIDAZOLAM HCL 2 MG/2ML IJ SOLN
INTRAMUSCULAR | Status: DC | PRN
  Administered 2016-03-30: 2 mg via INTRAVENOUS

## 2016-03-30 MED ORDER — MIDAZOLAM HCL 2 MG/2ML IJ SOLN
INTRAMUSCULAR | Status: AC
  Filled 2016-03-30: qty 2

## 2016-03-30 NOTE — Anesthesia Preprocedure Evaluation (Addendum)
Anesthesia Evaluation  Patient identified by MRN, date of birth, ID band Patient awake    Reviewed: Allergy & Precautions, H&P , NPO status , Patient's Chart, lab work & pertinent test results, reviewed documented beta blocker date and time   History of Anesthesia Complications Negative for: history of anesthetic complications  Airway Mallampati: II  TM Distance: >3 FB Neck ROM: full    Dental  (+) Teeth Intact, Caps, Missing   Pulmonary neg shortness of breath, sleep apnea , neg COPD, neg recent URI,    Pulmonary exam normal breath sounds clear to auscultation       Cardiovascular Exercise Tolerance: Good hypertension, (-) angina(-) CAD, (-) Past MI, (-) Cardiac Stents and (-) CABG Normal cardiovascular exam(-) dysrhythmias (-) Valvular Problems/Murmurs Rhythm:regular Rate:Normal     Neuro/Psych PSYCHIATRIC DISORDERS (Depression and PTSD) negative neurological ROS     GI/Hepatic Neg liver ROS, GERD  ,  Endo/Other  diabetes  Renal/GU negative Renal ROS  negative genitourinary   Musculoskeletal   Abdominal   Peds  Hematology negative hematology ROS (+)   Anesthesia Other Findings Past Medical History: No date: Diabetes mellitus without complication (HCC) No date: Hypertension   Reproductive/Obstetrics negative OB ROS                             Anesthesia Physical Anesthesia Plan  ASA: III  Anesthesia Plan: General   Post-op Pain Management:    Induction:   Airway Management Planned:   Additional Equipment:   Intra-op Plan:   Post-operative Plan:   Informed Consent: I have reviewed the patients History and Physical, chart, labs and discussed the procedure including the risks, benefits and alternatives for the proposed anesthesia with the patient or authorized representative who has indicated his/her understanding and acceptance.   Dental Advisory Given  Plan Discussed  with: Anesthesiologist, CRNA and Surgeon  Anesthesia Plan Comments:        Anesthesia Quick Evaluation
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
## 2016-03-30 NOTE — ED Notes (Signed)
Dr. Pershing ProudSchaevitz assessing pt.   Pt has sensation to L arm and can move fingers actively. Pt is holding L arm up on stretcher side rail.
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
## 2016-03-30 NOTE — Transfer of Care (Signed)
Immediate Anesthesia Transfer of Care Note  Patient: Destiny HawthorneKristin M Beechy  Procedure(s) Performed: Procedure(s): CLOSED REDUCTION SHOULDER (Left)  Patient Location: PACU  Anesthesia Type:General  Level of Consciousness: sedated  Airway & Oxygen Therapy: Patient Spontanous Breathing and Patient connected to face mask oxygen  Post-op Assessment: Report given to RN and Post -op Vital signs reviewed and stable  Post vital signs: Reviewed and stable  Last Vitals:  Vitals:   03/30/16 2146 03/30/16 2354  BP: (!) 165/115 (!) (P) 185/102  Pulse: 89   Resp: 18   Temp:  (P) 36.8 C    Last Pain:  Vitals:   03/30/16 2105  TempSrc:   PainSc: 10-Worst pain ever         Complications: No apparent anesthesia complications
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
## 2016-03-30 NOTE — Anesthesia Procedure Notes (Signed)
Procedure Name: LMA Insertion Date/Time: 03/30/2016 11:36 PM Performed by: Waldo LaineJUSTIS, Khalise Billard Pre-anesthesia Checklist: Patient identified, Emergency Drugs available, Suction available, Patient being monitored and Timeout performed Patient Re-evaluated:Patient Re-evaluated prior to inductionOxygen Delivery Method: Circle system utilized Preoxygenation: Pre-oxygenation with 100% oxygen Intubation Type: IV induction Ventilation: Mask ventilation without difficulty LMA: LMA inserted Tube type: Oral Number of attempts: 1 Placement Confirmation: positive ETCO2 and breath sounds checked- equal and bilateral Tube secured with: Tape Dental Injury: Teeth and Oropharynx as per pre-operative assessment

## 2016-03-30 NOTE — Brief Op Note (Signed)
03/30/2016  11:55 PM  PATIENT:  Destiny Palmer  51 y.o. female  PRE-OPERATIVE DIAGNOSIS:  Anterior dislocation of the left shoulder with greater tuberosity fracture  POST-OPERATIVE DIAGNOSIS:  Same  PROCEDURE:  Procedure(s): CLOSED REDUCTION SHOULDER (Left)  SURGEON:  Surgeon(s) and Role:    * Donato HeinzJames P Nateisha Moyd, MD - Primary  ASSISTANTS: none   ANESTHESIA:   general  EBL:  Total I/O In: 1000 [IV Piggyback:1000] Out: -   BLOOD ADMINISTERED:none  DRAINS: none   LOCAL MEDICATIONS USED:  NONE  SPECIMEN:  No Specimen  DISPOSITION OF SPECIMEN:  N/A  COUNTS:  YES  TOURNIQUET:  * No tourniquets in log *  DICTATION: .Dragon Dictation  PLAN OF CARE: Discharge to home after PACU  PATIENT DISPOSITION:  PACU - hemodynamically stable.   Delay start of Pharmacological VTE agent (>24hrs) due to surgical blood loss or risk of bleeding: not applicable

## 2016-03-31 LAB — GLUCOSE, CAPILLARY: GLUCOSE-CAPILLARY: 212 mg/dL — AB (ref 65–99)

## 2016-03-31 MED ORDER — HYDROCODONE-ACETAMINOPHEN 5-325 MG PO TABS: 1.0000 | ORAL_TABLET | ORAL | 0 refills | Status: DC | PRN

## 2016-03-31 MED ORDER — PROMETHAZINE HCL 25 MG/ML IJ SOLN
6.2500 mg | INTRAMUSCULAR | Status: DC | PRN
Start: 2016-03-31 — End: 2016-03-31

## 2016-03-31 MED ORDER — FENTANYL CITRATE (PF) 100 MCG/2ML IJ SOLN: 25.0000 ug | INTRAMUSCULAR | Status: DC | PRN

## 2016-04-01 NOTE — Anesthesia Postprocedure Evaluation (Signed)
Anesthesia Post Note  Patient: Mikeal HawthorneKristin M Tait  Procedure(s) Performed: Procedure(s) (LRB): CLOSED REDUCTION SHOULDER (Left)  Patient location during evaluation: PACU Anesthesia Type: General Level of consciousness: awake and alert Pain management: pain level controlled Vital Signs Assessment: post-procedure vital signs reviewed and stable Respiratory status: spontaneous breathing, nonlabored ventilation, respiratory function stable and patient connected to nasal cannula oxygen Cardiovascular status: blood pressure returned to baseline and stable Postop Assessment: no signs of nausea or vomiting Anesthetic complications: no     Last Vitals:  Vitals:   03/31/16 0019 03/31/16 0031  BP: (!) 177/100   Pulse: 98 95  Resp: 18 17  Temp: 37.5 C     Last Pain:  Vitals:   03/31/16 0019  TempSrc:   PainSc: Asleep                 Lenard SimmerAndrew Naitik Hermann

## 2016-04-02 ENCOUNTER — Encounter: Payer: Self-pay | Admitting: Orthopedic Surgery

## 2017-07-28 IMAGING — DX DG SHOULDER 2+V*L*
3 series · 3 of 3 positions shown · non-contrast
Comparison: LEFT shoulder radiograph March 30, 2016 at 2128 hours

CLINICAL DATA: Postreduction films, follow-up LEFT shoulder
fracture dislocation.

EXAM:
LEFT SHOULDER - 2+ VIEW

[shoulder axial]
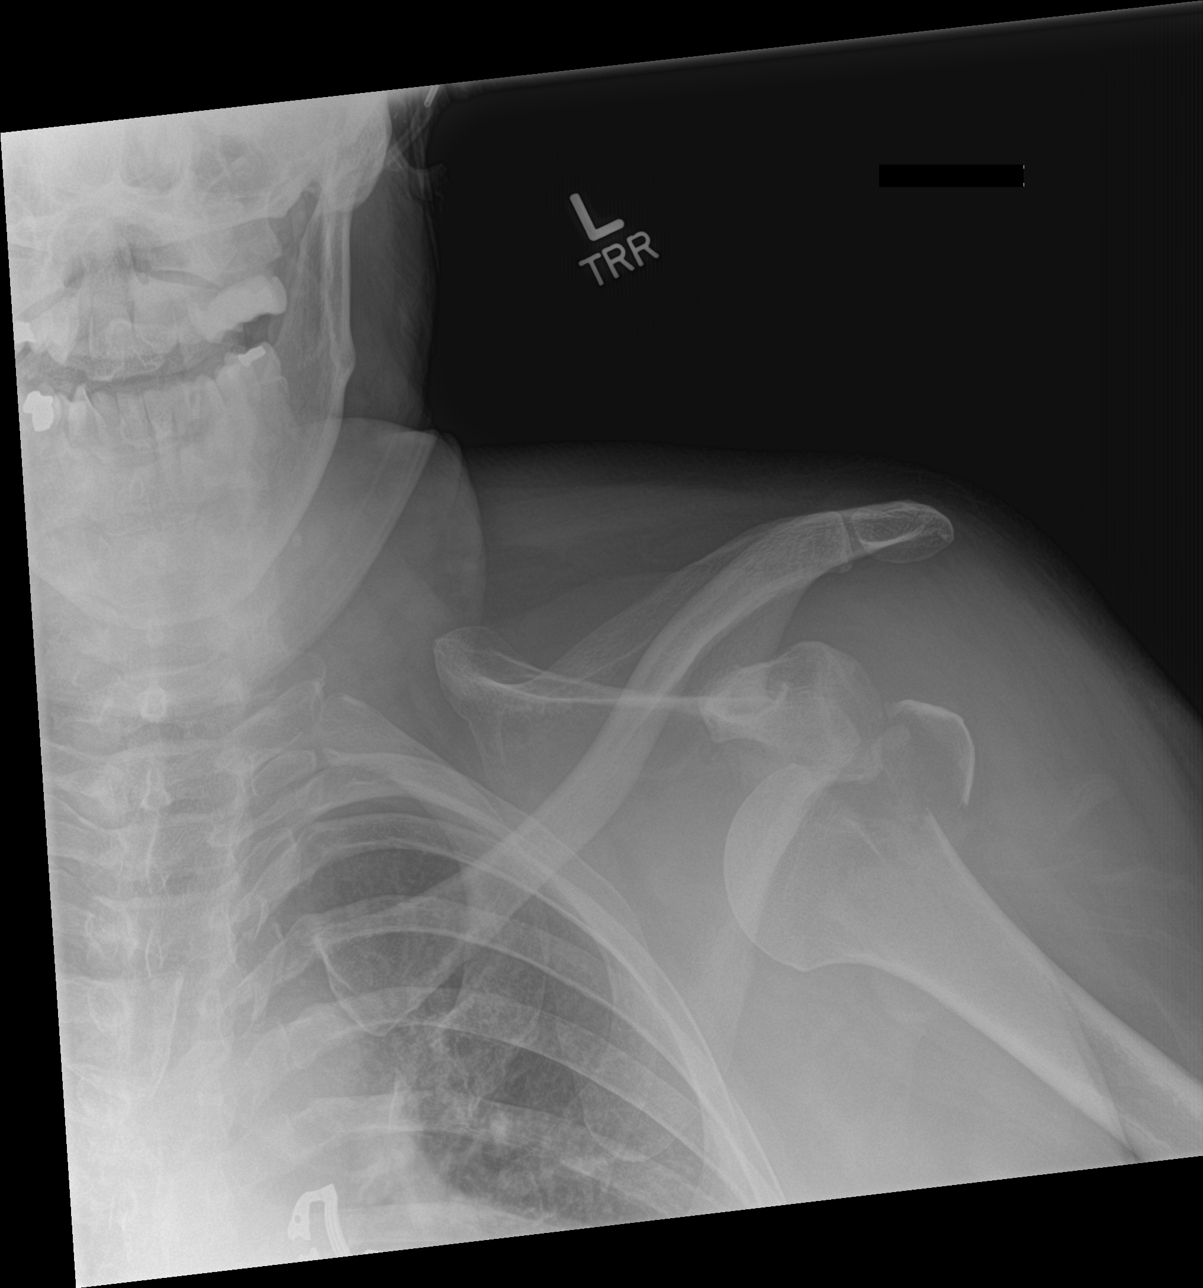

[shoulder ap]
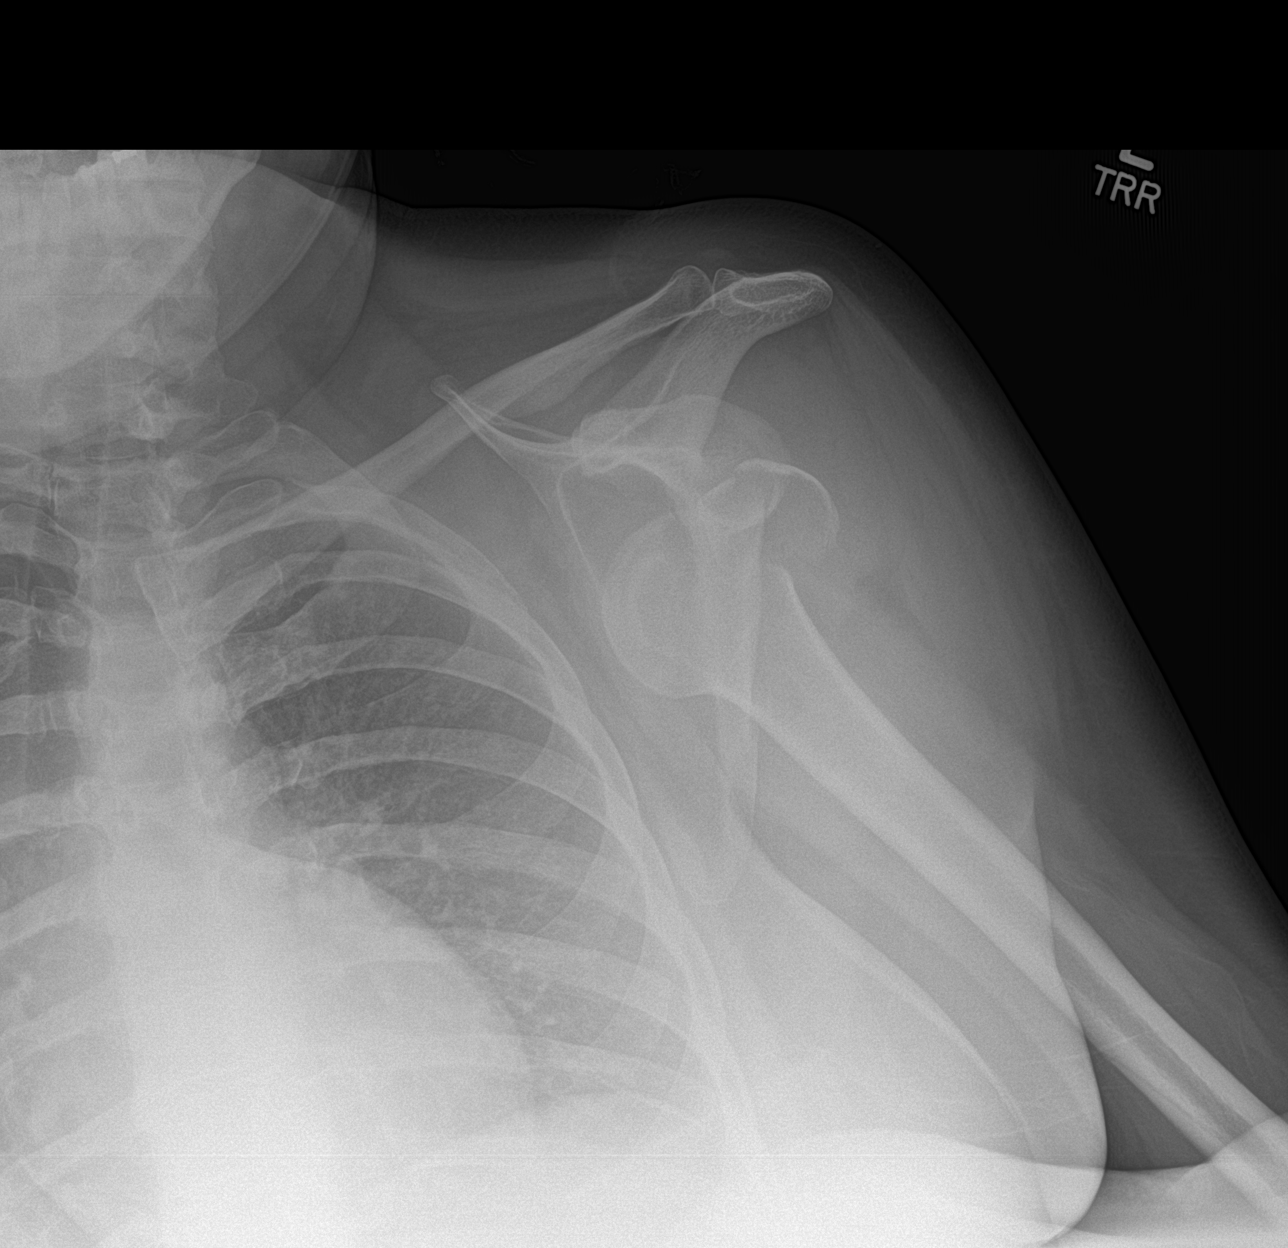

[shoulder obl]
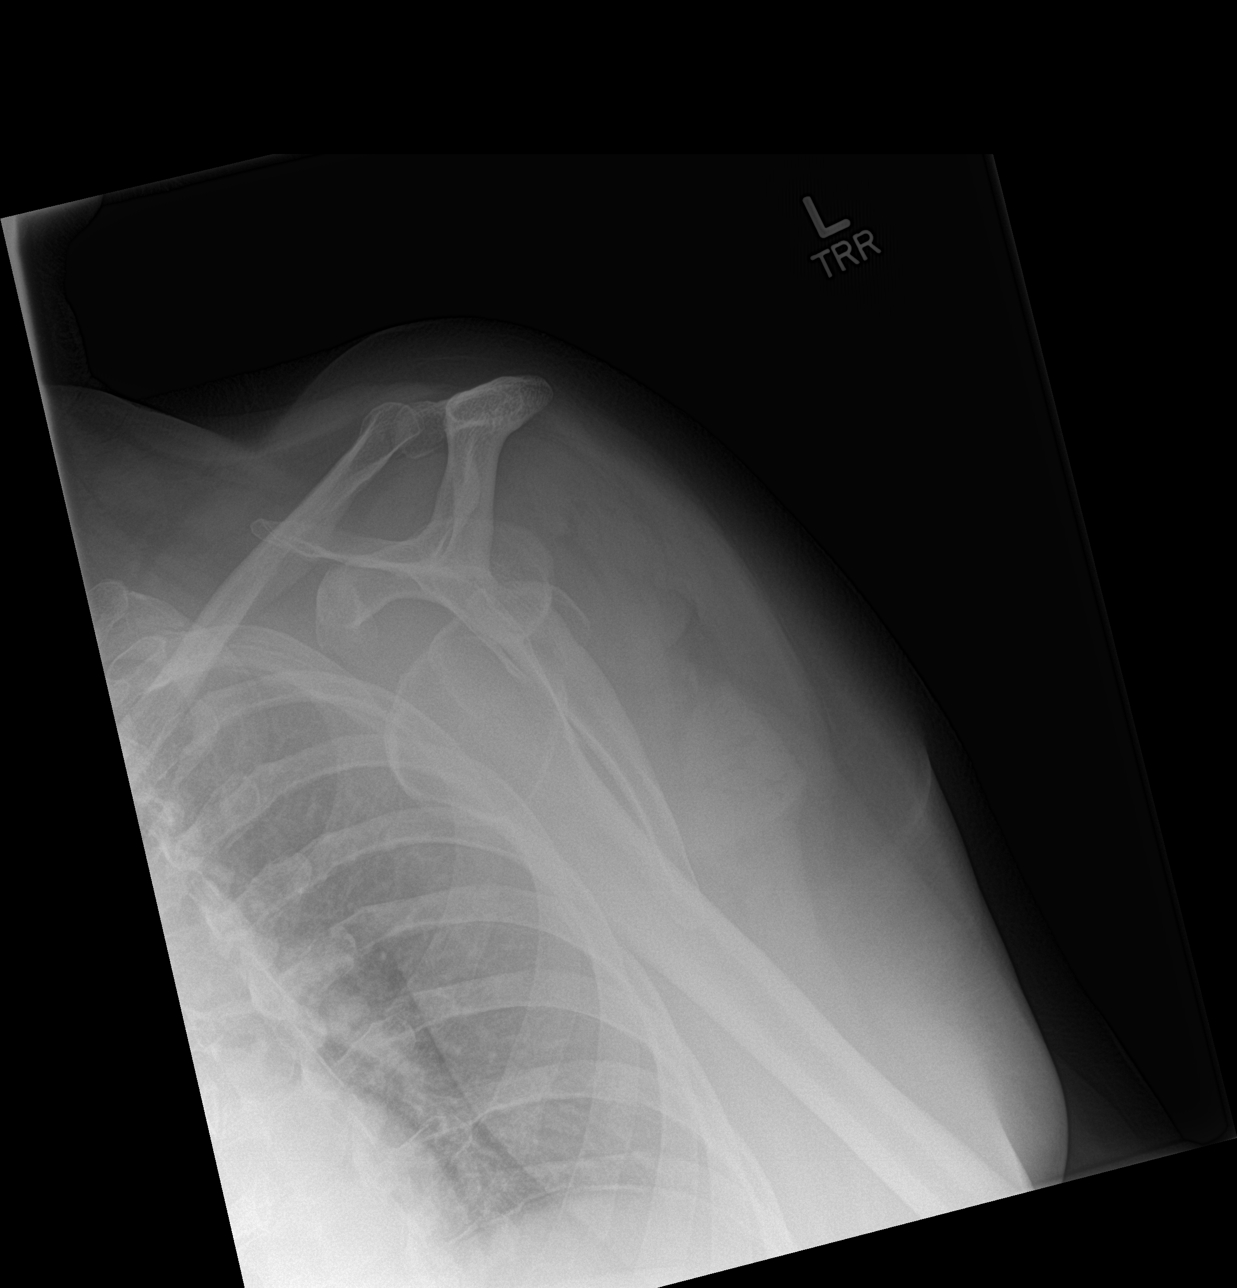

[3 of 3 positions shown; findings below may reference images not displayed]

FINDINGS: Unchanged appearance of acute comminuted humeral head fracture
involving the greater tuberosity with distraction, Anteroinferior
dislocation. Humeral head appears impacted along the inferior
glenoid rim. Acromioclavicular joint space intact. No destructive
bony lesions. Soft tissue planes are nonsuspicious.
IMPRESSION: Unchanged appearance of LEFT shoulder fracture-dislocation.

## 2017-07-29 IMAGING — CR DG SHOULDER 1V*L*
1 series · 1 of 1 positions shown · non-contrast
Comparison: Left shoulder radiograph 03/30/2016

CLINICAL DATA: Shoulder dislocation

EXAM:
DG C-ARM 61-120 MIN; LEFT SHOULDER - 1 VIEW

[cont.]
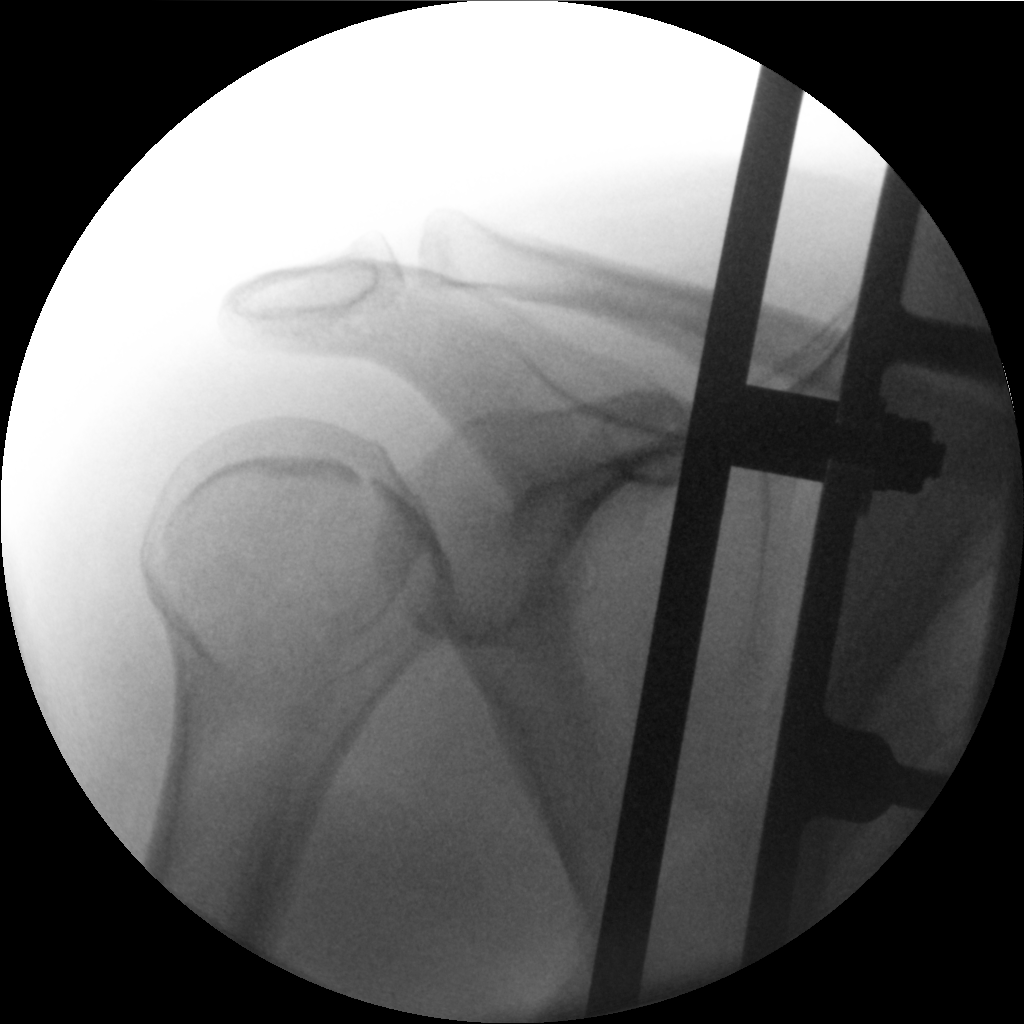

[1 of 1 positions shown; findings below may reference images not displayed]

FINDINGS: A single fluoroscopic image is provided following reduction of left
shoulder dislocation.
IMPRESSION: Reduction of left shoulder dislocation.

## 2022-07-22 ENCOUNTER — Observation Stay: Payer: Self-pay

## 2022-07-22 ENCOUNTER — Inpatient Hospital Stay
Admission: EM | Admit: 2022-07-22 | Discharge: 2022-08-03 | DRG: 065 | Disposition: A | Discharge status: Skilled Nursing Facility | Payer: Self-pay | Attending: Internal Medicine | Admitting: Internal Medicine

## 2022-07-22 ENCOUNTER — Emergency Department: Payer: Self-pay

## 2022-07-22 DIAGNOSIS — R471 Dysarthria and anarthria: Secondary | ICD-10-CM | POA: Diagnosis present

## 2022-07-22 DIAGNOSIS — F32A Depression, unspecified: Secondary | ICD-10-CM | POA: Diagnosis not present

## 2022-07-22 DIAGNOSIS — R739 Hyperglycemia, unspecified: Secondary | ICD-10-CM | POA: Diagnosis present

## 2022-07-22 DIAGNOSIS — Z794 Long term (current) use of insulin: Secondary | ICD-10-CM

## 2022-07-22 DIAGNOSIS — I639 Cerebral infarction, unspecified: Principal | ICD-10-CM | POA: Diagnosis present

## 2022-07-22 DIAGNOSIS — E1165 Type 2 diabetes mellitus with hyperglycemia: Secondary | ICD-10-CM | POA: Diagnosis present

## 2022-07-22 DIAGNOSIS — G9349 Other encephalopathy: Secondary | ICD-10-CM | POA: Diagnosis not present

## 2022-07-22 DIAGNOSIS — F4310 Post-traumatic stress disorder, unspecified: Secondary | ICD-10-CM | POA: Diagnosis not present

## 2022-07-22 DIAGNOSIS — G9341 Metabolic encephalopathy: Secondary | ICD-10-CM | POA: Diagnosis not present

## 2022-07-22 DIAGNOSIS — G8191 Hemiplegia, unspecified affecting right dominant side: Secondary | ICD-10-CM | POA: Diagnosis not present

## 2022-07-22 DIAGNOSIS — R29706 NIHSS score 6: Secondary | ICD-10-CM | POA: Diagnosis present

## 2022-07-22 DIAGNOSIS — R262 Difficulty in walking, not elsewhere classified: Secondary | ICD-10-CM | POA: Diagnosis present

## 2022-07-22 DIAGNOSIS — Z9151 Personal history of suicidal behavior: Secondary | ICD-10-CM | POA: Diagnosis not present

## 2022-07-22 DIAGNOSIS — E785 Hyperlipidemia, unspecified: Secondary | ICD-10-CM | POA: Diagnosis not present

## 2022-07-22 DIAGNOSIS — I63539 Cerebral infarction due to unspecified occlusion or stenosis of unspecified posterior cerebral artery: Secondary | ICD-10-CM | POA: Diagnosis not present

## 2022-07-22 DIAGNOSIS — Z79899 Other long term (current) drug therapy: Secondary | ICD-10-CM | POA: Diagnosis not present

## 2022-07-22 DIAGNOSIS — I10 Essential (primary) hypertension: Secondary | ICD-10-CM | POA: Diagnosis not present

## 2022-07-22 DIAGNOSIS — F4320 Adjustment disorder, unspecified: Secondary | ICD-10-CM | POA: Diagnosis not present

## 2022-07-22 DIAGNOSIS — Z888 Allergy status to other drugs, medicaments and biological substances status: Secondary | ICD-10-CM

## 2022-07-22 DIAGNOSIS — R29810 Facial weakness: Secondary | ICD-10-CM | POA: Diagnosis present

## 2022-07-22 DIAGNOSIS — W19XXXA Unspecified fall, initial encounter: Secondary | ICD-10-CM | POA: Diagnosis not present

## 2022-07-22 DIAGNOSIS — Z91148 Patient's other noncompliance with medication regimen for other reason: Secondary | ICD-10-CM | POA: Diagnosis not present

## 2022-07-22 DIAGNOSIS — Z825 Family history of asthma and other chronic lower respiratory diseases: Secondary | ICD-10-CM

## 2022-07-22 DIAGNOSIS — R4182 Altered mental status, unspecified: Secondary | ICD-10-CM

## 2022-07-22 DIAGNOSIS — Z8673 Personal history of transient ischemic attack (TIA), and cerebral infarction without residual deficits: Secondary | ICD-10-CM

## 2022-07-22 LAB — CBC WITH DIFFERENTIAL/PLATELET
Abs Immature Granulocytes: 0.02 10*3/uL (ref 0.00–0.07)
Basophils Absolute: 0 10*3/uL (ref 0.0–0.1)
Basophils Relative: 0 %
Eosinophils Absolute: 0.1 10*3/uL (ref 0.0–0.5)
Eosinophils Relative: 1 %
HCT: 51 % — ABNORMAL HIGH (ref 36.0–46.0)
Hemoglobin: 17.1 g/dL — ABNORMAL HIGH (ref 12.0–15.0)
Immature Granulocytes: 0 %
Lymphocytes Relative: 25 %
Lymphs Abs: 1.7 10*3/uL (ref 0.7–4.0)
MCH: 30.3 pg (ref 26.0–34.0)
MCHC: 33.5 g/dL (ref 30.0–36.0)
MCV: 90.3 fL (ref 80.0–100.0)
Monocytes Absolute: 0.4 10*3/uL (ref 0.1–1.0)
Monocytes Relative: 6 %
Neutro Abs: 4.6 10*3/uL (ref 1.7–7.7)
Neutrophils Relative %: 68 %
Platelets: 269 10*3/uL (ref 150–400)
RBC: 5.65 MIL/uL — ABNORMAL HIGH (ref 3.87–5.11)
RDW: 11.9 % (ref 11.5–15.5)
WBC: 6.9 10*3/uL (ref 4.0–10.5)
nRBC: 0 % (ref 0.0–0.2)

## 2022-07-22 LAB — URINALYSIS, ROUTINE W REFLEX MICROSCOPIC
Bilirubin Urine: NEGATIVE
Glucose, UA: >=500 mg/dL — AB
Hgb urine dipstick: NEGATIVE
Ketones, ur: 20 mg/dL — AB
Nitrite: NEGATIVE
Protein, ur: NEGATIVE mg/dL
Specific Gravity, Urine: 1.032 — ABNORMAL HIGH (ref 1.005–1.030)
pH: 6 (ref 5.0–8.0)

## 2022-07-22 LAB — URINE DRUG SCREEN, QUALITATIVE (ARMC ONLY)
Amphetamines, Ur Screen: NOT DETECTED
Barbiturates, Ur Screen: NOT DETECTED
Benzodiazepine, Ur Scrn: NOT DETECTED
Cannabinoid 50 Ng, Ur ~~LOC~~: NOT DETECTED
Cocaine Metabolite,Ur ~~LOC~~: NOT DETECTED
MDMA (Ecstasy)Ur Screen: NOT DETECTED
Methadone Scn, Ur: NOT DETECTED
Opiate, Ur Screen: NOT DETECTED
Phencyclidine (PCP) Ur S: NOT DETECTED
Tricyclic, Ur Screen: NOT DETECTED

## 2022-07-22 LAB — PHOSPHORUS: Phosphorus: 3.3 mg/dL (ref 2.5–4.6)

## 2022-07-22 LAB — MAGNESIUM: Magnesium: 1.9 mg/dL (ref 1.7–2.4)

## 2022-07-22 LAB — COMPREHENSIVE METABOLIC PANEL
ALT: 22 U/L (ref 0–44)
AST: 33 U/L (ref 15–41)
Albumin: 3.2 g/dL — ABNORMAL LOW (ref 3.5–5.0)
Alkaline Phosphatase: 84 U/L (ref 38–126)
Anion gap: 9 (ref 5–15)
BUN: 15 mg/dL (ref 6–20)
CO2: 27 mmol/L (ref 22–32)
Calcium: 8.9 mg/dL (ref 8.9–10.3)
Chloride: 101 mmol/L (ref 98–111)
Creatinine, Ser: 0.65 mg/dL (ref 0.44–1.00)
GFR, Estimated: >60 mL/min (ref >60)
Glucose, Bld: 310 mg/dL — ABNORMAL HIGH (ref 70–99)
Potassium: 5.1 mmol/L (ref 3.5–5.1)
Sodium: 137 mmol/L (ref 135–145)
Total Bilirubin: 1.5 mg/dL — ABNORMAL HIGH (ref 0.3–1.2)
Total Protein: 6.8 g/dL (ref 6.5–8.1)

## 2022-07-22 LAB — BETA-HYDROXYBUTYRIC ACID: Beta-Hydroxybutyric Acid: 1.95 mmol/L — ABNORMAL HIGH (ref 0.05–0.27)

## 2022-07-22 LAB — CBG MONITORING, ED
Glucose-Capillary: 478 mg/dL — ABNORMAL HIGH (ref 70–99)
Glucose-Capillary: 264 mg/dL — ABNORMAL HIGH (ref 70–99)

## 2022-07-22 LAB — ETHANOL: Alcohol, Ethyl (B): <10 mg/dL (ref <10)

## 2022-07-22 LAB — HEMOGLOBIN A1C
Hgb A1c MFr Bld: 14.5 % — ABNORMAL HIGH (ref 4.8–5.6)
Mean Plasma Glucose: 369.45 mg/dL

## 2022-07-22 LAB — BLOOD GAS, VENOUS
Acid-Base Excess: 1.9 mmol/L (ref 0.0–2.0)
Bicarbonate: 28.2 mmol/L — ABNORMAL HIGH (ref 20.0–28.0)
pO2, Ven: <31 mmHg — CL (ref 32–45)

## 2022-07-22 LAB — LACTIC ACID, PLASMA: Lactic Acid, Venous: 1.4 mmol/L (ref 0.5–1.9)

## 2022-07-22 MED ORDER — LACTATED RINGERS IV BOLUS
1000.0000 mL | Freq: Once | INTRAVENOUS | Status: AC
  Administered 2022-07-22: 1000 mL via INTRAVENOUS

## 2022-07-22 MED ORDER — ATORVASTATIN CALCIUM 20 MG PO TABS
40.0000 mg | ORAL_TABLET | Freq: Every day | ORAL | Status: DC
  Administered 2022-07-23 – 2022-08-03 (×12): 40 mg via ORAL
  Filled 2022-07-22 (×12): qty 2

## 2022-07-22 MED ORDER — INSULIN ASPART 100 UNIT/ML IJ SOLN
0.0000 [IU] | Freq: Three times a day (TID) | INTRAMUSCULAR | Status: DC
  Administered 2022-07-23: 5 [IU] via SUBCUTANEOUS
  Administered 2022-07-23: 3 [IU] via SUBCUTANEOUS
  Administered 2022-07-23: 5 [IU] via SUBCUTANEOUS
  Administered 2022-07-24: 8 [IU] via SUBCUTANEOUS
  Administered 2022-07-24: 5 [IU] via SUBCUTANEOUS
  Administered 2022-07-25: 8 [IU] via SUBCUTANEOUS
  Administered 2022-07-25 – 2022-07-26 (×3): 3 [IU] via SUBCUTANEOUS
  Administered 2022-07-26: 11 [IU] via SUBCUTANEOUS
  Administered 2022-07-26 – 2022-07-27 (×2): 5 [IU] via SUBCUTANEOUS
  Administered 2022-07-27: 11 [IU] via SUBCUTANEOUS
  Administered 2022-07-27 – 2022-07-28 (×2): 8 [IU] via SUBCUTANEOUS
  Administered 2022-07-28 (×2): 3 [IU] via SUBCUTANEOUS
  Administered 2022-07-29: 11 [IU] via SUBCUTANEOUS
  Administered 2022-07-29: 2 [IU] via SUBCUTANEOUS
  Administered 2022-07-29 – 2022-07-30 (×2): 3 [IU] via SUBCUTANEOUS
  Administered 2022-07-30: 5 [IU] via SUBCUTANEOUS
  Administered 2022-07-30: 8 [IU] via SUBCUTANEOUS
  Administered 2022-07-31: 3 [IU] via SUBCUTANEOUS
  Administered 2022-07-31: 11 [IU] via SUBCUTANEOUS
  Administered 2022-07-31: 3 [IU] via SUBCUTANEOUS
  Administered 2022-08-01: 11 [IU] via SUBCUTANEOUS
  Administered 2022-08-01 – 2022-08-02 (×3): 3 [IU] via SUBCUTANEOUS
  Administered 2022-08-02: 5 [IU] via SUBCUTANEOUS
  Administered 2022-08-02: 2 [IU] via SUBCUTANEOUS
  Administered 2022-08-03: 5 [IU] via SUBCUTANEOUS
  Filled 2022-07-22 (×30): qty 1

## 2022-07-22 MED ORDER — HYDROXYZINE HCL 25 MG PO TABS: 25.0000 mg | ORAL_TABLET | Freq: Three times a day (TID) | ORAL | Status: DC | PRN

## 2022-07-22 MED ORDER — LACTATED RINGERS IV SOLN: INTRAVENOUS | Status: DC

## 2022-07-22 MED ORDER — ASPIRIN 81 MG PO TBEC
81.0000 mg | DELAYED_RELEASE_TABLET | Freq: Every day | ORAL | Status: DC
  Administered 2022-07-22 – 2022-07-23 (×2): 81 mg via ORAL
  Filled 2022-07-22 (×2): qty 1

## 2022-07-22 MED ORDER — OXYCODONE HCL 5 MG PO TABS
5.0000 mg | ORAL_TABLET | ORAL | Status: DC | PRN
  Administered 2022-07-28: 5 mg via ORAL
  Filled 2022-07-22: qty 1

## 2022-07-22 MED ORDER — INSULIN ASPART 100 UNIT/ML IJ SOLN
6.0000 [IU] | Freq: Once | INTRAMUSCULAR | Status: AC
  Administered 2022-07-22: 6 [IU] via SUBCUTANEOUS
  Filled 2022-07-22: qty 1

## 2022-07-22 MED ORDER — STROKE: EARLY STAGES OF RECOVERY BOOK: Freq: Once | Status: DC

## 2022-07-22 MED ORDER — ACETAMINOPHEN 325 MG PO TABS: 650.0000 mg | ORAL_TABLET | Freq: Four times a day (QID) | ORAL | Status: DC | PRN

## 2022-07-22 MED ORDER — ONDANSETRON HCL 4 MG PO TABS: 4.0000 mg | ORAL_TABLET | Freq: Four times a day (QID) | ORAL | Status: DC | PRN

## 2022-07-22 MED ORDER — ALBUTEROL SULFATE (2.5 MG/3ML) 0.083% IN NEBU: 2.5000 mg | INHALATION_SOLUTION | RESPIRATORY_TRACT | Status: DC | PRN

## 2022-07-22 MED ORDER — ACETAMINOPHEN 650 MG RE SUPP: 650.0000 mg | Freq: Four times a day (QID) | RECTAL | Status: DC | PRN

## 2022-07-22 MED ORDER — ONDANSETRON HCL 4 MG/2ML IJ SOLN: 4.0000 mg | Freq: Four times a day (QID) | INTRAMUSCULAR | Status: DC | PRN

## 2022-07-22 MED ORDER — RISPERIDONE 1 MG PO TABS
2.0000 mg | ORAL_TABLET | Freq: Every day | ORAL | Status: DC
  Administered 2022-07-22: 2 mg via ORAL
  Filled 2022-07-22: qty 2

## 2022-07-22 MED ORDER — CLOPIDOGREL BISULFATE 75 MG PO TABS
75.0000 mg | ORAL_TABLET | Freq: Every day | ORAL | Status: DC
  Administered 2022-07-22 – 2022-08-03 (×13): 75 mg via ORAL
  Filled 2022-07-22 (×13): qty 1

## 2022-07-22 MED ORDER — INSULIN GLARGINE-YFGN 100 UNIT/ML ~~LOC~~ SOLN
10.0000 [IU] | Freq: Every day | SUBCUTANEOUS | Status: DC
  Administered 2022-07-22: 10 [IU] via SUBCUTANEOUS
  Filled 2022-07-22 (×2): qty 0.1

## 2022-07-22 MED ORDER — FLUOXETINE HCL 20 MG PO CAPS
40.0000 mg | ORAL_CAPSULE | Freq: Every day | ORAL | Status: DC
  Administered 2022-07-23 – 2022-08-03 (×12): 40 mg via ORAL
  Filled 2022-07-22 (×12): qty 2

## 2022-07-22 MED ORDER — SODIUM CHLORIDE 0.9 % IV SOLN
1.0000 g | Freq: Once | INTRAVENOUS | Status: AC
  Administered 2022-07-22: 1 g via INTRAVENOUS
  Filled 2022-07-22: qty 10

## 2022-07-22 MED ORDER — ENOXAPARIN SODIUM 40 MG/0.4ML IJ SOSY
40.0000 mg | PREFILLED_SYRINGE | INTRAMUSCULAR | Status: DC
  Administered 2022-07-22 – 2022-08-02 (×12): 40 mg via SUBCUTANEOUS
  Filled 2022-07-22 (×12): qty 0.4

## 2022-07-22 MED ORDER — INSULIN ASPART 100 UNIT/ML IJ SOLN
0.0000 [IU] | Freq: Every day | INTRAMUSCULAR | Status: DC
  Administered 2022-07-22 – 2022-07-24 (×2): 3 [IU] via SUBCUTANEOUS
  Administered 2022-07-25: 5 [IU] via SUBCUTANEOUS
  Administered 2022-07-26: 3 [IU] via SUBCUTANEOUS
  Administered 2022-07-27 – 2022-07-28 (×2): 2 [IU] via SUBCUTANEOUS
  Filled 2022-07-22 (×6): qty 1

## 2022-07-22 MED ORDER — HYDRALAZINE HCL 20 MG/ML IJ SOLN: 10.0000 mg | Freq: Four times a day (QID) | INTRAMUSCULAR | Status: DC | PRN

## 2022-07-22 NOTE — ED Notes (Signed)
Pt returned from MRI °

## 2022-07-22 NOTE — ED Notes (Signed)
Lab called for recollect on light green top. Recollect sent at this time

## 2022-07-22 NOTE — ED Triage Notes (Signed)
Pt presents to the ED from home via [facility]. Pt has been lethargic with nausea and vomiting. Pt lethargic and falling asleep during triage. Pt A&Ox3. Pt was either taken off diabetic medication or not started on it per EMS. Pt's CBG over 600 with EMS.

## 2022-07-22 NOTE — H&P (Signed)
History and Physical    Destiny Palmer XLK:[id number] DOB: February 21, 1966 DOA: 07/22/2022  PCP: [facility]  Patient coming from: Home  I have personally briefly reviewed patient's old medical records in [facility]  Chief Complaint: AMS  HPI: Destiny Palmer is a 57 y.o. female with medical history significant of diabetes mellitus reportedly nonadherent to diabetic regimen who presents to the ED with chief complaint of altered mentation.  Patient has been lethargic with nausea and vomiting.  Remains alert and oriented x 3.  Apparently CBG was over 600 with EMS.  310 on BMP on arrival.  Apparently had some slurred speech and difficulty walking.  Patient unable to provide much history.  On my evaluation patient is resting comfortably in bed.  She is alert and oriented x 3.  She is a little confused on the location, states we are in [place].  Does have a psychiatric history including adjustment disorder, history of suicide attempt in 2017, PTSD.  Psychiatric regimen is listed on her home medication reconciliation but there are no documented outpatient visits for several years so unclear exactly what she has been taken.  Urinalysis shows significant glucosuria and some evidence of leukocytes.  No fever.  CT head demonstrates evidence of old CVA versus less likely space-occupying lesion.  Laboratory investigation overall unrevealing apart from some hyperglycemia.  Remainder of CMP within normal limits.  Urine drug screen negative.  Chest x-ray negative  ED Course: Given altered mentation without other obvious cause a dose of ceftriaxone was ordered.  Blood cultures were ordered.  Lactic acid was ordered.  The studies are pending at time of admission.  Hospitalist contacted for admission.  Review of Systems: As per HPI otherwise 14 point review of systems negative.    Past Medical History:  Diagnosis Date   Diabetes mellitus without complication (HCC)    Hypertension     Past  Surgical History:  Procedure Laterality Date   NO PAST SURGERIES     SHOULDER CLOSED REDUCTION Left 03/30/2016   Procedure: CLOSED REDUCTION SHOULDER;  Surgeon: Donato Heinz, MD;  Location: [facility] ORS;  Service: Orthopedics;  Laterality: Left;     reports that she has never smoked. She has never used smokeless tobacco. She reports current alcohol use. She reports that she does not use drugs.  No Known Allergies  Family History  Problem Relation Age of Onset   COPD Mother    No known family history of diabetes  Prior to Admission medications   Medication Sig Start Date End Date Taking? Authorizing Provider  FLUoxetine (PROZAC) 40 MG capsule Take 1 capsule (40 mg total) by mouth daily. 10/27/15   Destiny Palmer, Destiny Conte B, MD  HYDROcodone-acetaminophen (NORCO) 5-325 MG tablet Take 1-2 tablets by mouth every 4 (four) hours as needed for moderate pain. 03/31/16   Destiny Palmer, Destiny Labrador, MD  hydrOXYzine (ATARAX/VISTARIL) 25 MG tablet Take 1 tablet (25 mg total) by mouth 3 (three) times daily as needed for anxiety. 10/27/15   Destiny Palmer, Destiny B, MD  insulin aspart (NOVOLOG) 100 UNIT/ML injection Inject 3 Units into the skin 3 (three) times daily with meals. 10/27/15   Destiny Palmer, Destiny B, MD  insulin aspart (NOVOLOG) 100 UNIT/ML injection Inject 0-15 Units into the skin 3 (three) times daily with meals. 10/27/15   Destiny Palmer, Destiny B, MD  insulin glargine (LANTUS) 100 UNIT/ML injection Inject 0.26 mLs (26 Units total) into the skin daily. 10/27/15   Destiny Palmer, Destiny Goodie, MD  OXcarbazepine (TRILEPTAL) 150 MG  tablet Take 1 tablet (150 mg total) by mouth 2 (two) times daily. 10/27/15   Destiny Palmer, Destiny B, MD  risperiDONE (RISPERDAL) 2 MG tablet Take 1 tablet (2 mg total) by mouth at bedtime. 10/27/15   Destiny Prows, MD    Physical Exam: Vitals:   07/22/22 1505 07/22/22 1530 07/22/22 1615 07/22/22 1700  BP: (!) 182/84 (!) 172/96 (!) 148/69 (!) 156/82  Pulse: 69 60 64 74  Resp: 18 15 18 16   Temp:       TempSrc:      SpO2: 99% 98% 99% 98%   Vitals:   07/22/22 1505 07/22/22 1530 07/22/22 1615 07/22/22 1700  BP: (!) 182/84 (!) 172/96 (!) 148/69 (!) 156/82  Pulse: 69 60 64 74  Resp: 18 15 18 16   Temp:      TempSrc:      SpO2: 99% 98% 99% 98%   General: Appears lethargic otherwise no apparent distress HEENT: Normocephalic, atraumatic Neck, supple, trachea midline, no tenderness Heart: Regular rate and rhythm, S1/S2 normal, no murmurs Lungs: Clear to auscultation bilaterally, no adventitious sounds, normal work of breathing Abdomen: Soft, nontender, nondistended, positive bowel sounds Extremities: Normal, atraumatic, no clubbing or cyanosis, normal muscle tone Skin: No rashes or lesions, normal color Neurologic: Cranial nerves grossly intact, sensation intact, alert and oriented x3 Psychiatric: Blunted affect   Labs on Admission: I have personally reviewed following labs and imaging studies  CBC: Recent Labs  Lab 07/22/22 1309  WBC 6.9  NEUTROABS 4.6  HGB 17.1*  HCT 51.0*  MCV 90.3  PLT 269   Basic Metabolic Panel: Recent Labs  Lab 07/22/22 1548  NA 137  K 5.1  CL 101  CO2 27  GLUCOSE 310*  BUN 15  CREATININE 0.65  CALCIUM 8.9  MG 1.9  PHOS 3.3   GFR: CrCl cannot be calculated (Unknown ideal weight.). Liver Function Tests: Recent Labs  Lab 07/22/22 1548  AST 33  ALT 22  ALKPHOS 84  BILITOT 1.5*  PROT 6.8  ALBUMIN 3.2*   No results for input(s): "LIPASE", "AMYLASE" in the last 168 hours. No results for input(s): "AMMONIA" in the last 168 hours. Coagulation Profile: No results for input(s): "INR", "PROTIME" in the last 168 hours. Cardiac Enzymes: No results for input(s): "CKTOTAL", "CKMB", "CKMBINDEX", "TROPONINI" in the last 168 hours. BNP (last 3 results) No results for input(s): "PROBNP" in the last 8760 hours. HbA1C: No results for input(s): "HGBA1C" in the last 72 hours. CBG: Recent Labs  Lab 07/22/22 1303  GLUCAP 478*   Lipid  Profile: No results for input(s): "CHOL", "HDL", "LDLCALC", "TRIG", "CHOLHDL", "LDLDIRECT" in the last 72 hours. Thyroid Function Tests: No results for input(s): "TSH", "T4TOTAL", "FREET4", "T3FREE", "THYROIDAB" in the last 72 hours. Anemia Panel: No results for input(s): "VITAMINB12", "FOLATE", "FERRITIN", "TIBC", "IRON", "RETICCTPCT" in the last 72 hours. Urine analysis:    Component Value Date/Time   COLORURINE YELLOW (A) 07/22/2022 1630   APPEARANCEUR CLEAR (A) 07/22/2022 1630   LABSPEC 1.032 (H) 07/22/2022 1630   PHURINE 6.0 07/22/2022 1630   GLUCOSEU >=500 (A) 07/22/2022 1630   HGBUR NEGATIVE 07/22/2022 1630   BILIRUBINUR NEGATIVE 07/22/2022 1630   KETONESUR 20 (A) 07/22/2022 1630   PROTEINUR NEGATIVE 07/22/2022 1630   NITRITE NEGATIVE 07/22/2022 1630   LEUKOCYTESUR TRACE (A) 07/22/2022 1630    Radiological Exams on Admission: CT HEAD WO CONTRAST ( )  Result Date: 07/22/2022 CLINICAL DATA:  Altered mental status, lethargy EXAM: CT HEAD WITHOUT CONTRAST TECHNIQUE: Contiguous axial images  were obtained from the base of the skull through the vertex without intravenous contrast. RADIATION DOSE REDUCTION: This exam was performed according to the departmental dose-optimization program which includes automated exposure control, adjustment of the mA and/or kV according to patient size and/or use of iterative reconstruction technique. COMPARISON:  None Available. FINDINGS: Brain: There are no signs of bleeding within the cranium. There is a 2.6 cm area of low-density in posterior left occipital lobe. There is subcentimeter low-density in the left basal ganglia. Cortical sulci are prominent. Vascular: Scattered arterial calcifications are seen. Skull: Unremarkable. Sinuses/Orbits: There is mucosal thickening in ethmoid sinus. Other: None. IMPRESSION: There are no signs of bleeding within the cranium. There is no focal mass effect. There is 2.6 cm low-density in the posterior left occipital lobe  suggesting possible encephalomalacia from previous infarction. Less likely possibility would be space-occupying lesion. Follow-up MRI may be considered. The subcentimeter low-density in the left basal ganglia suggesting possible old lacunar infarct. Atrophy. Electronically Signed   By: Ernie Avena M.D.   On: 07/22/2022 14:02   DG Chest Portable 1 View  Result Date: 07/22/2022 CLINICAL DATA:  Shortness of breath EXAM: PORTABLE CHEST 1 VIEW COMPARISON:  10/21/2015 FINDINGS: Cardiac size is within normal limits. There is poor inspiration. There are no signs of pulmonary edema or focal pulmonary consolidation. There is no pleural effusion or pneumothorax. IMPRESSION: There are no signs of pulmonary edema or focal pulmonary consolidation. Electronically Signed   By: Ernie Avena M.D.   On: 07/22/2022 13:56    EKG: Independently reviewed.  Normal sinus rhythm  Assessment/Plan Principal Problem:   Acute metabolic encephalopathy  Acute metabolic encephalopathy This is of unclear etiology.  Patient unable to provide accurate history.  There is some concern for urinary tract infection.  Also unclear whether CT head findings are of clinical significance.  Initially blood glucose was 600 however at this time patient is blood glucose is 310 with a normal anion gap.  Does not warrant insulin gtt. and ICU admission. Plan: Place in observation IV fluids Insulin regimen Empiric IV Rocephin Follow blood and urine cultures Check MRI brain  Diabetes mellitus Patient apparently not adherent to outpatient diabetic regimen Currently blood sugars 310 Plan: Semglee 10 units nightly Moderate sliding scale Nightly sliding scale Carb modified diet Check hemoglobin A1c Diabetes coordinator consult  Essential hypertension Patient does not appear to be on any medications Hydralazine 10 mg IV every 6 hours as needed  Depression Psychiatric disorder not otherwise specified PTA Prozac As needed  Atarax Nightly respite oh   DVT prophylaxis: SQ Lovenox Code Status: Full Family Communication: None at bedside Disposition Plan: Return to previous home environment Consults called: None at this time Admission status: Observation, MedSurg   Tresa Moore MD [facility]   If 7PM-7AM, please contact night-coverage   07/22/2022, 5:39 PM

## 2022-07-22 NOTE — ED Notes (Signed)
Pt ambulated to commode at bedside in room to give a urine sample. Pt ambulated back to bed. Urine sent in tube station to lab

## 2022-07-22 NOTE — ED Notes (Signed)
Messaged covering provider regarding MRI results.

## 2022-07-22 NOTE — Progress Notes (Signed)
       CROSS COVER NOTE  NAME: Destiny Palmer MRN: [id number] DOB : 05-31-1965    HPI/Events of Note   Report:nurse informed me about MRI showing acute strok  On review of chart: History of hypertension and diabetes admitted this afternoon for metabolic encephalopathy suspected from dehydration and severe hyperglycemia. Initial CT head without contrast without acute findings MRI IMPRESSION: Multifocal acute ischemia within the left PCA territory, including the occipital lobe, temporal lobe and thalamus. No hemorrhage or mass effect.     Assessment and  Interventions   Assessment: Neuro patient alert knows self and place. In discussion with bedside nurse newly found inability to raise arm against gravity not noted 1830 when patient was eating. Code stroke initiated. Teleneuro services paged Plan: Per neuro rec p;avix 75 daily for 90 days, mod intensity statin 0 atorvastatin,, echo, daily baby asa - all ordered Patient then had change in assessment NIH 5 to 8 with new dysarthria and facial droop Code stroke and repeat head CT again ordered IMPRESSION: 1. 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 No superimposed acute intracranial hemorrhage. No new abnormal mass effect or midline shift.  Tele neuro evaluated again - no new recommendations except routine CTA head and neck  - needs ordered  After further review, patient had been administered risperdal between the first and second nero exams. Discontinued risperdal         Donnie Mesa NP [facility]

## 2022-07-22 NOTE — ED Provider Notes (Addendum)
Care assumed of patient from outgoing provider.  See their note for initial history, exam and plan.  Clinical Course as of 07/22/22 1511  Thu Jul 22, 2022  1510 Patient presents to the emergency department with altered mental status.  History of diabetes and according to family members has been noncompliant with home medications.  Presented to the emergency department confused with no focal neurologic deficits.  Significant hyperglycemia and multiple CMP's that have been hemolyzed.  Currently waiting for repeat CMP.  Elevated beta hydroxybutyrate.  pH on VBG within normal limits.  Started on 2 L LR.  CT scan of the head with old CVA versus space-occupying lesion and recommended MRI. [SM]    Clinical Course User Index [SM] Corena Herter, MD   Patient without criteria for DKA.  Does have hyperglycemia.  Given 2 L of IV fluids and glucose is currently in the 300s.  UA with questionable urinary tract infection, does not have symptoms but does have ongoing altered mental status.  UDS is negative.  Possible HHS.  Will add on blood cultures, lactic acid and give IV Rocephin.  Urine sent for culture.  Consulted hospitalist for admission.  Corena Herter, MD 07/22/22 1511    Corena Herter, MD 07/22/22 1714

## 2022-07-22 NOTE — ED Notes (Signed)
CODE STROKE called to Melburn Hake Gabriel Earing) per Cliffton Asters and A. Anise Salvo

## 2022-07-22 NOTE — ED Notes (Signed)
EKG handed directly to Oakley, MD

## 2022-07-22 NOTE — ED Notes (Addendum)
This RN was performing an NIH on pt and found that patient was unable to lift right arm against gravity. Code stroke activated. Jon Billings, NP at bedside.

## 2022-07-22 NOTE — ED Notes (Signed)
Called lab to inquire on status of CMP. Lab stated that they never got the recollected green top on this patient. Collected a third green top on this patient and sent it in tube station with charge RN present. Called the lab to ensure that they got the sample and Judeth Cornfield verbalized that they have received it. MD aware.

## 2022-07-22 NOTE — ED Notes (Signed)
Called lab for blood draw on patient.

## 2022-07-22 NOTE — ED Provider Notes (Signed)
[facility] Provider Note    Event Date/Time   First MD Initiated Contact with Patient 07/22/22 1257     (approximate)   History   Hyperglycemia   HPI  Destiny Palmer is a 57 y.o. female past with history of diabetes and hypertension presents after mental status.  I would not present for EMS report but per nursing they were called out for altered mental status.  Family noted her to have some slurred speech and difficulty walking.  Currently she stopped taking her metformin.  Blood sugar read over 600 for EMS.  Patient not really able provide much history because of altered mental status.  She is denying complaints currently.     Past Medical History:  Diagnosis Date   Diabetes mellitus without complication (HCC)    Hypertension     Patient Active Problem List   Diagnosis Date Noted   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   Suicide attempt (HCC) 10/22/2015   PTSD (post-traumatic stress disorder) 10/22/2015   Diabetes mellitus without complication (HCC) 10/22/2015     Physical Exam  Triage Vital Signs: ED Triage Vitals  Enc Vitals Group     BP 07/22/22 1258 (!) 160/91     Pulse Rate 07/22/22 1258 67     Resp 07/22/22 1258 18     Temp 07/22/22 1258 98.4 F (36.9 C)     Temp Source 07/22/22 1258 Oral     SpO2 07/22/22 1258 100 %     Weight --      Height --      Head Circumference --      Peak Flow --      Pain Score 07/22/22 1305 0     Pain Loc --      Pain Edu? --      Excl. in GC? --     Most recent vital signs: Vitals:   07/22/22 1445 07/22/22 1505  BP: (!) 182/92 (!) 182/84  Pulse:  69  Resp:  18  Temp:    SpO2:  99%     General: Ill-appearing, sleepy, eyes closed but does awaken to voice CV:  Good peripheral perfusion. No edema Resp:  Normal effort.  Abd:  No distention. Soft, nontender throughout Neuro:             Awake, Alert, Oriented x 2-tells me the year is 2026 but knows  she is in the hospital Other:  Patient is able to grip symmetrically bilateral upper extremities able to lift both legs off the bed tongue is midline face is symmetric, pupils are pinpoint bilaterally   ED Results / Procedures / Treatments  Labs (all labs ordered are listed, but only abnormal results are displayed) Labs Reviewed  CBC WITH DIFFERENTIAL/PLATELET - Abnormal; Notable for the following components:      Result Value   RBC 5.65 (*)    Hemoglobin 17.1 (*)    HCT 51.0 (*)    All other components within normal limits  BLOOD GAS, VENOUS - Abnormal; Notable for the following components:   pO2, Ven <31 (*)    Bicarbonate 28.2 (*)    All other components within normal limits  BETA-HYDROXYBUTYRIC ACID - Abnormal; Notable for the following components:   Beta-Hydroxybutyric Acid 1.95 (*)    All other components within normal limits  CBG MONITORING, ED - Abnormal; Notable for the following components:   Glucose-Capillary 478 (*)    All other components within  normal limits  ETHANOL  URINE DRUG SCREEN, QUALITATIVE ([facility] ONLY)  URINALYSIS, ROUTINE W REFLEX MICROSCOPIC  COMPREHENSIVE METABOLIC PANEL  MAGNESIUM  PHOSPHORUS     EKG  EKG interpretation performed by myself: LAFB, NSR, nml intervals, no acute ischemic changes    RADIOLOGY I reviewed and interpreted the CXR which does not show any acute cardiopulmonary process    PROCEDURES:  Critical Care performed: No  Procedures  The patient is on the cardiac monitor to evaluate for evidence of arrhythmia and/or significant heart rate changes.   MEDICATIONS ORDERED IN ED: Medications  lactated ringers bolus 1,000 mL (0 mLs Intravenous Stopped 07/22/22 1507)  lactated ringers bolus 1,000 mL (0 mLs Intravenous Stopped 07/22/22 1507)     IMPRESSION / MDM / ASSESSMENT AND PLAN / ED COURSE  I reviewed the triage vital signs and the nursing notes.                              Patient's presentation is most consistent  with acute presentation with potential threat to life or bodily function.  Differential diagnosis includes, but is not limited to, DKA, HHS, intracranial hemorrhage, intoxication, withdrawal, electrolyte abnormality, infection  Patient is a 57 year old female with history of diabetes who presents with altered mental status.  Patient apparently found to be altered by family.  Blood sugar greater than 600 for EMS.  On arrival to ED she is hypertensive sats are borderline 92%.  Patient is sleepy awakens to voice she is following commands and moving all of her extremities thinks it is 2026.  Does fall asleep mid exam.  Abdominal exam is benign.  Blood sugar here is 478.  Apparently patient stopped taking her metformin.  Will need broad altered mental status workup will obtain CT head labs to rule out DKA.  Will give 2 L of fluid.  EKG is reassuring.  Anticipate admission.  Labs are notable for normal pH.  Beta hydroxy butyrate mildly elevated.  CBC without leukocytosis.  CMP has been hemolyzed x 2.   On reassessment patient is somewhat more awake but is still fairly sleepy.  She denies drug use.  Says she lives with her mom but does not know mom's phone number.  I tried calling the contact number in the chart but it went right to voicemail.  Reports I am not able to obtain any collateral but patient certainly does not seem to be at what I expect her baseline will be.  I signed that time provider patient pending admission after CMP.  Urinalysis and UDS still pending as well.   Clinical Course as of 07/22/22 1518  Thu Jul 22, 2022  1510 Patient presents to the emergency department with altered mental status.  History of diabetes and according to family members has been noncompliant with home medications.  Presented to the emergency department confused with no focal neurologic deficits.  Significant hyperglycemia and multiple CMP's that have been hemolyzed.  Currently waiting for repeat CMP.  Elevated beta  hydroxybutyrate.  pH on VBG within normal limits.  Started on 2 L LR.  CT scan of the head with old CVA versus space-occupying lesion and recommended MRI. [SM]    Clinical Course User Index [SM] Corena Herter, MD     FINAL CLINICAL IMPRESSION(S) / ED DIAGNOSES   Final diagnoses:  Hyperglycemia  Altered mental status, unspecified altered mental status type     Rx / DC Orders   ED  Discharge Orders     None        Note:  This document was prepared using Dragon voice recognition software and may include unintentional dictation errors.   Georga Hacking, MD 07/22/22 [phone]

## 2022-07-22 NOTE — ED Notes (Signed)
Food tray delivered at this time

## 2022-07-22 NOTE — ED Notes (Signed)
Pt to MRI

## 2022-07-22 NOTE — ED Notes (Signed)
MD at bedside during triage

## 2022-07-22 NOTE — Progress Notes (Addendum)
Code stroke activated at 2122. ROS 2116 per RN at bedside. Staff received MRI results, performed NIHSS and patient unable to move right arm or ID objects.  MRI 1. LKW unknown.  Imaging performed prior to activation. Dr. Gerre Pebbles connected at 2132.   Dorathy Daft, Telestroke RN

## 2022-07-23 ENCOUNTER — Observation Stay (HOSPITAL_COMMUNITY)
Admit: 2022-07-23 | Discharge: 2022-07-23 | Disposition: A | Discharge status: Home / Self Care | Payer: Self-pay | Attending: Acute Care | Admitting: Acute Care

## 2022-07-23 ENCOUNTER — Other Ambulatory Visit: Payer: Self-pay

## 2022-07-23 ENCOUNTER — Observation Stay: Payer: Self-pay

## 2022-07-23 ENCOUNTER — Inpatient Hospital Stay: Payer: Self-pay

## 2022-07-23 DIAGNOSIS — Z9151 Personal history of suicidal behavior: Secondary | ICD-10-CM | POA: Diagnosis not present

## 2022-07-23 DIAGNOSIS — R29810 Facial weakness: Secondary | ICD-10-CM | POA: Diagnosis present

## 2022-07-23 DIAGNOSIS — Z794 Long term (current) use of insulin: Secondary | ICD-10-CM | POA: Diagnosis not present

## 2022-07-23 DIAGNOSIS — G9341 Metabolic encephalopathy: Secondary | ICD-10-CM | POA: Diagnosis not present

## 2022-07-23 DIAGNOSIS — R4182 Altered mental status, unspecified: Secondary | ICD-10-CM

## 2022-07-23 DIAGNOSIS — I6389 Other cerebral infarction: Secondary | ICD-10-CM

## 2022-07-23 DIAGNOSIS — I639 Cerebral infarction, unspecified: Secondary | ICD-10-CM | POA: Diagnosis present

## 2022-07-23 DIAGNOSIS — R262 Difficulty in walking, not elsewhere classified: Secondary | ICD-10-CM | POA: Diagnosis present

## 2022-07-23 DIAGNOSIS — R471 Dysarthria and anarthria: Secondary | ICD-10-CM | POA: Diagnosis present

## 2022-07-23 DIAGNOSIS — G8191 Hemiplegia, unspecified affecting right dominant side: Secondary | ICD-10-CM | POA: Diagnosis present

## 2022-07-23 DIAGNOSIS — F4320 Adjustment disorder, unspecified: Secondary | ICD-10-CM | POA: Diagnosis present

## 2022-07-23 DIAGNOSIS — F32A Depression, unspecified: Secondary | ICD-10-CM | POA: Diagnosis present

## 2022-07-23 DIAGNOSIS — I63539 Cerebral infarction due to unspecified occlusion or stenosis of unspecified posterior cerebral artery: Secondary | ICD-10-CM | POA: Diagnosis present

## 2022-07-23 DIAGNOSIS — E785 Hyperlipidemia, unspecified: Secondary | ICD-10-CM | POA: Diagnosis present

## 2022-07-23 DIAGNOSIS — Z825 Family history of asthma and other chronic lower respiratory diseases: Secondary | ICD-10-CM | POA: Diagnosis not present

## 2022-07-23 DIAGNOSIS — E1165 Type 2 diabetes mellitus with hyperglycemia: Secondary | ICD-10-CM | POA: Diagnosis present

## 2022-07-23 DIAGNOSIS — W19XXXA Unspecified fall, initial encounter: Secondary | ICD-10-CM | POA: Diagnosis not present

## 2022-07-23 DIAGNOSIS — I10 Essential (primary) hypertension: Secondary | ICD-10-CM | POA: Diagnosis present

## 2022-07-23 DIAGNOSIS — R29706 NIHSS score 6: Secondary | ICD-10-CM | POA: Diagnosis present

## 2022-07-23 DIAGNOSIS — Z91148 Patient's other noncompliance with medication regimen for other reason: Secondary | ICD-10-CM | POA: Diagnosis not present

## 2022-07-23 DIAGNOSIS — R739 Hyperglycemia, unspecified: Secondary | ICD-10-CM | POA: Diagnosis present

## 2022-07-23 DIAGNOSIS — Z79899 Other long term (current) drug therapy: Secondary | ICD-10-CM | POA: Diagnosis not present

## 2022-07-23 DIAGNOSIS — F4310 Post-traumatic stress disorder, unspecified: Secondary | ICD-10-CM | POA: Diagnosis present

## 2022-07-23 DIAGNOSIS — G9349 Other encephalopathy: Secondary | ICD-10-CM | POA: Diagnosis present

## 2022-07-23 DIAGNOSIS — Z8673 Personal history of transient ischemic attack (TIA), and cerebral infarction without residual deficits: Secondary | ICD-10-CM | POA: Diagnosis not present

## 2022-07-23 DIAGNOSIS — Z888 Allergy status to other drugs, medicaments and biological substances status: Secondary | ICD-10-CM | POA: Diagnosis not present

## 2022-07-23 LAB — COMPREHENSIVE METABOLIC PANEL
ALT: 23 U/L (ref 0–44)
AST: 23 U/L (ref 15–41)
Albumin: 2.9 g/dL — ABNORMAL LOW (ref 3.5–5.0)
Alkaline Phosphatase: 71 U/L (ref 38–126)
Anion gap: 8 (ref 5–15)
BUN: 15 mg/dL (ref 6–20)
CO2: 29 mmol/L (ref 22–32)
Calcium: 8.5 mg/dL — ABNORMAL LOW (ref 8.9–10.3)
Chloride: 102 mmol/L (ref 98–111)
Creatinine, Ser: 0.62 mg/dL (ref 0.44–1.00)
GFR, Estimated: >60 mL/min (ref >60)
Glucose, Bld: 244 mg/dL — ABNORMAL HIGH (ref 70–99)
Potassium: 3.6 mmol/L (ref 3.5–5.1)
Sodium: 139 mmol/L (ref 135–145)
Total Bilirubin: 0.8 mg/dL (ref 0.3–1.2)
Total Protein: 6.1 g/dL — ABNORMAL LOW (ref 6.5–8.1)

## 2022-07-23 LAB — MAGNESIUM: Magnesium: 1.8 mg/dL (ref 1.7–2.4)

## 2022-07-23 LAB — PHOSPHORUS: Phosphorus: 3 mg/dL (ref 2.5–4.6)

## 2022-07-23 LAB — CBC
HCT: 39.7 % (ref 36.0–46.0)
Hemoglobin: 13.1 g/dL (ref 12.0–15.0)
MCH: 30 pg (ref 26.0–34.0)
MCHC: 33 g/dL (ref 30.0–36.0)
MCV: 90.8 fL (ref 80.0–100.0)
Platelets: 190 10*3/uL (ref 150–400)
RBC: 4.37 MIL/uL (ref 3.87–5.11)
RDW: 11.8 % (ref 11.5–15.5)
WBC: 6.3 10*3/uL (ref 4.0–10.5)
nRBC: 0 % (ref 0.0–0.2)

## 2022-07-23 LAB — ECHOCARDIOGRAM COMPLETE
AR max vel: 1.74 cm2
AV Area VTI: 2.07 cm2
AV Area mean vel: 1.87 cm2
AV Mean grad: 4 mmHg
AV Peak grad: 6.9 mmHg
Ao pk vel: 1.31 m/s
Area-P 1/2: 2.87 cm2
Height: 66 in
MV VTI: 2.06 cm2
S' Lateral: 2.1 cm
Weight: 2432 oz

## 2022-07-23 LAB — URINE CULTURE: Culture: 40000 — AB

## 2022-07-23 LAB — LIPID PANEL
Cholesterol: 224 mg/dL — ABNORMAL HIGH (ref 0–200)
HDL: 30 mg/dL — ABNORMAL LOW (ref >40)
LDL Cholesterol: 158 mg/dL — ABNORMAL HIGH (ref 0–99)
Total CHOL/HDL Ratio: 7.5 RATIO
Triglycerides: 181 mg/dL — ABNORMAL HIGH (ref <150)
VLDL: 36 mg/dL (ref 0–40)

## 2022-07-23 LAB — CBG MONITORING, ED
Glucose-Capillary: 168 mg/dL — ABNORMAL HIGH (ref 70–99)
Glucose-Capillary: 212 mg/dL — ABNORMAL HIGH (ref 70–99)
Glucose-Capillary: 214 mg/dL — ABNORMAL HIGH (ref 70–99)
Glucose-Capillary: 233 mg/dL — ABNORMAL HIGH (ref 70–99)
Glucose-Capillary: 258 mg/dL — ABNORMAL HIGH (ref 70–99)

## 2022-07-23 LAB — HIV ANTIBODY (ROUTINE TESTING W REFLEX): HIV Screen 4th Generation wRfx: NONREACTIVE

## 2022-07-23 LAB — GLUCOSE, CAPILLARY: Glucose-Capillary: 173 mg/dL — ABNORMAL HIGH (ref 70–99)

## 2022-07-23 MED ORDER — IOHEXOL 350 MG/ML SOLN
75.0000 mL | Freq: Once | INTRAVENOUS | Status: AC | PRN
  Administered 2022-07-23: 75 mL via INTRAVENOUS

## 2022-07-23 MED ORDER — SODIUM CHLORIDE 0.9 % IV SOLN: INTRAVENOUS | Status: DC

## 2022-07-23 MED ORDER — INSULIN GLARGINE-YFGN 100 UNIT/ML ~~LOC~~ SOLN
15.0000 [IU] | Freq: Every day | SUBCUTANEOUS | Status: DC
  Administered 2022-07-23 – 2022-07-24 (×2): 15 [IU] via SUBCUTANEOUS
  Filled 2022-07-23 (×2): qty 0.15

## 2022-07-23 NOTE — Inpatient Diabetes Management (Addendum)
Inpatient Diabetes Program Recommendations  AACE/ADA: New Consensus Statement on Inpatient Glycemic Control (2015)  Target Ranges:  Prepandial:   less than 140 mg/dL      Peak postprandial:   less than 180 mg/dL (1-2 hours)      Critically ill patients:  140 - 180 mg/dL    Latest Reference Range & Units 07/22/22 15:48  Glucose 70 - 99 mg/dL 161 (H)  (H): Data is abnormally high  Latest Reference Range & Units 07/22/22 13:09  Hemoglobin A1C 4.8 - 5.6 % 14.5 (H)  369 mg/dl  (H): Data is abnormally high  Latest Reference Range & Units 07/22/22 13:03 07/22/22 21:27 07/22/22 23:44 07/23/22 00:17  Glucose-Capillary 70 - 99 mg/dL 096 (H)  6 units Novolog @1821  264 (H)  3 units Novolog  10 units Semglee 233 (H) 258 (H)  (H): Data is abnormally high  Latest Reference Range & Units 07/23/22 08:13  Glucose-Capillary 70 - 99 mg/dL 045 (H)  5 units Novolog   (H): Data is abnormally high  Admit with: AMS/ CVA/ Hyperglycemia  History: DM  Home DM Meds: Novolog 0-15 units TID       Novolog 3 units TID       Lantus 26 units Daily       (NOT taking Insulin per Home Med Rec)  Current Orders: Novolog Moderate Correction Scale/ SSI (0-15 units) TID AC + HS     Semglee 10 units QHS    Per Home Med Rec, pt NOT taking insulin as prescribed  Current A1c of 14.5% reflective of this info  PCP listed as [facility]    MD- Note CBG 212 this AM  Please consider increasing the Semglee slightly to 15 units QHS   Addendum 1pm--Met w/  pt down in the ED.  Asked pt several questions about her diabetes care at home.   Pt answered "NO" to all my questions with delayed responses.  Questions asked: 1. Do you have CBG meter at home?- No 2. Do you take insulin at home? -No 3. Have you ever taken insulin at home?- No 4. Do you have PCP? -No (even though [facility]) 5. Do you know what an A1c is?- No 6. Do you know what your blood sugars should be?- No I am not 100% sure pt  understood all of my questions and do not feel pt was able to understand the info I provided to her about her A1c.  I did tell pt we will be giving her insulin in the hospital and would she be agreeable to getting insulin in the hospital and her delayed response was "OK".       --Will follow patient during hospitalization--  Ambrose Finland RN, MSN, CDCES Diabetes Coordinator Inpatient Glycemic Control Team Team Pager: [phone] (8a-5p)    --Will follow patient during hospitalization--  Ambrose Finland RN, MSN, CDCES Diabetes Coordinator Inpatient Glycemic Control Team Team Pager: [phone] (8a-5p)

## 2022-07-23 NOTE — ED Notes (Signed)
This RN spoke with Ermalene Searing [phone]), patient's sister (OK to speak with family member per patient) and updated her on patient's status.

## 2022-07-23 NOTE — ED Notes (Signed)
Pt to CT monitored.

## 2022-07-23 NOTE — Evaluation (Signed)
Physical Therapy Evaluation Patient Details Name: Destiny Palmer MRN: [id number] DOB: 02/09/1966 Today's Date: 07/23/2022  History of Present Illness  Destiny Palmer is a 56yoF who comes to [facility] on 5/2 with AMS, BG in 600s on arrival. Code stoke called later in evening after acute onset motor deficits RUE. PMH: DM, HTN, SI, Lt shoulder dislocation s/p closed reduction 2018. MRI brain revealing "multifocal acute ischemia within the left PCA territory, including the occipital lobe, temporal lobe and thalamus." FU head CT next day shows stable infarcts. At baseline pt denies any impairment of gait or balance, not use of AD, reports to live with mother whom she provides caregiver assistance.  Clinical Impression  Pt in ED gurney on entry, asleep but awakens to voice and light. Pt interactive, but limited in providing info given dense expressive deficits. Assessment reveling of severe weakness in RUE, heavy weakness in RLE. Pt does well with bed mobility efforts, but needs some physical assistance given acute motor deficits. Pt able to establish sitting balance at EOB. Unable to safely attempt transfers at this time given limitations of ED setup, will defer to later once pt has transfer to the floor. Elevated pt about protecting RUE and imb elevation. Discussed recommendation for RN. Pt left in bed, elevated all needs met. Pt has had recent significant change in her capacity and independence in mobility, will need extensive rehab in the coming months. I do not see her providing caregiver services to her month any time soon, but will obtain more details at future visits.      Recommendations for follow up therapy are one component of a multi-disciplinary discharge planning process, led by the attending physician.  Recommendations may be updated based on patient status, additional functional criteria and insurance authorization.  Follow Up Recommendations Can patient physically be transported by private  vehicle: No     Assistance Recommended at Discharge Intermittent Supervision/Assistance  Patient can return home with the following  Two people to help with walking and/or transfers;Two people to help with bathing/dressing/bathroom;Help with stairs or ramp for entrance;Assist for transportation    Equipment Recommendations None recommended by PT (defer to next venue of care)  Recommendations for Other Services       Functional Status Assessment Patient has had a recent decline in their functional status and demonstrates the ability to make significant improvements in function in a reasonable and predictable amount of time.     Precautions / Restrictions Precautions Precaution Comments: hemi RUE Restrictions Weight Bearing Restrictions: No      Mobility  Bed Mobility Overal bed mobility: Needs Assistance Bed Mobility: Supine to Sit, Sit to Supine     Supine to sit: Mod assist Sit to supine: Min assist   General bed mobility comments: puts forth good effort; established balance well in short sitting at EOB.    Transfers Overall transfer level:  (unsafe to attempt given RLE hemi weakness and abnormally high ED gurney)                      Ambulation/Gait                  Stairs            Wheelchair Mobility    Modified Rankin (Stroke Patients Only)       Balance  Pertinent Vitals/Pain Pain Assessment Pain Assessment: No/denies pain    Home Living Family/patient expects to be discharged to:: Private residence Living Arrangements: Parent (maybe other relatives (expressive deificts limiting at eval)) Available Help at Discharge: Family             Home Equipment: None      Prior Function Prior Level of Function : Independent/Modified Independent                     Hand Dominance   Dominant Hand: Right    Extremity/Trunk Assessment   Upper Extremity  Assessment Upper Extremity Assessment: RUE deficits/detail RUE Deficits / Details: grossly hemiparetic RUE with some partial extension proximally, little to no gross limb flexion.; reported paresthesias upon screening    Lower Extremity Assessment Lower Extremity Assessment: Generalized weakness;RLE deficits/detail RLE Deficits / Details: weak, minA for full ROM; unable to SLR, force production very limited.       Communication   Communication: Expressive difficulties  Cognition Arousal/Alertness: Awake/alert Behavior During Therapy: WFL for tasks assessed/performed Overall Cognitive Status: Within Functional Limits for tasks assessed                                          General Comments      Exercises     Assessment/Plan    PT Assessment Patient needs continued PT services  PT Problem List Decreased strength;Decreased knowledge of use of DME;Decreased activity tolerance;Decreased balance;Decreased mobility;Decreased coordination;Decreased cognition;Impaired sensation;Decreased knowledge of precautions       PT Treatment Interventions DME instruction;Gait training;Stair training;Functional mobility training;Therapeutic activities;Therapeutic exercise;Balance training;Neuromuscular re-education;Cognitive remediation;Patient/family education    PT Goals (Current goals can be found in the Care Plan section)  Acute Rehab PT Goals PT Goal Formulation: Patient unable to participate in goal setting    Frequency Min 4X/week     Co-evaluation               AM-PAC PT "6 Clicks" Mobility  Outcome Measure Help needed turning from your back to your side while in a flat bed without using bedrails?: A Lot Help needed moving from lying on your back to sitting on the side of a flat bed without using bedrails?: A Lot Help needed moving to and from a bed to a chair (including a wheelchair)?: A Lot Help needed standing up from a chair using your arms (e.g.,  wheelchair or bedside chair)?: A Lot Help needed to walk in hospital room?: Total Help needed climbing 3-5 steps with a railing? : Total 6 Click Score: 10    End of Session   Activity Tolerance: Patient tolerated treatment well;No increased pain Patient left: in bed;with call bell/phone within reach;Other (comment) (RUE elevated, Rt heel elevated, HOB at 30degrees) Nurse Communication: Mobility status PT Visit Diagnosis: Other abnormalities of gait and mobility (R26.89);Unsteadiness on feet (R26.81);Difficulty in walking, not elsewhere classified (R26.2);Muscle weakness (generalized) (M62.81);Other symptoms and signs involving the nervous system (R29.898);Hemiplegia and hemiparesis Hemiplegia - Right/Left: Right Hemiplegia - dominant/non-dominant: Dominant Hemiplegia - caused by: Cerebral infarction    Time: 4098-1191 PT Time Calculation (min) (ACUTE ONLY): 19 min   Charges:   PT Evaluation $PT Eval Moderate Complexity: 1 Mod         10:51 AM, 07/23/22 Rosamaria Lints, PT, DPT Physical Therapist - [facility]  [phone] (ASCOM)    Elai Vanwyk C 07/23/2022,  10:48 AM

## 2022-07-23 NOTE — ED Notes (Signed)
81 RN on neuro cart with stroke RN. Jon Billings, NP at bedside as well   0110 Neurologist Patrylo on line evaluating pt Patient able to answer orientation questions of name, location, year correctly at this time. Pt unable to do this with this RN prior to this point. Pt slurred speech noted to be improved and pt more interactive with assessment, requiring less prompting. Pt remains unable to lift or move R arm. Pt can slightly lift R leg off bed but is unable to maintain it for any amount of time. Pt now noted to be crossing R leg under the left, where prior she was seemingly unable to move R leg at all spontaneously. Pt remains able to move L leg and arm with purposeful movement and following commands. RN still unable to accurately obtain sensation assessment from pt. Pt peripheral vision appears intact bilaterally as pt states she can see RN fingers wiggling in peripheral with pt looking straight ahead at RN nose. Pt able to correctly identify objects in the pictures on the stroke cards- I.e glove, cactus, key. Pt unable to describe the scene on the stroke card. Subjectively this may be attributed to pt expressive aphasia, as pt appeared to be trying to communicate accurate depiction. Pt able to say things such as "boy, hand, jar" and "mother, sink." Pt was able to correctly read 2 of the sentences on the stroke cards. Pt pupils remain 2 bilaterally, round, and reactive.   0140 RN left room. Neuro cart turned off and Neurologist Patrylo states he will review pt chart and call Jon Billings, NP with orders/recommendations.

## 2022-07-23 NOTE — ED Notes (Signed)
CODE STROKE called to [facility] (Ruby)per B Jon Billings

## 2022-07-23 NOTE — ED Notes (Signed)
Messaged provider regarding increased NIH.

## 2022-07-23 NOTE — Consult Note (Signed)
[facility] TeleNeurology Consult Services   Patient Name:   Destiny Palmer, Destiny Palmer Date of Birth:   1965/10/23 Identification Number:   MRN - [id number] Date of Service:   07/23/2022 00:54:13  Diagnosis:       I63.89 - Cerebrovascular accident (CVA) due to other mechanism [facility])  Impression:      57 yo woman currently admitted for AMS and stroke. She was seen earlier by neurology but due to continued and possibly worsening symptoms, another SA is called. It does seem like her exam is worse than when she first presented. Unfortunately we do not have a time last normal. It is not mentioned in any note that I can see and the patient is aphasic and not able to provide history. For now, continue aspirin and plavix. She does need vascular imaging, CTA vs MRA though this does not need to be done for thrombectomy decision making but rather as a typical part of a stroke workup.    Please have neurology continue to follow.  Our recommendations are outlined below.  Recommendations:        Stroke/Telemetry Floor       Neuro Checks       Bedside Swallow Eval       DVT Prophylaxis       IV Fluids, Normal Saline       Head of Bed 30 Degrees       Euglycemia and Avoid Hyperthermia (PRN Acetaminophen)       Initiate dual antiplatelet therapy with Aspirin 81 mg daily and Clopidogrel 75 mg daily.    ------------------------------------------------------------------------------  Advanced Imaging: Advanced Imaging Deferred because:  stroke already noted on CT..unclear time LKN   Metrics: Last Known Well: Unknown [facility] Notification Time: 07/23/2022 00:50:58 Stamp Time: 07/23/2022 00:54:13 Initial Response Time: 07/23/2022 00:58:40 Symptoms: aphasia, weakness. Initial patient interaction: 07/23/2022 01:10:01 NIHSS Assessment Completed: 07/23/2022 01:17:26 Patient is not a candidate for Thrombolytic. Thrombolytic Medical Decision: 07/23/2022 01:17:28 Patient was not deemed candidate  for Thrombolytic because of following reasons: Last Well Known Above 4.5 Hours.  I personally Reviewed the CT Head  Primary Provider Notified of Diagnostic Impression and Management Plan on: 07/23/2022 01:50:16 Spoke With: Manuela Schwartz Able to Reach 07/23/2022 01:50:16    ------------------------------------------------------------------------------  History of Present Illness: Patient is a 57 year old Female.  Inpatient stroke alert was called for symptoms of aphasia, weakness. This is a 57 yo woman who presented around noon yesterday for AMS, slurred speech, and difficulty walking. Unclear time LKN. In the course of her workup she had a head CT which showed a left occipital hypodensity so a brain MRI was ordered. This showed a left occipital stroke as well as temporal lobe and thalamus. A stroke alert was called and she was seen by my colleague who did not recommend thrombolytics or need for CTA due to unclear time LKN. Unfortuanately due to IT problems he has not been able to get his note in the chart.  Tonight, symptoms were noted by new staff and a stroke alert was called. It does seem like her exam has worsened compared to the ER note.   Past Medical History:      Hypertension      Diabetes Mellitus Othere PMH:  psychiatric disease  Medications:  No Anticoagulant use  Antiplatelet use: Yes aspirin plavix Reviewed EMR for current medications Other Medications Pertinent To Assessment Include: prozac, neurontin, norco, atarax, risperdal, trazodone  Allergies:  Reviewed  Social History: Unable To Obtain Due To Patient Status :  Patient Cannot Speak  Family History:  There is no family history of premature cerebrovascular disease pertinent to this consultation  ROS : ROS Cannot Be Obtained Because:  Patient Cannot Speak  Past Surgical History: Past Surgical History Cannot Be Obtained Because: Patient Cannot Speak    Examination: BP(144/83), Pulse(61), 1A:  Level of Consciousness - Arouses to minor stimulation + 1 1B: Ask Month and Age - Both Questions Right + 0 1C: Blink Eyes & Squeeze Hands - Performs Both Tasks + 0 2: Test Horizontal Extraocular Movements - Normal + 0 3: Test Visual Fields - No Visual Loss + 0 4: Test Facial Palsy (Use Grimace if Obtunded) - Minor paralysis (flat nasolabial fold, smile asymmetry) + 1 5A: Test Left Arm Motor Drift - No Drift for 10 Seconds + 0 5B: Test Right Arm Motor Drift - No Effort Against Gravity + 3 6A: Test Left Leg Motor Drift - No Drift for 5 Seconds + 0 6B: Test Right Leg Motor Drift - Drift, hits bed + 2 7: Test Limb Ataxia (FNF/Heel-Shin) - No Ataxia + 0 8: Test Sensation - Normal; No sensory loss + 0 9: Test Language/Aphasia - Severe Aphasia: Fragmentary Expression, Inference Needed, Cannot Identify Materials + 2 10: Test Dysarthria - Mild-Moderate Dysarthria: Slurring but can be understood + 1 11: Test Extinction/Inattention - No abnormality + 0  NIHSS Score: 10   Pre-Morbid Modified Rankin Scale: Unable to assess  Spoke with : Manuela Schwartz  Patient/Family was informed the Neurology Consult would occur via TeleHealth consult by way of interactive audio and video telecommunications and consented to receiving care in this manner.   Patient is being evaluated for possible acute neurologic impairment and high probability of imminent or life-threatening deterioration. I spent total of 45 minutes providing care to this patient, including time for face to face visit via telemedicine, review of medical records, imaging studies and discussion of findings with providers, the patient and/or family.   Dr Parthenia Ames   [facility] For Inpatient follow-up with [facility] physician please call [facility] [phone]. This is not an outpatient service. Post hospital discharge, please contact hospital directly.  Please do not communicate with [facility] physicians via secure chat. If  you have any questions, Please contact [facility]. Please call or reconsult our service if there are any clinical or diagnostic changes.

## 2022-07-23 NOTE — ED Notes (Addendum)
Provider, Cliffton Asters responded to RN page at [phone]  0018 Code Stroke Called by Jon Billings, NP d/t new neuro deficits- R droop, aphasia, inability to follow commands, continued R weakness  0019 button pushed on Neuro Cart  0020 RN on line  0021 Neurologist on line on cart in room   0025 Neuro states no new orders from them. Not new code stroke. + neuro change.   9604 RN and NP accompany patient to CT for repeat scan  0032 Returned pt to room. Full cardiac monitor in place. Neuro team on cart state to push button in room when CT results are in

## 2022-07-23 NOTE — ED Notes (Signed)
Diabetes coordinator at bedside speaking with patient  

## 2022-07-23 NOTE — ED Notes (Signed)
Patient is resting comfortably. Equal chest rise and fall observed, vital signs stable, and patient remains on cardiac monitor. Appears in NAD at this time. Awaiting inpatient room at this time.

## 2022-07-23 NOTE — ED Notes (Signed)
RN went to introduce self to pt and assess. Pt was sleeping soundly and required loud repetitive stimulation to wake. Pt had difficulty answering questions or following commands. However, pt noted to be drifting back off to sleep while RN was speaking with and attempting to engage pt. Difficult to assess true orientation and neuro function. Pt did deny headache. Pt pupils noted to be equal and reactive. NIH charted.

## 2022-07-23 NOTE — ED Notes (Signed)
Patient is resting comfortably. Appears in NAD at this time. Responds to voice and is oriented x 4 with delayed responses. Meal tray at bedside and set up for patient.

## 2022-07-23 NOTE — ED Notes (Signed)
Patient to CT at this time

## 2022-07-23 NOTE — ED Notes (Signed)
Breakfast tray at bedside 

## 2022-07-23 NOTE — Progress Notes (Addendum)
Second code stroke activated at 0018, Destiny Schwartz, NP at bedside. ROS 2345 when new RN took over care and performed her initial assessment. To CT at 0025. Returned to room at 0033.  Symptoms described are the same as presenting symptoms from initial Code stroke at 2122.[name]  Reached out to Dr. Gerre Pebbles regarding concerns and he recommended to page out another neurologist as a new code stroke. LKW remains unknown.  Dr. Felix Ahmadi connected at 0050.  No new orders, no TNK, no CTA/CTP due to LKW remaining unknown.  Dr. Felix Ahmadi to contact Jon Billings, NP to discuss his recommendations.

## 2022-07-23 NOTE — ED Notes (Signed)
Pt returned from CT °

## 2022-07-23 NOTE — Progress Notes (Signed)
PROGRESS NOTE    Destiny Palmer  WUJ:[id number] DOB: 28-Jul-1965 DOA: 07/22/2022 PCP: [facility]    Brief Narrative:   57 y.o. female with medical history significant of diabetes mellitus reportedly nonadherent to diabetic regimen who presents to the ED with chief complaint of altered mentation.  Patient has been lethargic with nausea and vomiting.  Remains alert and oriented x 3.  Apparently CBG was over 600 with EMS.  310 on BMP on arrival.   Apparently had some slurred speech and difficulty walking.  Patient unable to provide much history.  On my evaluation patient is resting comfortably in bed.  She is alert and oriented x 3.  She is a little confused on the location, states we are in [place].  Does have a psychiatric history including adjustment disorder, history of suicide attempt in 2017, PTSD.  Psychiatric regimen is listed on her home medication reconciliation but there are no documented outpatient visits for several years so unclear exactly what she has been taken.   Urinalysis shows significant glucosuria and some evidence of leukocytes.  No fever.  CT head demonstrates evidence of old CVA versus less likely space-occupying lesion.   Laboratory investigation overall unrevealing apart from some hyperglycemia.  Remainder of CMP within normal limits.  Urine drug screen negative.  Chest x-ray negative  5/3: Stroke alert called last night for acute right lower extremity and right upper extremity weakness.  MRI that was ordered by myself did demonstrate large multifocal acute infarct in PCA territory.  Teleneurology consulted.  Started on aspirin Plavix dual antiplatelet therapy.   Assessment & Plan:   Principal Problem:   Acute metabolic encephalopathy Active Problems:   Acute CVA (cerebrovascular accident) (HCC)  Acute CVA Acute metabolic encephalopathy And now appears that patient's altered mentation was secondary to evolving infarct.  Unknown last known well.  Unable  to reach family. Plan: Check CTA head and neck Continue DAPT Continue intensity statin Telemetry monitoring Neurology consult Permissive hypertension  Insulin-dependent diabetes mellitus with hyperglycemia Hemoglobin A1c 14.5.  Patient not adherent to prescribed insulin regimen. Plan: Semglee 15 units nightly Moderate sliding scale Nightly coverage Carb modified diet Diabetes coordinator consult  Essential hypertension Allow for permissive hypertension.  As needed hydralazine for SBP greater than 220  Hyperlipidemia Lipitor 40 mg daily  Depression Psychiatric disorder not otherwise specified PTA Prozac As needed Atarax Nightly respite oh    DVT prophylaxis: SQ Lovenox Code Status: Full Family Communication: None Disposition Plan: Status is: Inpatient Remains inpatient appropriate because: Acute CVA.  Workup in progress   Level of care: Med-Surg  Consultants:  Neurology  Procedures:  None  Antimicrobials: Rocephin    Subjective: Seen and examined.  Significant right upper and lower extremity weakness noted.  Mental status appears improved this morning.  Objective: Vitals:   07/23/22 0830 07/23/22 1145 07/23/22 1200 07/23/22 1300  BP: (!) 142/96  (!) 148/82 130/75  Pulse:  80 72 71  Resp: 11 17 17 15   Temp:    98 F (36.7 C)  TempSrc:    Oral  SpO2:  99% 99% 99%  Weight:      Height:        Intake/Output Summary (Last 24 hours) at 07/23/2022 1354 Last data filed at 07/22/2022 1905 Gross per 24 hour  Intake 2100 ml  Output --  Net 2100 ml   Filed Weights   07/22/22 1700  Weight: 68.9 kg    Examination:  General exam: NAD. Respiratory system: Lungs clear.  Normal breathing.  Room air Cardiovascular system: S1-S2, RRR, no murmurs, no pedal edema Gastrointestinal system: Soft, NT/ND, normal bowel sounds Central nervous system: Alert and oriented.  RUE/RLE marked deficit Extremities: Decreased power right upper and lower extremities Skin:  No rashes, lesions or ulcers Psychiatry: Judgement and insight appear impaired. Mood & affect flattened.     Data Reviewed: I have personally reviewed following labs and imaging studies  CBC: Recent Labs  Lab 07/22/22 1309 07/23/22 0411  WBC 6.9 6.3  NEUTROABS 4.6  --   HGB 17.1* 13.1  HCT 51.0* 39.7  MCV 90.3 90.8  PLT 269 190   Basic Metabolic Panel: Recent Labs  Lab 07/22/22 1548 07/23/22 0411  NA 137 139  K 5.1 3.6  CL 101 102  CO2 27 29  GLUCOSE 310* 244*  BUN 15 15  CREATININE 0.65 0.62  CALCIUM 8.9 8.5*  MG 1.9 1.8  PHOS 3.3 3.0   GFR: Estimated Creatinine Clearance: 72.6 mL/min (by C-G formula based on SCr of 0.62 mg/dL). Liver Function Tests: Recent Labs  Lab 07/22/22 1548 07/23/22 0411  AST 33 23  ALT 22 23  ALKPHOS 84 71  BILITOT 1.5* 0.8  PROT 6.8 6.1*  ALBUMIN 3.2* 2.9*   No results for input(s): "LIPASE", "AMYLASE" in the last 168 hours. No results for input(s): "AMMONIA" in the last 168 hours. Coagulation Profile: No results for input(s): "INR", "PROTIME" in the last 168 hours. Cardiac Enzymes: No results for input(s): "CKTOTAL", "CKMB", "CKMBINDEX", "TROPONINI" in the last 168 hours. BNP (last 3 results) No results for input(s): "PROBNP" in the last 8760 hours. HbA1C: Recent Labs    07/22/22 1309  HGBA1C 14.5*   CBG: Recent Labs  Lab 07/22/22 2127 07/22/22 2344 07/23/22 0017 07/23/22 0813 07/23/22 1137  GLUCAP 264* 233* 258* 212* 168*   Lipid Profile: Recent Labs    07/23/22 0411  CHOL 224*  HDL 30*  LDLCALC 158*  TRIG 181*  CHOLHDL 7.5   Thyroid Function Tests: No results for input(s): "TSH", "T4TOTAL", "FREET4", "T3FREE", "THYROIDAB" in the last 72 hours. Anemia Panel: No results for input(s): "VITAMINB12", "FOLATE", "FERRITIN", "TIBC", "IRON", "RETICCTPCT" in the last 72 hours. Sepsis Labs: Recent Labs  Lab 07/22/22 1755  LATICACIDVEN 1.4    Recent Results (from the past 240 hour(s))  Blood culture  (routine x 2)     Status: None (Preliminary result)   Collection Time: 07/22/22  5:48 PM   Specimen: BLOOD  Result Value Ref Range Status   Specimen Description BLOOD RIGHT ANTECUBITAL  Final   Special Requests   Final    BOTTLES DRAWN AEROBIC AND ANAEROBIC Blood Culture adequate volume   Culture   Final    NO GROWTH < 12 HOURS Performed at [facility], [address] [place], [place] [address]    Report Status PENDING  Incomplete  Blood culture (routine x 2)     Status: None (Preliminary result)   Collection Time: 07/22/22  5:55 PM   Specimen: BLOOD  Result Value Ref Range Status   Specimen Description BLOOD LEFT ANTECUBITAL  Final   Special Requests   Final    BOTTLES DRAWN AEROBIC AND ANAEROBIC Blood Culture adequate volume   Culture   Final    NO GROWTH < 12 HOURS Performed at [facility], [address], [place], [place] [address]    Report Status PENDING  Incomplete         Radiology Studies: CT ANGIO HEAD NECK W WO  CM  Result Date: 07/23/2022 CLINICAL DATA:  Stroke, follow up EXAM: CT ANGIOGRAPHY HEAD AND NECK WITH AND WITHOUT CONTRAST TECHNIQUE: Multidetector CT imaging of the head and neck was performed using the standard protocol during bolus administration of intravenous contrast. Multiplanar CT image reconstructions and MIPs were obtained to evaluate the vascular anatomy. Carotid stenosis measurements (when applicable) are obtained utilizing NASCET criteria, using the distal internal carotid diameter as the denominator. RADIATION DOSE REDUCTION: This exam was performed according to the departmental dose-optimization program which includes automated exposure control, adjustment of the mA and/or kV according to patient size and/or use of iterative reconstruction technique. CONTRAST:  75mL OMNIPAQUE IOHEXOL 350 MG/ML SOLN COMPARISON:  MRI Jul 22, 2022. FINDINGS: CT HEAD FINDINGS Brain: Similar appearance of multifocal evolving infarcts in the left PCA  distribution, better characterized on prior MRI. No evidence of progressive mass effect or mass occupying acute hemorrhage. No midline shift. No hydrocephalus. Vascular: See below. Skull: No acute fracture. Sinuses/Orbits: Paranasal sinus mucosal thickening. No acute orbital findings. Review of the MIP images confirms the above findings CTA NECK FINDINGS Aortic arch: Great vessel origins are patent without significant stenosis. Right carotid system: Approximately 70% stenosis of the right ICA origin. Left carotid system: Atherosclerosis at the carotid bifurcation without greater than 50% stenosis. Vertebral arteries: Left dominant. Severe stenosis of the proximal right non dominant/small vertebral artery. Left vertebral artery is patent. Skeleton: No acute abnormality on limited assessment. Other neck: Negative. Upper chest: Visualized lung apices are clear. Review of the MIP images confirms the above findings CTA HEAD FINDINGS Anterior circulation: Severe proximal left cavernous ICA stenosis. Severe bilateral paraclinoid ICA stenosis bilaterally. Bilateral MCAs are patent without proximal 8 image late significant stenosis. Hypoplastic left ACA. Right A1 ACA and bilateral A2 ACAs are patent without proximal hemodynamically significant stenosis. 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 Severe/critical stenosis versus occlusion of the proximal left PCA with poor/irregular distal opacification. Venous sinuses: As permitted by contrast timing, patent. Review of the MIP images confirms the above findings IMPRESSION: 1. Severe/critical stenosis versus occlusion of the proximal left PCA with poor/irregular distal opacification. 2. Severe bilateral intracranial ICA stenosis. 3. Approximately 70% stenosis of the right ICA origin in the neck. 4. Severe stenosis of the small/non dominant right vertebral artery origin. The small/non dominant right intradural  vertebral artery is occluded distally. Electronically Signed   By: Feliberto Harts M.D.   On: 07/23/2022 09:19   CT HEAD WO CONTRAST ( )  Result Date: 07/23/2022 CLINICAL DATA:  Stroke, worsening neurologic examination EXAM: CT HEAD WITHOUT CONTRAST TECHNIQUE: Contiguous axial images were obtained from the base of the skull through the vertex without intravenous contrast. RADIATION DOSE REDUCTION: This exam was performed according to the departmental dose-optimization program which includes automated exposure control, adjustment of the mA and/or kV according to patient size and/or use of iterative reconstruction technique. COMPARISON:  07/22/2022 FINDINGS: Brain: Stable multifocal subacute infarct within the left occipital lobe, thalamus, genu of the internal capsule, and cerebral peduncle. 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 No superimposed acute intracranial hemorrhage. No new abnormal mass effect or midline shift. Ventricular size is normal. Cerebellum is unremarkable. Vascular: No hyperdense vessel or unexpected calcification. Skull: Normal. Negative for fracture or focal lesion. Sinuses/Orbits: Paranasal sinuses are clear. Orbits are unremarkable. Other: Mastoid air cells and middle ear cavities are clear. IMPRESSION: 1. Stable multifocal subacute infarcts within the left occipital lobe, thalamus, genu of the internal capsule, and  cerebral peduncle. No superimposed acute intracranial hemorrhage. No new abnormal mass effect or midline shift. Electronically Signed   By: Helyn Numbers M.D.   On: 07/23/2022 00:51   MR BRAIN WO CONTRAST  Result Date: 07/22/2022 CLINICAL DATA:  Altered mental status EXAM: MRI HEAD WITHOUT CONTRAST TECHNIQUE: Multiplanar, multiecho pulse sequences of the brain and surrounding structures were obtained without intravenous contrast. COMPARISON:  None Available. FINDINGS: Brain: Multifocal acute ischemia within the left PCA territory, including the  occipital lobe, temporal lobe and thalamus. No acute or chronic hemorrhage. There is multifocal hyperintense T2-weighted signal within the white matter. Parenchymal volume and CSF spaces are normal. The midline structures are normal. Vascular: Major flow voids are preserved. Skull and upper cervical spine: Normal calvarium and skull base. Visualized upper cervical spine and soft tissues are normal. Sinuses/Orbits:No paranasal sinus fluid levels or advanced mucosal thickening. No mastoid or middle ear effusion. Normal orbits. IMPRESSION: Multifocal acute ischemia within the left PCA territory, including the occipital lobe, temporal lobe and thalamus. No hemorrhage or mass effect. Electronically Signed   By: Deatra Robinson M.D.   On: 07/22/2022 20:41   CT HEAD WO CONTRAST ( )  Result Date: 07/22/2022 CLINICAL DATA:  Altered mental status, lethargy EXAM: CT HEAD WITHOUT CONTRAST TECHNIQUE: Contiguous axial images were obtained from the base of the skull through the vertex without intravenous contrast. RADIATION DOSE REDUCTION: This exam was performed according to the departmental dose-optimization program which includes automated exposure control, adjustment of the mA and/or kV according to patient size and/or use of iterative reconstruction technique. COMPARISON:  None Available. FINDINGS: Brain: There are no signs of bleeding within the cranium. There is a 2.6 cm area of low-density in posterior left occipital lobe. There is subcentimeter low-density in the left basal ganglia. Cortical sulci are prominent. Vascular: Scattered arterial calcifications are seen. Skull: Unremarkable. Sinuses/Orbits: There is mucosal thickening in ethmoid sinus. Other: None. IMPRESSION: There are no signs of bleeding within the cranium. There is no focal mass effect. There is 2.6 cm low-density in the posterior left occipital lobe suggesting possible encephalomalacia from previous infarction. Less likely possibility would be  space-occupying lesion. Follow-up MRI may be considered. The subcentimeter low-density in the left basal ganglia suggesting possible old lacunar infarct. Atrophy. Electronically Signed   By: Ernie Avena M.D.   On: 07/22/2022 14:02   DG Chest Portable 1 View  Result Date: 07/22/2022 CLINICAL DATA:  Shortness of breath EXAM: PORTABLE CHEST 1 VIEW COMPARISON:  10/21/2015 FINDINGS: Cardiac size is within normal limits. There is poor inspiration. There are no signs of pulmonary edema or focal pulmonary consolidation. There is no pleural effusion or pneumothorax. IMPRESSION: There are no signs of pulmonary edema or focal pulmonary consolidation. Electronically Signed   By: Ernie Avena M.D.   On: 07/22/2022 13:56        Scheduled Meds:   stroke: early stages of recovery book   Does not apply Once   aspirin EC  81 mg Oral Daily   atorvastatin  40 mg Oral Daily   clopidogrel  75 mg Oral Daily   enoxaparin (LOVENOX) injection  40 mg Subcutaneous Q24H   FLUoxetine  40 mg Oral Daily   insulin aspart  0-15 Units Subcutaneous TID WC   insulin aspart  0-5 Units Subcutaneous QHS   insulin glargine-yfgn  15 Units Subcutaneous QHS   Continuous Infusions:  lactated ringers 75 mL/hr at 07/22/22 1820     LOS: 0 days  Tresa Moore, MD [facility]   If 7PM-7AM, please contact night-coverage  07/23/2022, 1:54 PM

## 2022-07-23 NOTE — Evaluation (Signed)
Occupational Therapy Evaluation Patient Details Name: Destiny Palmer MRN: [id number] DOB: [birth year] Today's Date: 07/23/2022   History of Present Illness Destiny Palmer is a 56yoF who comes to [facility] on 5/2 with AMS, BG in 600s on arrival. Code stoke called later in evening after acute onset motor deficits RUE. PMH: DM, HTN, SI, Lt shoulder dislocation s/p closed reduction 2018. MRI brain revealing "multifocal acute ischemia within the left PCA territory, including the occipital lobe, temporal lobe and thalamus."  At baseline pt denies any impairment of gait or balance, not use of AD, reports to live with mother whom she provides caregiver assistance.   Clinical Impression   Destiny Palmer was seen for OT evaluation this date. Prior to hospital admission, pt was IND. Pt lives with mother. History limited by expressive aphasia. Pt presents to acute OT demonstrating impaired ADL performance and functional mobility 2/2 limited use of dominant RUE and functional strength/ROM/balance deficits. Pt currently requires SETUP self-feeding using non-dominant LUE. Fair static sitting balance, initial assist to correct R lateral lean. Anticipate MOD A for UB dressing in sitting. MAX A lateral scoot along EOB. Educated on RUE positioning. Pt would benefit from skilled OT to address noted impairments and functional limitations (see below for any additional details). Upon hospital discharge, recommend follow up therapy >3 hours/day.   Recommendations for follow up therapy are one component of a multi-disciplinary discharge planning process, led by the attending physician.  Recommendations may be updated based on patient status, additional functional criteria and insurance authorization.   Assistance Recommended at Discharge Frequent or constant Supervision/Assistance  Patient can return home with the following Two people to help with walking and/or transfers;Two people to help with bathing/dressing/bathroom     Functional Status Assessment  Patient has had a recent decline in their functional status and demonstrates the ability to make significant improvements in function in a reasonable and predictable amount of time.  Equipment Recommendations  Other (comment) (defer)    Recommendations for Other Services       Precautions / Restrictions Precautions Precautions: Fall Precaution Comments: hemi RUE Restrictions Weight Bearing Restrictions: No      Mobility Bed Mobility Overal bed mobility: Needs Assistance Bed Mobility: Supine to Sit, Sit to Supine     Supine to sit: Mod assist Sit to supine: Min assist        Transfers Overall transfer level: Needs assistance   Transfers: Bed to chair/wheelchair/BSC            Lateral/Scoot Transfers: Max assist General transfer comment: at elevated stretcher height      Balance Overall balance assessment: Needs assistance Sitting-balance support: Single extremity supported, Feet unsupported Sitting balance-Leahy Scale: Fair                                     ADL either performed or assessed with clinical judgement   ADL Overall ADL's : Needs assistance/impaired                                       General ADL Comments: SETUP self-feeding using non-dominant LUE. Anticipate MOD A for UB dressing in sitting. MAX A for simulated BSC t/f      Pertinent Vitals/Pain Pain Assessment Pain Assessment: No/denies pain     Hand Dominance Right   Extremity/Trunk Assessment Upper Extremity Assessment  Upper Extremity Assessment: RUE deficits/detail RUE Deficits / Details: 1/5 grossly   Lower Extremity Assessment Lower Extremity Assessment: Defer to PT evaluation RLE Deficits / Details: weak, minA for full ROM; unable to SLR, force production very limited.       Communication Communication Communication: Expressive difficulties   Cognition Arousal/Alertness: Awake/alert Behavior During  Therapy: WFL for tasks assessed/performed Overall Cognitive Status: Difficult to assess                                 General Comments: follows 1 step commands                Home Living Family/patient expects to be discharged to:: Private residence Living Arrangements: Parent Available Help at Discharge: Family                         Home Equipment: None   Additional Comments: limited by aphasia      Prior Functioning/Environment Prior Level of Function : Independent/Modified Independent               ADLs Comments: caregiver for mother (assist with IADLs)        OT Problem List: Decreased strength;Impaired balance (sitting and/or standing);Decreased range of motion;Decreased activity tolerance;Decreased safety awareness      OT Treatment/Interventions: Self-care/ADL training;Therapeutic exercise;Energy conservation;DME and/or AE instruction;Neuromuscular education;Therapeutic activities;Balance training;Patient/family education    OT Goals(Current goals can be found in the care plan section) Acute Rehab OT Goals Patient Stated Goal: to return to PLOF OT Goal Formulation: With patient Time For Goal Achievement: 08/06/22 Potential to Achieve Goals: Good ADL Goals Pt Will Perform Grooming: with min assist;sitting Pt Will Perform Upper Body Dressing: with supervision;sitting Pt Will Transfer to Toilet: with min assist;stand pivot transfer;bedside commode Pt/caregiver will Perform Home Exercise Program: Increased strength;Right Upper extremity;Independently  OT Frequency: Min 3X/week    Co-evaluation              AM-PAC OT "6 Clicks" Daily Activity     Outcome Measure Help from another person eating meals?: A Little Help from another person taking care of personal grooming?: A Lot Help from another person toileting, which includes using toliet, bedpan, or urinal?: A Lot Help from another person bathing (including washing,  rinsing, drying)?: A Lot Help from another person to put on and taking off regular upper body clothing?: A Lot Help from another person to put on and taking off regular lower body clothing?: A Lot 6 Click Score: 13   End of Session    Activity Tolerance: Patient tolerated treatment well Patient left: in bed;with call bell/phone within reach  OT Visit Diagnosis: Other abnormalities of gait and mobility (R26.89);Muscle weakness (generalized) (M62.81)                Time: 1610-9604 OT Time Calculation (min): 17 min Charges:  OT General Charges $OT Visit: 1 Visit OT Evaluation $OT Eval Moderate Complexity: 1 Mod  Kathie Dike, M.S. OTR/L  07/23/22, 2:16 PM  ascom [phone]

## 2022-07-23 NOTE — ED Notes (Signed)
Repeated swallow screen r/t new found aphasia and right sided facial droop. Pt failed swallow screen. MD notified.

## 2022-07-23 NOTE — Progress Notes (Signed)
*  PRELIMINARY RESULTS* Echocardiogram 2D Echocardiogram has been performed.  Destiny Palmer 07/23/2022, 8:07 AM

## 2022-07-23 NOTE — Progress Notes (Signed)
SLP Cancellation Note  Patient Details Name: Destiny Palmer MRN: [id number] DOB: Jun 12, 1965   Cancelled treatment:       Reason Eval/Treat Not Completed: Patient at procedure or test/unavailable (chart reviewed) Will f/u w/ pt's cognitive-linguistic assessment tomorrow.     Jerilynn Som, MS, CCC-SLP Speech Language Pathologist Rehab Services; [facility] [phone] (ascom) Shiron Whetsel 07/23/2022, 5:07 PM

## 2022-07-23 NOTE — Consult Note (Signed)
NEURO HOSPITALIST CONSULT NOTE   Requestig physician: Dr. Georgeann Oppenheim  Reason for Consult: Acute left PCA territory ischemic infarctions  History obtained from:  Patient and Chart     HPI:                                                                                                                                          Destiny Palmer is an 57 y.o. female with a PMHx of DM and HTN who presented to the ED yesterday with lethargy, nausea and vomiting. Her CBG was over 600 with EMS. Family noted her to have some slurred speech and difficulty walking. She had recently stopped taking her metformin. She did not fit criteria for DKA but HHS was on the DDx. She was treated with IVF and glucose trended downward. UDS was negative. U/A showed significant glucosuria.   CT head revealed multifocal subacute infarcts within the left occipital lobe, thalamus, genu of the internal capsule, and cerebral peduncle.   MRI was then obtained, confirming multiple subacute ischemic infarctions in the left PCA territory.   About 1 hour after completion of MRI, she exhibited neurological worsening and a Code Stroke was called. Teleneurology evaluated the patient with exam revealing an NIHSS of 10, which was worse than her exams documented earlier. As there was no clear LKN, she was not a candidate for IV thrombolysis or mechanical thrombectomy. DAPT was initiated along with IVF.      Past Medical History:  Diagnosis Date   Diabetes mellitus without complication (HCC)    Hypertension     Past Surgical History:  Procedure Laterality Date   NO PAST SURGERIES     SHOULDER CLOSED REDUCTION Left 03/30/2016   Procedure: CLOSED REDUCTION SHOULDER;  Surgeon: Donato Heinz, MD;  Location: [facility] ORS;  Service: Orthopedics;  Laterality: Left;    Family History  Problem Relation Age of Onset   COPD Mother              Social History:  reports that she has never smoked. She has never used smokeless  tobacco. She reports current alcohol use. She reports that she does not use drugs.  Allergies  Allergen Reactions   Nortriptyline Other (See Comments)    Other Reaction(s): Too Sedating    MEDICATIONS:  Prior to Admission:  Medications Prior to Admission  Medication Sig Dispense Refill Last Dose   FLUoxetine (PROZAC) 40 MG capsule Take 1 capsule (40 mg total) by mouth daily. 30 capsule 0 07/21/2022   gabapentin (NEURONTIN) 600 MG tablet Take 600 mg by mouth 3 (three) times daily.   unknown   ibuprofen (ADVIL) 600 MG tablet Take 600 mg by mouth every 6 (six) hours as needed for mild pain.   07/21/2022   lidocaine (LIDODERM) 5 % Place 1 patch onto the skin daily.   unknown   naloxone (NARCAN) nasal spray 4 mg/0.1 mL Place 1 spray into the nose once.   unknown   traZODone (DESYREL) 50 MG tablet Take 50 mg by mouth at bedtime as needed for sleep.   07/21/2022   HYDROcodone-acetaminophen (NORCO) 5-325 MG tablet Take 1-2 tablets by mouth every 4 (four) hours as needed for moderate pain. (Patient not taking: Reported on 07/22/2022) 40 tablet 0 Not Taking   hydrOXYzine (ATARAX/VISTARIL) 25 MG tablet Take 1 tablet (25 mg total) by mouth 3 (three) times daily as needed for anxiety. (Patient not taking: Reported on 07/22/2022) 90 tablet 0 Not Taking   insulin aspart (NOVOLOG) 100 UNIT/ML injection Inject 3 Units into the skin 3 (three) times daily with meals. (Patient not taking: Reported on 07/22/2022) 10 mL 11 Not Taking   insulin aspart (NOVOLOG) 100 UNIT/ML injection Inject 0-15 Units into the skin 3 (three) times daily with meals. (Patient not taking: Reported on 07/22/2022) 10 mL 11 Not Taking   insulin glargine (LANTUS) 100 UNIT/ML injection Inject 0.26 mLs (26 Units total) into the skin daily. (Patient not taking: Reported on 07/22/2022) 10 mL 11 Not Taking   risperiDONE (RISPERDAL) 2 MG tablet Take  1 tablet (2 mg total) by mouth at bedtime. (Patient not taking: Reported on 07/22/2022) 30 tablet 0 Not Taking   Scheduled:   stroke: early stages of recovery book   Does not apply Once   aspirin EC  81 mg Oral Daily   atorvastatin  40 mg Oral Daily   clopidogrel  75 mg Oral Daily   enoxaparin (LOVENOX) injection  40 mg Subcutaneous Q24H   FLUoxetine  40 mg Oral Daily   insulin aspart  0-15 Units Subcutaneous TID WC   insulin aspart  0-5 Units Subcutaneous QHS   insulin glargine-yfgn  15 Units Subcutaneous QHS   Continuous:  sodium chloride       ROS:                                                                                                                                       As per HPI. Unable to obtain a detailed ROS due to poverty of speech.    Blood pressure 124/70, pulse 61, temperature 98.3 F (36.8 C), temperature source Oral, resp. rate (!) 24, height 5\' 6"  (1.676 m), weight 68.9 kg, SpO2 99 %.  General Examination:                                                                                                       Physical Exam  HEENT-  /AT   Lungs- Respirations unlabored Abdomen- Nondistended Extremities- No edema   Neurological Examination Mental Status: Awake and alert. Oriented to the city and the state, "Friday or Saturday", and the month, but not the year. Speech is moderately dysarthric. Poverty of speech noted but no definite loss of fluency. Naming and comprehension intact.  Cranial Nerves: II: Visual fields in all 4 quadrants of each eye tested individually. No extinction to DSS. PERRL.  III,IV, VI: EOMI. No nystagmus.  V: Decreased sensation to temp on the right.  VII: Prominent right facial droop.  VIII: Hearing intact to voice IX,X: Mildly hypophonic speech XI: Head is midline XII: Midline tongue extension Motor: RUE 1/5 with flaccid tone RLE 2/5 with decreased tone LUE and LLE 5/5 Sensory: Temp and light touch subjectively intact and  symmetric in all 4 extremities.  Deep Tendon Reflexes: 1+ and symmetric throughout, except for 0 bilateral achilles.  Cerebellar: No ataxia on the left. Unable to assess on the right Gait: Unable to assess   Lab Results: Basic Metabolic Panel: Recent Labs  Lab 07/22/22 1548 07/23/22 0411  NA 137 139  K 5.1 3.6  CL 101 102  CO2 27 29  GLUCOSE 310* 244*  BUN 15 15  CREATININE 0.65 0.62  CALCIUM 8.9 8.5*  MG 1.9 1.8  PHOS 3.3 3.0    CBC: Recent Labs  Lab 07/22/22 1309 07/23/22 0411  WBC 6.9 6.3  NEUTROABS 4.6  --   HGB 17.1* 13.1  HCT 51.0* 39.7  MCV 90.3 90.8  PLT 269 190    Cardiac Enzymes: No results for input(s): "CKTOTAL", "CKMB", "CKMBINDEX", "TROPONINI" in the last 168 hours.  Lipid Panel: Recent Labs  Lab 07/23/22 0411  CHOL 224*  TRIG 181*  HDL 30*  CHOLHDL 7.5  VLDL 36  LDLCALC 440*    Imaging: CT HEAD WO CONTRAST ( )  Result Date: 07/23/2022 CLINICAL DATA:  Stroke, worsening neurologic examination EXAM: CT HEAD WITHOUT CONTRAST TECHNIQUE: Contiguous axial images were obtained from the base of the skull through the vertex without intravenous contrast. RADIATION DOSE REDUCTION: This exam was performed according to the departmental dose-optimization program which includes automated exposure control, adjustment of the mA and/or kV according to patient size and/or use of iterative reconstruction technique. COMPARISON:  07/22/2022 FINDINGS: Brain: Stable multifocal subacute infarct within the left occipital lobe, thalamus, genu of the internal capsule, and cerebral peduncle. 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 No superimposed acute intracranial hemorrhage. No new abnormal mass effect or midline shift. Ventricular size is normal. Cerebellum is unremarkable. Vascular: No hyperdense vessel or unexpected calcification. Skull: Normal. Negative for fracture or focal lesion. Sinuses/Orbits: Paranasal sinuses are clear. Orbits are  unremarkable. Other: Mastoid air cells and middle ear cavities are clear. IMPRESSION: 1. Stable multifocal subacute infarcts within the left occipital lobe, thalamus, genu of the internal capsule, and  cerebral peduncle. No superimposed acute intracranial hemorrhage. No new abnormal mass effect or midline shift. Electronically Signed   By: Helyn Numbers M.D.   On: 07/23/2022 00:51   MR BRAIN WO CONTRAST  Result Date: 07/22/2022 CLINICAL DATA:  Altered mental status EXAM: MRI HEAD WITHOUT CONTRAST TECHNIQUE: Multiplanar, multiecho pulse sequences of the brain and surrounding structures were obtained without intravenous contrast. COMPARISON:  None Available. FINDINGS: Brain: Multifocal acute ischemia within the left PCA territory, including the occipital lobe, temporal lobe and thalamus. No acute or chronic hemorrhage. There is multifocal hyperintense T2-weighted signal within the white matter. Parenchymal volume and CSF spaces are normal. The midline structures are normal. Vascular: Major flow voids are preserved. Skull and upper cervical spine: Normal calvarium and skull base. Visualized upper cervical spine and soft tissues are normal. Sinuses/Orbits:No paranasal sinus fluid levels or advanced mucosal thickening. No mastoid or middle ear effusion. Normal orbits. IMPRESSION: Multifocal acute ischemia within the left PCA territory, including the occipital lobe, temporal lobe and thalamus. No hemorrhage or mass effect. Electronically Signed   By: Deatra Robinson M.D.   On: 07/22/2022 20:41   CT HEAD WO CONTRAST ( )  Result Date: 07/22/2022 CLINICAL DATA:  Altered mental status, lethargy EXAM: CT HEAD WITHOUT CONTRAST TECHNIQUE: Contiguous axial images were obtained from the base of the skull through the vertex without intravenous contrast. RADIATION DOSE REDUCTION: This exam was performed according to the departmental dose-optimization program which includes automated exposure control, adjustment of the mA  and/or kV according to patient size and/or use of iterative reconstruction technique. COMPARISON:  None Available. FINDINGS: Brain: There are no signs of bleeding within the cranium. There is a 2.6 cm area of low-density in posterior left occipital lobe. There is subcentimeter low-density in the left basal ganglia. Cortical sulci are prominent. Vascular: Scattered arterial calcifications are seen. Skull: Unremarkable. Sinuses/Orbits: There is mucosal thickening in ethmoid sinus. Other: None. IMPRESSION: There are no signs of bleeding within the cranium. There is no focal mass effect. There is 2.6 cm low-density in the posterior left occipital lobe suggesting possible encephalomalacia from previous infarction. Less likely possibility would be space-occupying lesion. Follow-up MRI may be considered. The subcentimeter low-density in the left basal ganglia suggesting possible old lacunar infarct. Atrophy. Electronically Signed   By: Ernie Avena M.D.   On: 07/22/2022 14:02   DG Chest Portable 1 View  Result Date: 07/22/2022 CLINICAL DATA:  Shortness of breath EXAM: PORTABLE CHEST 1 VIEW COMPARISON:  10/21/2015 FINDINGS: Cardiac size is within normal limits. There is poor inspiration. There are no signs of pulmonary edema or focal pulmonary consolidation. There is no pleural effusion or pneumothorax. IMPRESSION: There are no signs of pulmonary edema or focal pulmonary consolidation. Electronically Signed   By: Ernie Avena M.D.   On: 07/22/2022 13:56     Assessment: 57 year old female presenting with lethargy, nausea and vomiting. Her CBG was over 600 with EMS. Brain imaging reveals multiple acute ischemic infarctions within the left PCA territory. - Exam reveals right facial droop, dysarthria and right hemiplegia.  - Imaging: - Initial CT head (5/2): There is 2.6 cm low-density in the posterior left occipital lobe suggesting possible encephalomalacia from previous infarction. Less likely  possibility would be space-occupying lesion. Subcentimeter low-density in the left basal ganglia suggesting possible old lacunar infarct. Atrophy. - Follow up CT head (overnight): Stable multifocal subacute infarcts within the left occipital lobe, thalamus, genu of the internal capsule, and cerebral peduncle. No superimposed acute intracranial hemorrhage.  No new abnormal mass effect or midline shift (both CT scans personally reviewed and the second CT read appears to be more accurate than the first).  - MRI brain: Multifocal acute ischemia within the left PCA territory, including the occipital lobe, temporal lobe and thalamus. No acute or chronic hemorrhage. There is multifocal hyperintense T2-weighted signal within the white matter. Parenchymal volume and CSF spaces are normal. The midline structures are normal. Major flow voids are preserved. - CTA of head and neck: Severe/critical stenosis versus occlusion of the proximal left PCA with poor/irregular distal opacification. Severe bilateral intracranial ICA stenosis. Approximately 70% stenosis of the right ICA origin in the neck. Severe stenosis of the small/non dominant right vertebral artery origin. 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 - Labs: - Glucose 244, AST/ALT normal. BUN and Cr normal.  - Elevated total cholesterol, low HDL, elevated LDL and triglycerides - CBC normal - HgbA1c markedly elevated at 14.5 - U/A not consistent with UTI - EtOH level < 10 - UDS negative - EKG: Sinus rhythm; Probable left atrial enlargement; Left anterior fascicular block; Anterior infarct, old - Echocardiogram completed with report pending  Recommendations: - Cardiac telemetry - Agree with starting DAPT with Plavix and ASA. Continue both for 21 days, then discontinue Plavix and continue with ASA monotherapy indefinitely.  - Agree with starting atorvastatin.  - Continue permissive HTN for an additional 24 hours. Treat  SBP if > 220.  - PT/OT/Speech  - Frequent neuro checks - NPO until passes stroke swallow screen - Glycemic control  Electronically signed: Dr. Caryl Pina 07/23/2022, 8:14 AM

## 2022-07-23 NOTE — ED Notes (Signed)
Echo at bedside

## 2022-07-24 DIAGNOSIS — R739 Hyperglycemia, unspecified: Secondary | ICD-10-CM

## 2022-07-24 LAB — GLUCOSE, CAPILLARY
Glucose-Capillary: 166 mg/dL — ABNORMAL HIGH (ref 70–99)
Glucose-Capillary: 201 mg/dL — ABNORMAL HIGH (ref 70–99)
Glucose-Capillary: 290 mg/dL — ABNORMAL HIGH (ref 70–99)
Glucose-Capillary: 281 mg/dL — ABNORMAL HIGH (ref 70–99)

## 2022-07-24 LAB — BLOOD GAS, VENOUS
Patient temperature: 37
pCO2, Ven: 50 mmHg (ref 44–60)
pH, Ven: 7.36 (ref 7.25–7.43)

## 2022-07-24 LAB — CULTURE, BLOOD (ROUTINE X 2)

## 2022-07-24 MED ORDER — ASPIRIN 81 MG PO CHEW
81.0000 mg | CHEWABLE_TABLET | Freq: Every day | ORAL | Status: DC
  Administered 2022-07-24 – 2022-08-03 (×11): 81 mg via ORAL
  Filled 2022-07-24 (×12): qty 1

## 2022-07-24 NOTE — Progress Notes (Signed)
PROGRESS NOTE    Destiny Palmer  JYN:[id number] DOB: 10/24/1965 DOA: 07/22/2022 PCP: [facility]    Brief Narrative:   57 y.o. female with medical history significant of diabetes mellitus reportedly nonadherent to diabetic regimen who presents to the ED with chief complaint of altered mentation.  Patient has been lethargic with nausea and vomiting.  Remains alert and oriented x 3.  Apparently CBG was over 600 with EMS.  310 on BMP on arrival.   Apparently had some slurred speech and difficulty walking.  Patient unable to provide much history.  On my evaluation patient is resting comfortably in bed.  She is alert and oriented x 3.  She is a little confused on the location, states we are in [place].  Does have a psychiatric history including adjustment disorder, history of suicide attempt in 2017, PTSD.  Psychiatric regimen is listed on her home medication reconciliation but there are no documented outpatient visits for several years so unclear exactly what she has been taken.   Urinalysis shows significant glucosuria and some evidence of leukocytes.  No fever.  CT head demonstrates evidence of old CVA versus less likely space-occupying lesion.   Laboratory investigation overall unrevealing apart from some hyperglycemia.  Remainder of CMP within normal limits.  Urine drug screen negative.  Chest x-ray negative  5/3: Stroke alert called last night for acute right lower extremity and right upper extremity weakness.  MRI that was ordered by myself did demonstrate large multifocal acute infarct in PCA territory.  Teleneurology consulted.  Started on aspirin Plavix dual antiplatelet therapy.  5/4: no appreciable changes in exam   Assessment & Plan:   Principal Problem:   Acute metabolic encephalopathy Active Problems:   Cerebrovascular accident (CVA) (HCC)  Acute CVA Acute metabolic encephalopathy And now appears that patient's altered mentation was secondary to evolving infarct.   Unknown last known well.  Remains markedly weak on the right Plan: Continue DAPT aspirin and Plavix x 3 weeks followed by aspirin monotherapy indefinitely Continue high intensity statin Telemetry monitoring Out of window for permissive hypertension.  Normotensive blood pressure goal Will need skilled nursing facility at time of discharge  Insulin-dependent diabetes mellitus with hyperglycemia Hemoglobin A1c 14.5.  Patient not adherent to prescribed insulin regimen. Plan: Semglee 15 units nightly Moderate sliding scale Nightly coverage Carb modified diet Diabetes coordinator consult Will need insulin regimen at time of discharge  Essential hypertension Out of window for permissive hypertension.  Normotensive blood pressure goal. Hyperlipidemia Lipitor 40 mg daily  Depression Psychiatric disorder not otherwise specified PTA Prozac As needed Atarax Nightly respite oh    DVT prophylaxis: SQ Lovenox Code Status: Full Family Communication:  Disposition Plan: Status is: Inpatient Remains inpatient appropriate because: Acute CVA.  Workup in progress   Level of care: Progressive  Consultants:  Neurology  Procedures:  None  Antimicrobials: Rocephin    Subjective: Seen and examined.  Remains markedly weak on the right.  Able to eat this morning.  Objective: Vitals:   07/23/22 2012 07/24/22 0415 07/24/22 0814 07/24/22 1132  BP: 134/74 112/72 116/75 114/65  Pulse: 64 74 69 64  Resp: 17 18 18 16   Temp: 97.9 F (36.6 C) 98 F (36.7 C) 97.9 F (36.6 C) (!) 97.4 F (36.3 C)  TempSrc:   Oral   SpO2: 100% 98% 98% 98%  Weight:      Height:        Intake/Output Summary (Last 24 hours) at 07/24/2022 1413 Last data filed at  07/24/2022 1100 Gross per 24 hour  Intake 1856.48 ml  Output 1200 ml  Net 656.48 ml   Filed Weights   07/22/22 1700  Weight: 68.9 kg    Examination:  General exam: No acute distress Respiratory system: Lungs clear.  Normal breathing.  Room  air Cardiovascular system: S1-S2, RRR, no murmurs, no pedal edema Gastrointestinal system: Soft, NT/ND, normal bowel sounds Central nervous system: Alert and oriented.  RUE/RLE marked deficit Extremities: Markedly decreased power right upper and lower extremities Skin: No rashes, lesions or ulcers Psychiatry: Judgement and insight appear impaired. Mood & affect flattened.     Data Reviewed: I have personally reviewed following labs and imaging studies  CBC: Recent Labs  Lab 07/22/22 1309 07/23/22 0411  WBC 6.9 6.3  NEUTROABS 4.6  --   HGB 17.1* 13.1  HCT 51.0* 39.7  MCV 90.3 90.8  PLT 269 190   Basic Metabolic Panel: Recent Labs  Lab 07/22/22 1548 07/23/22 0411  NA 137 139  K 5.1 3.6  CL 101 102  CO2 27 29  GLUCOSE 310* 244*  BUN 15 15  CREATININE 0.65 0.62  CALCIUM 8.9 8.5*  MG 1.9 1.8  PHOS 3.3 3.0   GFR: Estimated Creatinine Clearance: 72.6 mL/min (by C-G formula based on SCr of 0.62 mg/dL). Liver Function Tests: Recent Labs  Lab 07/22/22 1548 07/23/22 0411  AST 33 23  ALT 22 23  ALKPHOS 84 71  BILITOT 1.5* 0.8  PROT 6.8 6.1*  ALBUMIN 3.2* 2.9*   No results for input(s): "LIPASE", "AMYLASE" in the last 168 hours. No results for input(s): "AMMONIA" in the last 168 hours. Coagulation Profile: No results for input(s): "INR", "PROTIME" in the last 168 hours. Cardiac Enzymes: No results for input(s): "CKTOTAL", "CKMB", "CKMBINDEX", "TROPONINI" in the last 168 hours. BNP (last 3 results) No results for input(s): "PROBNP" in the last 8760 hours. HbA1C: Recent Labs    07/22/22 1309  HGBA1C 14.5*   CBG: Recent Labs  Lab 07/23/22 1137 07/23/22 1645 07/23/22 2154 07/24/22 0831 07/24/22 1133  GLUCAP 168* 214* 173* 166* 201*   Lipid Profile: Recent Labs    07/23/22 0411  CHOL 224*  HDL 30*  LDLCALC 158*  TRIG 181*  CHOLHDL 7.5   Thyroid Function Tests: No results for input(s): "TSH", "T4TOTAL", "FREET4", "T3FREE", "THYROIDAB" in the last  72 hours. Anemia Panel: No results for input(s): "VITAMINB12", "FOLATE", "FERRITIN", "TIBC", "IRON", "RETICCTPCT" in the last 72 hours. Sepsis Labs: Recent Labs  Lab 07/22/22 1755  LATICACIDVEN 1.4    Recent Results (from the past 240 hour(s))  Urine Culture     Status: Abnormal   Collection Time: 07/22/22  1:07 PM   Specimen: Urine, Clean Catch  Result Value Ref Range Status   Specimen Description   Final    URINE, CLEAN CATCH Performed at [facility], [address], [place], [place] [address]    Special Requests   Final    NONE Performed at [facility], [address], [place], [place] [address]    Culture (A)  Final    40,000 COLONIES/mL STREPTOCOCCUS AGALACTIAE TESTING AGAINST S. AGALACTIAE NOT ROUTINELY PERFORMED DUE TO PREDICTABILITY OF AMP/PEN/VAN SUSCEPTIBILITY. Performed at [facility], [address] [place], [place] [address]    Report Status 07/23/2022 FINAL  Final  Blood culture (routine x 2)     Status: None (Preliminary result)   Collection Time: 07/22/22  5:48 PM   Specimen: BLOOD  Result Value Ref Range  Status   Specimen Description BLOOD RIGHT ANTECUBITAL  Final   Special Requests   Final    BOTTLES DRAWN AEROBIC AND ANAEROBIC Blood Culture adequate volume   Culture   Final    NO GROWTH 2 DAYS Performed at [facility], [address] [place], [place] [address]    Report Status PENDING  Incomplete  Blood culture (routine x 2)     Status: None (Preliminary result)   Collection Time: 07/22/22  5:55 PM   Specimen: BLOOD  Result Value Ref Range Status   Specimen Description BLOOD LEFT ANTECUBITAL  Final   Special Requests   Final    BOTTLES DRAWN AEROBIC AND ANAEROBIC Blood Culture adequate volume   Culture   Final    NO GROWTH 2 DAYS Performed at [facility], [address], [place], [place] [address]    Report Status PENDING  Incomplete         Radiology Studies: CT HEAD WO CONTRAST  ( )  Result Date: 07/23/2022 CLINICAL DATA:  Stroke follow-up. EXAM: CT HEAD WITHOUT CONTRAST TECHNIQUE: Contiguous axial images were obtained from the base of the skull through the vertex without intravenous contrast. RADIATION DOSE REDUCTION: This exam was performed according to the departmental dose-optimization program which includes automated exposure control, adjustment of the mA and/or kV according to patient size and/or use of iterative reconstruction technique. COMPARISON:  07/23/2022 and 07/22/2022. FINDINGS: Brain: No new infarcts. Hypoattenuation reflecting the recent infarcts noted in the left occipital lobe left middle cerebral peduncle and adjacent genu of the internal capsule with a small area in the subcortical left frontal lobe. Small focus noted along the anterolateral thalamus. These are all stable from the most recent prior CT, 07/23/2022. No intracranial hemorrhage. Ventricles normal in size and configuration. No extra-axial masses or abnormal fluid collections. Vascular: No hyperdense vessel or unexpected calcification. Skull: Normal. Negative for fracture or focal lesion. Sinuses/Orbits: Globes and orbits are unremarkable. Mild ethmoid sinus mucosal thickening. Other: None. IMPRESSION: 1. No significant change when compared to the most recent prior head CT. 2. Multiple infarcts as detailed. No new infarcts. No intracranial hemorrhage. Electronically Signed   By: Amie Portland M.D.   On: 07/23/2022 17:32   ECHOCARDIOGRAM COMPLETE  Result Date: 07/23/2022    ECHOCARDIOGRAM REPORT   Patient Name:   LATOYAH MCPETERS Date of Exam: 07/23/2022 Medical Rec #:  [id number]        Height:       66.0 in Accession #:    [id number]       Weight:       152.0 lb Date of Birth:  06-06-1965        BSA:          1.780 m Patient Age:    56 years         BP:           124/70 mmHg Patient Gender: F                HR:           61 bpm. Exam Location:  [facility] Procedure: 2D Echo, Cardiac Doppler and Color Doppler  Indications:     Stroke I63.9  History:         Patient has no prior history of Echocardiogram examinations.                  Risk Factors:Diabetes and Hypertension.  Sonographer:     Cristela Blue Referring Phys:  [id number] BRENDA MORRISON Diagnosing Phys: Julien Nordmann MD  Sonographer Comments: No subcostal window. IMPRESSIONS  1. Left ventricular ejection fraction, by estimation, is 60 to 65%. The left ventricle has normal function. The left ventricle has no regional wall motion abnormalities. Left ventricular diastolic parameters are consistent with Grade I diastolic dysfunction (impaired relaxation).  2. Right ventricular systolic function is normal. The right ventricular size is normal. There is normal pulmonary artery systolic pressure. The estimated right ventricular systolic pressure is 24.5 mmHg.  3. The mitral valve is normal in structure. Mild mitral valve regurgitation. No evidence of mitral stenosis.  4. The aortic valve is normal in structure. Aortic valve regurgitation is not visualized. No aortic stenosis is present.  5. The inferior vena cava is normal in size with greater than 50% respiratory variability, suggesting right atrial pressure of 3 mmHg. FINDINGS  Left Ventricle: Left ventricular ejection fraction, by estimation, is 60 to 65%. The left ventricle has normal function. The left ventricle has no regional wall motion abnormalities. The left ventricular internal cavity size was normal in size. There is  no left ventricular hypertrophy. Left ventricular diastolic parameters are consistent with Grade I diastolic dysfunction (impaired relaxation). Right Ventricle: The right ventricular size is normal. No increase in right ventricular wall thickness. Right ventricular systolic function is normal. There is normal pulmonary artery systolic pressure. The tricuspid regurgitant velocity is 2.21 m/s, and  with an assumed right atrial pressure of 5 mmHg, the estimated right ventricular systolic pressure is  24.5 mmHg. Left Atrium: Left atrial size was normal in size. Right Atrium: Right atrial size was normal in size. Pericardium: There is no evidence of pericardial effusion. Mitral Valve: The mitral valve is normal in structure. Mild mitral valve regurgitation. No evidence of mitral valve stenosis. MV peak gradient, 6.0 mmHg. The mean mitral valve gradient is 2.0 mmHg. Tricuspid Valve: The tricuspid valve is normal in structure. Tricuspid valve regurgitation is mild . No evidence of tricuspid stenosis. Aortic Valve: The aortic valve is normal in structure. Aortic valve regurgitation is not visualized. No aortic stenosis is present. Aortic valve mean gradient measures 4.0 mmHg. Aortic valve peak gradient measures 6.9 mmHg. Aortic valve area, by VTI measures 2.07 cm. Pulmonic Valve: The pulmonic valve was normal in structure. Pulmonic valve regurgitation is not visualized. No evidence of pulmonic stenosis. Aorta: The aortic root is normal in size and structure. Venous: The inferior vena cava is normal in size with greater than 50% respiratory variability, suggesting right atrial pressure of 3 mmHg. IAS/Shunts: No atrial level shunt detected by color flow Doppler.  LEFT VENTRICLE PLAX 2D LVIDd:         3.30 cm   Diastology LVIDs:         2.10 cm   LV e' medial:    5.77 cm/s LV PW:         1.10 cm   LV E/e' medial:  17.3 LV IVS:        1.30 cm   LV e' lateral:   11.30 cm/s LVOT diam:     1.90 cm   LV E/e' lateral: 8.8 LV SV:         56 LV SV Index:   31 LVOT Area:     2.84 cm  RIGHT VENTRICLE RV Basal diam:  3.00 cm RV Mid diam:    3.00 cm RV S prime:     11.00 cm/s TAPSE (M-mode): 1.2 cm LEFT ATRIUM  Index        RIGHT ATRIUM          Index LA diam:      2.70 cm 1.52 cm/m   RA Area:     8.62 cm LA Vol (A2C): 22.4 ml 12.59 ml/m  RA Volume:   15.00 ml 8.43 ml/m LA Vol (A4C): 25.7 ml 14.44 ml/m  AORTIC VALVE AV Area (Vmax):    1.74 cm AV Area (Vmean):   1.87 cm AV Area (VTI):     2.07 cm AV Vmax:            131.00 cm/s AV Vmean:          89.533 cm/s AV VTI:            0.268 m AV Peak Grad:      6.9 mmHg AV Mean Grad:      4.0 mmHg LVOT Vmax:         80.60 cm/s LVOT Vmean:        58.900 cm/s LVOT VTI:          0.196 m LVOT/AV VTI ratio: 0.73  AORTA Ao Root diam: 3.10 cm MITRAL VALVE                TRICUSPID VALVE MV Area (PHT): 2.87 cm     TR Peak grad:   19.5 mmHg MV Area VTI:   2.06 cm     TR Vmax:        221.00 cm/s MV Peak grad:  6.0 mmHg MV Mean grad:  2.0 mmHg     SHUNTS MV Vmax:       1.22 m/s     Systemic VTI:  0.20 m MV Vmean:      68.3 cm/s    Systemic Diam: 1.90 cm MV Decel Time: 264 msec MV E velocity: 100.00 cm/s MV A velocity: 92.80 cm/s MV E/A ratio:  1.08 Julien Nordmann MD Electronically signed by Julien Nordmann MD Signature Date/Time: 07/23/2022/2:28:42 PM    Final    CT ANGIO HEAD NECK W WO CM  Result Date: 07/23/2022 CLINICAL DATA:  Stroke, follow up EXAM: CT ANGIOGRAPHY HEAD AND NECK WITH AND WITHOUT CONTRAST TECHNIQUE: Multidetector CT imaging of the head and neck was performed using the standard protocol during bolus administration of intravenous contrast. Multiplanar CT image reconstructions and MIPs were obtained to evaluate the vascular anatomy. Carotid stenosis measurements (when applicable) are obtained utilizing NASCET criteria, using the distal internal carotid diameter as the denominator. RADIATION DOSE REDUCTION: This exam was performed according to the departmental dose-optimization program which includes automated exposure control, adjustment of the mA and/or kV according to patient size and/or use of iterative reconstruction technique. CONTRAST:  75mL OMNIPAQUE IOHEXOL 350 MG/ML SOLN COMPARISON:  MRI Jul 22, 2022. FINDINGS: CT HEAD FINDINGS Brain: Similar appearance of multifocal evolving infarcts in the left PCA distribution, better characterized on prior MRI. No evidence of progressive mass effect or mass occupying acute hemorrhage. No midline shift. No hydrocephalus. Vascular: See  below. Skull: No acute fracture. Sinuses/Orbits: Paranasal sinus mucosal thickening. No acute orbital findings. Review of the MIP images confirms the above findings CTA NECK FINDINGS Aortic arch: Great vessel origins are patent without significant stenosis. Right carotid system: Approximately 70% stenosis of the right ICA origin. Left carotid system: Atherosclerosis at the carotid bifurcation without greater than 50% stenosis. Vertebral arteries: Left dominant. Severe stenosis of the proximal right non dominant/small vertebral artery. Left vertebral artery is patent. Skeleton: No acute  abnormality on limited assessment. Other neck: Negative. Upper chest: Visualized lung apices are clear. Review of the MIP images confirms the above findings CTA HEAD FINDINGS Anterior circulation: Severe proximal left cavernous ICA stenosis. Severe bilateral paraclinoid ICA stenosis bilaterally. Bilateral MCAs are patent without proximal 8 image late significant stenosis. Hypoplastic left ACA. Right A1 ACA and bilateral A2 ACAs are patent without proximal hemodynamically significant stenosis. 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 Severe/critical stenosis versus occlusion of the proximal left PCA with poor/irregular distal opacification. Venous sinuses: As permitted by contrast timing, patent. Review of the MIP images confirms the above findings IMPRESSION: 1. Severe/critical stenosis versus occlusion of the proximal left PCA with poor/irregular distal opacification. 2. Severe bilateral intracranial ICA stenosis. 3. Approximately 70% stenosis of the right ICA origin in the neck. 4. Severe stenosis of the small/non dominant right vertebral artery origin. 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 Electronically Signed   By: Feliberto Harts M.D.   On: 07/23/2022 09:19   CT HEAD WO CONTRAST ( )  Result Date: 07/23/2022 CLINICAL  DATA:  Stroke, worsening neurologic examination EXAM: CT HEAD WITHOUT CONTRAST TECHNIQUE: Contiguous axial images were obtained from the base of the skull through the vertex without intravenous contrast. RADIATION DOSE REDUCTION: This exam was performed according to the departmental dose-optimization program which includes automated exposure control, adjustment of the mA and/or kV according to patient size and/or use of iterative reconstruction technique. COMPARISON:  07/22/2022 FINDINGS: Brain: Stable multifocal subacute infarct within the left occipital lobe, thalamus, genu of the internal capsule, and cerebral peduncle. 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 No superimposed acute intracranial hemorrhage. No new abnormal mass effect or midline shift. Ventricular size is normal. Cerebellum is unremarkable. Vascular: No hyperdense vessel or unexpected calcification. Skull: Normal. Negative for fracture or focal lesion. Sinuses/Orbits: Paranasal sinuses are clear. Orbits are unremarkable. Other: Mastoid air cells and middle ear cavities are clear. IMPRESSION: 1. 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 No superimposed acute intracranial hemorrhage. No new abnormal mass effect or midline shift. Electronically Signed   By: Helyn Numbers M.D.   On: 07/23/2022 00:51   MR BRAIN WO CONTRAST  Result Date: 07/22/2022 CLINICAL DATA:  Altered mental status EXAM: MRI HEAD WITHOUT CONTRAST TECHNIQUE: Multiplanar, multiecho pulse sequences of the brain and surrounding structures were obtained without intravenous contrast. COMPARISON:  None Available. FINDINGS: Brain: Multifocal acute ischemia within the left PCA territory, including the occipital lobe, temporal lobe and thalamus. No acute or chronic hemorrhage. There is multifocal hyperintense T2-weighted signal within the white matter. Parenchymal volume and CSF  spaces are normal. The midline structures are normal. Vascular: Major flow voids are preserved. Skull and upper cervical spine: Normal calvarium and skull base. Visualized upper cervical spine and soft tissues are normal. Sinuses/Orbits:No paranasal sinus fluid levels or advanced mucosal thickening. No mastoid or middle ear effusion. Normal orbits. IMPRESSION: Multifocal acute ischemia within the left PCA territory, including the occipital lobe, temporal lobe and thalamus. No hemorrhage or mass effect. Electronically Signed   By: Deatra Robinson M.D.   On: 07/22/2022 20:41        Scheduled Meds:   stroke: early stages of recovery book   Does not apply Once   aspirin  81 mg Oral Daily   atorvastatin  40 mg Oral Daily   clopidogrel  75 mg Oral Daily   enoxaparin (  LOVENOX) injection  40 mg Subcutaneous Q24H   FLUoxetine  40 mg Oral Daily   insulin aspart  0-15 Units Subcutaneous TID WC   insulin aspart  0-5 Units Subcutaneous QHS   insulin glargine-yfgn  15 Units Subcutaneous QHS   Continuous Infusions:  sodium chloride 75 mL/hr at 07/24/22 1232     LOS: 1 day      Tresa Moore, MD [facility]   If 7PM-7AM, please contact night-coverage  07/24/2022, 2:13 PM

## 2022-07-24 NOTE — Evaluation (Signed)
Clinical/Bedside Swallow Evaluation Patient Details  Name: Destiny Palmer MRN: [id number] Date of Birth: 07/19/1965  Today's Date: 07/24/2022 Time: SLP Start Time (ACUTE ONLY): 0950 SLP Stop Time (ACUTE ONLY): 1050 SLP Time Calculation (min) (ACUTE ONLY): 60 min  Past Medical History:  Past Medical History:  Diagnosis Date   Diabetes mellitus without complication (HCC)    Hypertension    Past Surgical History:  Past Surgical History:  Procedure Laterality Date   NO PAST SURGERIES     SHOULDER CLOSED REDUCTION Left 03/30/2016   Procedure: CLOSED REDUCTION SHOULDER;  Surgeon: Donato Heinz, MD;  Location: [facility] ORS;  Service: Orthopedics;  Laterality: Left;   HPI:  Pt is an 57 y.o. female with a PMHx of DM and HTN who presented to the ED yesterday with lethargy, nausea and vomiting. Her CBG was over 600 with EMS. Family noted her to have some slurred speech and difficulty walking. She had recently stopped taking her metformin. She did not fit criteria for DKA but HHS was on the DDx. She was treated with IVF and glucose trended downward. UDS was negative. U/A showed significant glucosuria.      CT head revealed multifocal subacute infarcts within the left occipital lobe, thalamus, genu of the internal capsule, and cerebral peduncle.      MRI was then obtained, confirming multiple subacute ischemic infarctions in the left PCA territory.      About 1 hour after completion of MRI, she exhibited neurological worsening and a Code Stroke was called. Teleneurology evaluated the patient with exam revealing an NIHSS of 10, which was worse than her exams documented earlier. As there was no clear LKN, she was not a candidate for IV thrombolysis or mechanical thrombectomy. DAPT was initiated along with IVF.    Assessment / Plan / Recommendation  Clinical Impression   Pt seen for BSE today. Pt resting in bed. Looked to this speaker but could not maintain attention to R side. Also tended to push away and lean  to L side. Noted significant R sided weakness/falccid in RUE. Receptive and Expressive language deficits; Apraxia apparent. Engagement was inconsistent at times but she responded well to verbal cues to reattend. Noted perseverations during speech responses. On RA, afebrile, WBC WNL.  Pt appears to present w/ oropharyngeal phase dysphagia w/ oropharyngeal phase swallowing deficits and sensorimotor deficits impacting the swallow. Pt is at increased risk for aspiration/aspiration pneumonia.   Pt exhibited inconsistent awareness/engagement during po tasks which increases risk for aspiration. She respondied well to verbal/tactile stim and cues redirecting her attention to task. Pt required min-mod tactile/verbal/visual cues for orientation to bolus presentation, follow through w/ tasks. Pt consumed trials of single ice chips, Nectar liquids via tsp/Cup, and purees. No immediate, overt clinical s/s of aspiration noted w/ trials given; no cough and no decline in respiratory presentation noted during/post trials. Suspect potential delayed pharyngeal swallowin initiation(d/t decreased awareness) which could have impact on safety and success of pharyngeal swallowing. Oral phase was c/b slow, deliberate bolus management and intermittent pauses in A-P transfer of Nectar and puree trials. Oral clearing was adequate w/ all boluses given; slight lingual residue/coating on R lateral(weakness) w/ the pudding trials. She required increased Time for A-P transfer -- unsure if related to overall oral weakness or to decreased attention/awareness.  OM exam was challenged by Apraxic-like oral behaviors. She exhibited R lingua/labial weakness; R velum weakness(CNX). ROM was functional; no anterior leakage occurred.   Recommend initiation of dysphagia level 1 diet(purees) moistened w/  Nectar consistency liquids via tsp/cup; strict aspiration precautions; Pills Crushed in puree for safety; feeding support and 100% supervision at  meals, reduce Distractions during meals and check for oral clearing during/post intake. NSG/MD updated. ST services will continue to follow for ongoing assessment/tx; will f/u for cog-language evaluation.  SLP Visit Diagnosis: Dysphagia, oropharyngeal phase (R13.12) (new CVA)    Aspiration Risk  Moderate aspiration risk;Risk for inadequate nutrition/hydration    Diet Recommendation   dysphagia level 1 diet(purees) moistened w/ Nectar consistency liquids via tsp/cup; strict aspiration precautions; feeding support and 100% supervision at meals, reduce Distractions during meals and check for oral clearing during/post intake.   Medication Administration: Crushed with puree    Other  Recommendations Recommended Consults:  (Dietician) Oral Care Recommendations: Oral care BID;Oral care before and after PO;Staff/trained caregiver to provide oral care Caregiver Recommendations: Avoid jello, ice cream, thin soups, popsicles;Remove water pitcher;Have oral suction available    Recommendations for follow up therapy are one component of a multi-disciplinary discharge planning process, led by the attending physician.  Recommendations may be updated based on patient status, additional functional criteria and insurance authorization.  Follow up Recommendations Follow physician's recommendations for discharge plan and follow up therapies      Assistance Recommended at Discharge  full  Functional Status Assessment Patient has had a recent decline in their functional status and demonstrates the ability to make significant improvements in function in a reasonable and predictable amount of time.  Frequency and Duration min 3x week  2 weeks       Prognosis Prognosis for improved oropharyngeal function: Good Barriers to Reach Goals: Language deficits;Time post onset;Severity of deficits Barriers/Prognosis Comment: new CVA, extension      Swallow Study   General Date of Onset: 07/22/22 HPI: Pt is an 57  y.o. female with a PMHx of DM and HTN who presented to the ED yesterday with lethargy, nausea and vomiting. Her CBG was over 600 with EMS. Family noted her to have some slurred speech and difficulty walking. She had recently stopped taking her metformin. She did not fit criteria for DKA but HHS was on the DDx. She was treated with IVF and glucose trended downward. UDS was negative. U/A showed significant glucosuria.      CT head revealed multifocal subacute infarcts within the left occipital lobe, thalamus, genu of the internal capsule, and cerebral peduncle.      MRI was then obtained, confirming multiple subacute ischemic infarctions in the left PCA territory.      About 1 hour after completion of MRI, she exhibited neurological worsening and a Code Stroke was called. Teleneurology evaluated the patient with exam revealing an NIHSS of 10, which was worse than her exams documented earlier. As there was no clear LKN, she was not a candidate for IV thrombolysis or mechanical thrombectomy. DAPT was initiated along with IVF. Type of Study: Bedside Swallow Evaluation Previous Swallow Assessment: none Diet Prior to this Study: NPO Temperature Spikes Noted: No (wbc 6.3) Respiratory Status: Room air History of Recent Intubation: No Behavior/Cognition: Alert;Cooperative;Pleasant mood;Confused;Distractible;Requires cueing (Language deficits/Aphasia; Apraxia) Oral Cavity Assessment: Dry (sticky) Oral Care Completed by SLP: Yes Oral Cavity - Dentition: Adequate natural dentition Vision:  (n/a) Self-Feeding Abilities: Total assist Patient Positioning: Upright in bed (needed positioning) Baseline Vocal Quality: Normal (w/ 4-5 words spoken) Volitional Cough: Cognitively unable to elicit Volitional Swallow: Unable to elicit    Oral/Motor/Sensory Function Overall Oral Motor/Sensory Function: Moderate impairment Facial ROM: Reduced right;Suspected CN VII (facial) dysfunction Facial  Symmetry: Abnormal symmetry  right;Suspected CN VII (facial) dysfunction Facial Strength: Reduced right;Suspected CN VII (facial) dysfunction Facial Sensation: Reduced right;Suspected CN V (Trigeminal) dysfunction Lingual ROM: Reduced right;Suspected CN XII (hypoglossal) dysfunction Lingual Symmetry: Abnormal symmetry right;Suspected CN XII (hypoglossal) dysfunction Lingual Strength: Reduced (posterior improved over anterior) Velum: Impaired right;Suspected CN X (Vagus) dysfunction Mandible: Within Functional Limits   Ice Chips Ice chips: Within functional limits Presentation: Spoon (fed; 10 trials) Other Comments: slightly munchy = apraxic?   Thin Liquid Thin Liquid: Not tested    Nectar Thick Nectar Thick Liquid: Impaired Presentation: Spoon (fed; 12 trials) Oral Phase Impairments: Poor awareness of bolus;Reduced lingual movement/coordination;Reduced labial seal Oral phase functional implications: Prolonged oral transit Pharyngeal Phase Impairments:  (no overt)   Honey Thick Honey Thick Liquid: Not tested   Puree Puree: Impaired Presentation: Spoon (fed; ~6 ozs) Oral Phase Impairments: Reduced lingual movement/coordination;Poor awareness of bolus;Reduced labial seal Oral Phase Functional Implications: Prolonged oral transit Pharyngeal Phase Impairments:  (no overt)   Solid     Solid: Not tested         Jerilynn Som, MS, CCC-SLP Speech Language Pathologist Rehab Services; [facility] [place] [phone] (ascom) Random Dobrowski 07/24/2022,12:49 PM

## 2022-07-24 NOTE — TOC Initial Note (Addendum)
Transition of Care [facility]) - Initial/Assessment Note    Patient Details  Name: Destiny Palmer MRN: [id number] Date of Birth: [birth year]  [facility]) CM/SW Contact:    Darolyn Rua, LCSW Phone Number: 07/24/2022, 9:58 AM  Clinical Narrative:                  CSW spoke with patient's sister Ermalene Searing regarding SNF recommendations. She reports she is in agreement, states priori to admission patient was living with her. Reports patient is [facility] service connected with [facility], informed her limited facilities for SNF are in network but will send out to see if [facility] can review amongst any other VA service connected SNFs.   Pending bed offers at this time.   CSW has emailed Wonda Cerise, Psychologist, forensic with [facility] to ensure patient has SNF benefits, Pending response.   The following SNF referrals have been sent that are in network with [facility] :  [facility]  [facility] - Johna Sheriff in [place]  Faxed referral to : [phone]  [facility]  Faxed referral to : [phone]  [facility] in [place] Faxed referral to : [phone]  Expected Discharge Plan: Skilled Nursing Facility Barriers to Discharge: Continued Medical Work up   Patient Goals and CMS Choice Patient states their goals for this hospitalization and ongoing recovery are:: to go home CMS Medicare.gov Compare Post Acute Care list provided to:: Patient Choice offered to / list presented to : Patient      Expected Discharge Plan and Services       Living arrangements for the past 2 months: Single Family Home                                      Prior Living Arrangements/Services Living arrangements for the past 2 months: Single Family Home Lives with:: Siblings                   Activities of Daily Living      Permission Sought/Granted                  Emotional Assessment               Admission diagnosis:  Hyperglycemia [R73.9] Acute CVA (cerebrovascular accident) (HCC) [I63.9] Altered mental status, unspecified altered mental status type [R41.82] Acute metabolic encephalopathy [G93.41] Patient Active Problem List   Diagnosis Date Noted   Acute CVA (cerebrovascular accident) (HCC) 07/23/2022   Acute metabolic encephalopathy 07/22/2022   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

## 2022-07-24 NOTE — Progress Notes (Addendum)
Physical Therapy Treatment Patient Details Name: Destiny Palmer MRN: [id number] DOB: Mar 19, 1966 Today's Date: 07/24/2022   History of Present Illness Destiny Palmer is a 56yoF who comes to [facility] on 5/2 with AMS, BG in 600s on arrival. Code stoke called later in evening after acute onset motor deficits RUE. PMH: DM, HTN, SI, Lt shoulder dislocation s/p closed reduction 2018. MRI brain revealing "multifocal acute ischemia within the left PCA territory, including the occipital lobe, temporal lobe and thalamus."  At baseline pt denies any impairment of gait or balance, not use of AD, reports to live with mother whom she provides caregiver assistance.    PT Comments    Skilled PT session focused on NMR for upper trunk and RLE control, bed mobility (modA ) and transfer training (lateral scooting min A).  Pt able to follow instructions well during session.  Pt continues to have significant R extremity weakness and poor midline awareness. Pt has had recent significant change in her capacity and independence in mobility, will need extensive rehab in the coming months. Pt is motivated and would benefit from 3hrs of therapy/day.   Recommendations for follow up therapy are one component of a multi-disciplinary discharge planning process, led by the attending physician.  Recommendations may be updated based on patient status, additional functional criteria and insurance authorization.  Follow Up Recommendations  Can patient physically be transported by private vehicle: No    Assistance Recommended at Discharge Intermittent Supervision/Assistance  Patient can return home with the following Two people to help with walking and/or transfers;Two people to help with bathing/dressing/bathroom;Help with stairs or ramp for entrance;Assist for transportation   Equipment Recommendations  None recommended by PT    Recommendations for Other Services       Precautions / Restrictions Precautions Precautions:  Fall Precaution Comments: hemi RUE Restrictions Weight Bearing Restrictions: No     Mobility  Bed Mobility Overal bed mobility: Needs Assistance Bed Mobility: Supine to Sit, Sit to Supine     Supine to sit: Mod assist Sit to supine: Min assist   General bed mobility comments: puts forth good effort; educated pt on how to use LLE to assist with RLE movment; required min A to establish sitting balance.    Transfers Overall transfer level: Needs assistance Equipment used:  (used LUE external suppport) Transfers:  (scooting up EOB)            Lateral/Scoot Transfers: Min assist General transfer comment: worked on lateral scoot up to [facility]. PTA seated in chair with armrests in front of pt to manage RUE/LE.  Pt used armrest for LUE exteranl support. Pt able to lift hips off bed to scoot, multiple reps.    Ambulation/Gait               General Gait Details: not assess at this time.   Stairs             Wheelchair Mobility    Modified Rankin (Stroke Patients Only)       Balance Overall balance assessment: Needs assistance Sitting-balance support: Single extremity supported, Feet unsupported Sitting balance-Leahy Scale: Fair     Standing balance support: Single extremity supported, Reliant on assistive device for balance Standing balance-Leahy Scale: Poor Standing balance comment: performed multiple squats almost to standing but not full stand.                            Cognition Arousal/Alertness: Awake/alert Behavior During Therapy: [facility]  for tasks assessed/performed Overall Cognitive Status: Difficult to assess                                 General Comments: follows 1 step commands        Exercises Other Exercises Other Exercises: NMR for Upper trunk control seated EOB: working on finding midline, weight shifting towards the L., Active resistance for forward and R lateral trunk positions for muscle activation.  AAROM  for seated R hamtring curls, Rknee extensions and R ankle movements 5x2 each for muscle acitvation/awareness.    General Comments        Pertinent Vitals/Pain Pain Assessment Pain Assessment: No/denies pain    Home Living                          Prior Function            PT Goals (current goals can now be found in the care plan section) Acute Rehab PT Goals PT Goal Formulation: Patient unable to participate in goal setting Progress towards PT goals: Progressing toward goals    Frequency    Min 4X/week      PT Plan Discharge plan needs to be updated    Co-evaluation              AM-PAC PT "6 Clicks" Mobility   Outcome Measure  Help needed turning from your back to your side while in a flat bed without using bedrails?: A Lot Help needed moving from lying on your back to sitting on the side of a flat bed without using bedrails?: A Lot Help needed moving to and from a bed to a chair (including a wheelchair)?: A Lot Help needed standing up from a chair using your arms (e.g., wheelchair or bedside chair)?: A Lot Help needed to walk in hospital room?: Total Help needed climbing 3-5 steps with a railing? : Total 6 Click Score: 10    End of Session Equipment Utilized During Treatment: Gait belt Activity Tolerance: Patient tolerated treatment well;No increased pain Patient left: in bed;with call bell/phone within reach;Other (comment) Nurse Communication: Mobility status PT Visit Diagnosis: Other abnormalities of gait and mobility (R26.89);Unsteadiness on feet (R26.81);Difficulty in walking, not elsewhere classified (R26.2);Muscle weakness (generalized) (M62.81);Other symptoms and signs involving the nervous system (R29.898);Hemiplegia and hemiparesis Hemiplegia - Right/Left: Right Hemiplegia - dominant/non-dominant: Dominant Hemiplegia - caused by: Cerebral infarction     Time: 1610-9604 PT Time Calculation (min) (ACUTE ONLY): 29 min  Charges:   $Therapeutic Activity: 8-22 mins $Neuromuscular Re-education: 8-22 mins                     Hortencia Conradi, PTA  07/24/22, 2:50 PM

## 2022-07-24 NOTE — NC FL2 (Signed)
[place] MEDICAID FL2 LEVEL OF CARE FORM     IDENTIFICATION  Patient Name: Destiny Palmer Birthdate: 25-Aug-1965 Sex: female Admission Date (Current Location): 07/22/2022  [facility] and [place] Number:  Chiropodist and Address:  [facility], [address] [place], [place] [address]      Provider Number: [id number]  Attending Physician Name and Address:  Tresa Moore, MD  Relative Name and Phone Number:  Beverely Low (sister)  [phone]    Current Level of Care: Hospital Recommended Level of Care: Skilled Nursing Facility Prior Approval Number:    Date Approved/Denied:   PASRR Number: [id number]  Discharge Plan: SNF    Current Diagnoses: Patient Active Problem List   Diagnosis Date Noted   Acute CVA (cerebrovascular accident) (HCC) 07/23/2022   Acute metabolic encephalopathy 07/22/2022   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   Suicide attempt (HCC) 10/22/2015   PTSD (post-traumatic stress disorder) 10/22/2015   Diabetes mellitus without complication (HCC) 10/22/2015    Orientation RESPIRATION BLADDER Height & Weight     Self, Time    Continent Weight: 152 lb (68.9 kg) Height:  5\' 6"  (167.6 cm)  BEHAVIORAL SYMPTOMS/MOOD NEUROLOGICAL BOWEL NUTRITION STATUS      Continent Diet (see discharge summary)  AMBULATORY STATUS COMMUNICATION OF NEEDS Skin   Extensive Assist Verbally Normal                       Personal Care Assistance Level of Assistance  Bathing, Feeding, Dressing, Total care Bathing Assistance: Limited assistance Feeding assistance: Limited assistance Dressing Assistance: Maximum assistance Total Care Assistance: Maximum assistance   Functional Limitations Info  Sight, Hearing, Speech Sight Info: Adequate Hearing Info: Adequate Speech Info: Adequate    SPECIAL CARE FACTORS FREQUENCY  PT (By licensed PT), OT (By licensed OT)     PT Frequency: min 4x  weekly OT Frequency: min 4x weekly            Contractures Contractures Info: Not present    Additional Factors Info  Code Status, Allergies Code Status Info: full Allergies Info: nortriptyline           Current Medications (07/24/2022):  This is the current hospital active medication list Current Facility-Administered Medications  Medication Dose Route Frequency Provider Last Rate Last Admin    stroke: early stages of recovery book   Does not apply Once Manuela Schwartz, NP       0.9 %  sodium chloride infusion   Intravenous Continuous Georgeann Oppenheim, Sudheer B, MD   Stopped at 07/23/22 2255   acetaminophen (TYLENOL) tablet 650 mg  650 mg Oral Q6H PRN Lolita Patella B, MD       Or   acetaminophen (TYLENOL) suppository 650 mg  650 mg Rectal Q6H PRN Sreenath, Sudheer B, MD       albuterol (PROVENTIL) (2.5 MG/3ML) 0.083% nebulizer solution 2.5 mg  2.5 mg Nebulization Q2H PRN Lolita Patella B, MD       aspirin EC tablet 81 mg  81 mg Oral Daily Manuela Schwartz, NP   81 mg at 07/23/22 0920   atorvastatin (LIPITOR) tablet 40 mg  40 mg Oral Daily Manuela Schwartz, NP   40 mg at 07/23/22 0920   clopidogrel (PLAVIX) tablet 75 mg  75 mg Oral Daily Manuela Schwartz, NP   75 mg at 07/23/22 0919   enoxaparin (LOVENOX) injection 40 mg  40 mg Subcutaneous Q24H Tresa Moore, MD  40 mg at 07/23/22 2239   FLUoxetine (PROZAC) capsule 40 mg  40 mg Oral Daily Lolita Patella B, MD   40 mg at 07/23/22 0920   hydrALAZINE (APRESOLINE) injection 10 mg  10 mg Intravenous Q6H PRN Lolita Patella B, MD       hydrOXYzine (ATARAX) tablet 25 mg  25 mg Oral TID PRN Lolita Patella B, MD       insulin aspart (novoLOG) injection 0-15 Units  0-15 Units Subcutaneous TID WC Lolita Patella B, MD   5 Units at 07/23/22 1652   insulin aspart (novoLOG) injection 0-5 Units  0-5 Units Subcutaneous QHS Lolita Patella B, MD   3 Units at 07/22/22 2225   insulin glargine-yfgn (SEMGLEE) injection 15 Units  15  Units Subcutaneous QHS Lolita Patella B, MD   15 Units at 07/23/22 2239   ondansetron (ZOFRAN) tablet 4 mg  4 mg Oral Q6H PRN Lolita Patella B, MD       Or   ondansetron (ZOFRAN) injection 4 mg  4 mg Intravenous Q6H PRN Sreenath, Sudheer B, MD       oxyCODONE (Oxy IR/ROXICODONE) immediate release tablet 5 mg  5 mg Oral Q4H PRN Tresa Moore, MD         Discharge Medications: Please see discharge summary for a list of discharge medications.  Relevant Imaging Results:  Relevant Lab Results:   Additional Information SSN: [id number]  Darolyn Rua, LCSW

## 2022-07-24 NOTE — Progress Notes (Signed)
Subjective: The patient states that she feels about the same as compared to yesterday.   Objective: Current vital signs: BP 114/65 (BP Location: Left Arm)   Pulse 64   Temp (!) 97.4 F (36.3 C)   Resp 16   Ht 5\' 6"  (1.676 m)   Wt 68.9 kg   LMP  (LMP Unknown)   SpO2 98%   BMI 24.53 kg/m  Vital signs in last 24 hours: Temp:  [97.4 F (36.3 C)-98.2 F (36.8 C)] 97.4 F (36.3 C) (05/04 1132) Pulse Rate:  [64-74] 64 (05/04 1132) Resp:  [14-18] 16 (05/04 1132) BP: (112-134)/(65-82) 114/65 (05/04 1132) SpO2:  [96 %-100 %] 98 % (05/04 1132)  Intake/Output from previous day: 05/03 0701 - 05/04 0700 In: 1556.5 [I.V.:1556.5] Out: -  Intake/Output this shift: Total I/O In: 300 [I.V.:300] Out: 1200 [Urine:1200] Nutritional status:  Diet Order             DIET - DYS 1 Room service appropriate? Yes with Assist; Fluid consistency: Nectar Thick  Diet effective now                  Physical Exam  HEENT-  [place]/AT   Lungs- Respirations unlabored Extremities- No edema    Neurological Examination Mental Status: Awake and alert. Speech is moderately dysarthric. Poverty of speech noted but no definite loss of fluency. Comprehension intact. Apathy and abulia are noted.  Cranial Nerves: II, III,IV, VI: Visually fixates on examiner intermittently during conversation. EOMI. No nystagmus.  VII: Prominent right facial droop.  VIII: Hearing intact to voice IX,X: Mildly hypophonic speech XI: Head is midline Motor: RUE 0/5 with flaccid tone RLE 1/5 with flaccid tone LUE and LLE 5/5 Sensory: Light touch subjectively intact and symmetric in all 4 extremities.  Cerebellar: Not motivated to participate with cerebellar testing today.   Gait: Unable to assess  Lab Results: Results for orders placed or performed during the hospital encounter of 07/22/22 (from the past 48 hour(s))  CBG monitoring, ED     Status: Abnormal   Collection Time: 07/22/22  1:03 PM  Result Value Ref Range    Glucose-Capillary 478 (H) 70 - 99 mg/dL    Comment: Glucose reference range applies only to samples taken after fasting for at least 8 hours.  Blood gas, venous     Status: Abnormal   Collection Time: 07/22/22  1:04 PM  Result Value Ref Range   pH, Ven 7.36 7.25 - 7.43   pCO2, Ven 50 44 - 60 mmHg   pO2, Ven <31 (LL) 32 - 45 mmHg   Bicarbonate 28.2 (H) 20.0 - 28.0 mmol/L   Acid-Base Excess 1.9 0.0 - 2.0 mmol/L   O2 Saturation 29.6 %   Patient temperature 37.0    Collection site VEIN     Comment: Performed at [facility], [address] [place], [place] [address]  Beta-hydroxybutyric acid     Status: Abnormal   Collection Time: 07/22/22  1:07 PM  Result Value Ref Range   Beta-Hydroxybutyric Acid 1.95 (H) 0.05 - 0.27 mmol/L    Comment: Performed at [facility], [address] [place], [place] [address]  Ethanol     Status: None   Collection Time: 07/22/22  1:07 PM  Result Value Ref Range   Alcohol, Ethyl (B) <10 <10 mg/dL    Comment: (NOTE) Lowest detectable limit for serum alcohol is 10 mg/dL.  For medical purposes only. Performed at [facility], [address] [place],  [place] [address]   Urine Culture     Status: Abnormal   Collection Time: 07/22/22  1:07 PM   Specimen: Urine, Clean Catch  Result Value Ref Range   Specimen Description      URINE, CLEAN CATCH Performed at [facility], [address] [place], [place] [address]    Special Requests      NONE Performed at [facility], [address] [place], [place] [address]    Culture (A)     40,000 COLONIES/mL STREPTOCOCCUS AGALACTIAE TESTING AGAINST S. AGALACTIAE NOT ROUTINELY PERFORMED DUE TO PREDICTABILITY OF AMP/PEN/VAN SUSCEPTIBILITY. Performed at [facility], [address], [place], [place] [address]    Report Status 07/23/2022 FINAL   CBC with Differential     Status: Abnormal   Collection Time: 07/22/22  1:09 PM  Result Value Ref Range   WBC  6.9 4.0 - 10.5 K/uL   RBC 5.65 (H) 3.87 - 5.11 MIL/uL   Hemoglobin 17.1 (H) 12.0 - 15.0 g/dL   HCT 29.5 (H) 62.1 - 30.8 %   MCV 90.3 80.0 - 100.0 fL   MCH 30.3 26.0 - 34.0 pg   MCHC 33.5 30.0 - 36.0 g/dL   RDW 65.7 84.6 - 96.2 %   Platelets 269 150 - 400 K/uL   nRBC 0.0 0.0 - 0.2 %   Neutrophils Relative % 68 %   Neutro Abs 4.6 1.7 - 7.7 K/uL   Lymphocytes Relative 25 %   Lymphs Abs 1.7 0.7 - 4.0 K/uL   Monocytes Relative 6 %   Monocytes Absolute 0.4 0.1 - 1.0 K/uL   Eosinophils Relative 1 %   Eosinophils Absolute 0.1 0.0 - 0.5 K/uL   Basophils Relative 0 %   Basophils Absolute 0.0 0.0 - 0.1 K/uL   Immature Granulocytes 0 %   Abs Immature Granulocytes 0.02 0.00 - 0.07 K/uL    Comment: Performed at [facility], [address] [place], [place] [address]  Hemoglobin A1c     Status: Abnormal   Collection Time: 07/22/22  1:09 PM  Result Value Ref Range   Hgb A1c MFr Bld 14.5 (H) 4.8 - 5.6 %    Comment: (NOTE) Pre diabetes:          5.7%-6.4%  Diabetes:              >6.4%  Glycemic control for   <7.0% adults with diabetes    Mean Plasma Glucose 369.45 mg/dL    Comment: Performed at [facility], [address] [place], [place] [address]  Comprehensive metabolic panel     Status: Abnormal   Collection Time: 07/22/22  3:48 PM  Result Value Ref Range   Sodium 137 135 - 145 mmol/L   Potassium 5.1 3.5 - 5.1 mmol/L    Comment: HEMOLYSIS AT THIS LEVEL MAY AFFECT RESULT   Chloride 101 98 - 111 mmol/L   CO2 27 22 - 32 mmol/L   Glucose, Bld 310 (H) 70 - 99 mg/dL    Comment: Glucose reference range applies only to samples taken after fasting for at least 8 hours.   BUN 15 6 - 20 mg/dL   Creatinine, Ser 0.10 0.44 - 1.00 mg/dL   Calcium 8.9 8.9 - 27.2 mg/dL   Total Protein 6.8 6.5 - 8.1 g/dL   Albumin 3.2 (L) 3.5 - 5.0 g/dL   AST 33 15 - 41 U/L    Comment: HEMOLYSIS AT THIS LEVEL MAY AFFECT RESULT  ALT 22 0 - 44 U/L    Comment: HEMOLYSIS AT THIS LEVEL MAY  AFFECT RESULT   Alkaline Phosphatase 84 38 - 126 U/L   Total Bilirubin 1.5 (H) 0.3 - 1.2 mg/dL    Comment: HEMOLYSIS AT THIS LEVEL MAY AFFECT RESULT   GFR, Estimated >60 >60 mL/min    Comment: (NOTE) Calculated using the CKD-EPI Creatinine Equation (2021)    Anion gap 9 5 - 15    Comment: Performed at [facility], [address] [place], [place] [address]  Magnesium     Status: None   Collection Time: 07/22/22  3:48 PM  Result Value Ref Range   Magnesium 1.9 1.7 - 2.4 mg/dL    Comment: Performed at [facility], [address] [place], [place] [address]  Phosphorus     Status: None   Collection Time: 07/22/22  3:48 PM  Result Value Ref Range   Phosphorus 3.3 2.5 - 4.6 mg/dL    Comment: HEMOLYSIS AT THIS LEVEL MAY AFFECT RESULT Performed at [facility], [address] [place], [place] [address]   Urine Drug Screen, Qualitative ([facility] only)     Status: None   Collection Time: 07/22/22  4:30 PM  Result Value Ref Range   Tricyclic, Ur Screen NONE DETECTED NONE DETECTED   Amphetamines, Ur Screen NONE DETECTED NONE DETECTED   MDMA (Ecstasy)Ur Screen NONE DETECTED NONE DETECTED   Cocaine Metabolite,Ur [place] NONE DETECTED NONE DETECTED   Opiate, Ur Screen NONE DETECTED NONE DETECTED   Phencyclidine (PCP) Ur S NONE DETECTED NONE DETECTED   Cannabinoid 50 Ng, Ur [place] NONE DETECTED NONE DETECTED   Barbiturates, Ur Screen NONE DETECTED NONE DETECTED   Benzodiazepine, Ur Scrn NONE DETECTED NONE DETECTED   Methadone Scn, Ur NONE DETECTED NONE DETECTED    Comment: (NOTE) Tricyclics + metabolites, urine    Cutoff 1000 ng/mL Amphetamines + metabolites, urine  Cutoff 1000 ng/mL MDMA (Ecstasy), urine              Cutoff 500 ng/mL Cocaine Metabolite, urine          Cutoff 300 ng/mL Opiate + metabolites, urine        Cutoff 300 ng/mL Phencyclidine (PCP), urine         Cutoff 25 ng/mL Cannabinoid, urine                 Cutoff 50 ng/mL Barbiturates + metabolites,  urine  Cutoff 200 ng/mL Benzodiazepine, urine              Cutoff 200 ng/mL Methadone, urine                   Cutoff 300 ng/mL  The urine drug screen provides only a preliminary, unconfirmed analytical test result and should not be used for non-medical purposes. Clinical consideration and professional judgment should be applied to any positive drug screen result due to possible interfering substances. A more specific alternate chemical method must be used in order to obtain a confirmed analytical result. Gas chromatography / mass spectrometry (GC/MS) is the preferred confirm atory method. Performed at [facility], [address] [place], [place] [address]   Urinalysis, Routine w reflex microscopic -Urine, Clean Catch     Status: Abnormal   Collection Time: 07/22/22  4:30 PM  Result Value Ref Range   Color, Urine YELLOW (A) YELLOW   APPearance CLEAR (A) CLEAR   Specific Gravity, Urine 1.032 (H) 1.005 - 1.030  pH 6.0 5.0 - 8.0   Glucose, UA >=500 (A) NEGATIVE mg/dL   Hgb urine dipstick NEGATIVE NEGATIVE   Bilirubin Urine NEGATIVE NEGATIVE   Ketones, ur 20 (A) NEGATIVE mg/dL   Protein, ur NEGATIVE NEGATIVE mg/dL   Nitrite NEGATIVE NEGATIVE   Leukocytes,Ua TRACE (A) NEGATIVE   RBC / HPF 0-5 0 - 5 RBC/hpf   WBC, UA 6-10 0 - 5 WBC/hpf   Bacteria, UA RARE (A) NONE SEEN   Squamous Epithelial / HPF 0-5 0 - 5 /HPF   Mucus PRESENT     Comment: Performed at [facility], [address] [place] [address] [place], [place] [address]  Blood culture (routine x 2)     Status: None (Preliminary result)   Collection Time: 07/22/22  5:48 PM   Specimen: BLOOD  Result Value Ref Range   Specimen Description BLOOD RIGHT ANTECUBITAL    Special Requests      BOTTLES DRAWN AEROBIC AND ANAEROBIC Blood Culture adequate volume   Culture      NO GROWTH 2 DAYS Performed at [facility], [address] [place], [place] [address]    Report Status PENDING   Blood culture (routine x 2)      Status: None (Preliminary result)   Collection Time: 07/22/22  5:55 PM   Specimen: BLOOD  Result Value Ref Range   Specimen Description BLOOD LEFT ANTECUBITAL    Special Requests      BOTTLES DRAWN AEROBIC AND ANAEROBIC Blood Culture adequate volume   Culture      NO GROWTH 2 DAYS Performed at [facility], [address] [place], [place] [address]    Report Status PENDING   Lactic acid, plasma     Status: None   Collection Time: 07/22/22  5:55 PM  Result Value Ref Range   Lactic Acid, Venous 1.4 0.5 - 1.9 mmol/L    Comment: Performed at [facility], [address] [place], [place] [address]  CBG monitoring, ED     Status: Abnormal   Collection Time: 07/22/22  9:27 PM  Result Value Ref Range   Glucose-Capillary 264 (H) 70 - 99 mg/dL    Comment: Glucose reference range applies only to samples taken after fasting for at least 8 hours.  CBG monitoring, ED     Status: Abnormal   Collection Time: 07/22/22 11:44 PM  Result Value Ref Range   Glucose-Capillary 233 (H) 70 - 99 mg/dL    Comment: Glucose reference range applies only to samples taken after fasting for at least 8 hours.  CBG monitoring, ED     Status: Abnormal   Collection Time: 07/23/22 12:17 AM  Result Value Ref Range   Glucose-Capillary 258 (H) 70 - 99 mg/dL    Comment: Glucose reference range applies only to samples taken after fasting for at least 8 hours.  HIV Antibody (routine testing w rflx)     Status: None   Collection Time: 07/23/22  4:11 AM  Result Value Ref Range   HIV Screen 4th Generation wRfx Non Reactive Non Reactive    Comment: Performed at [facility], [address] [place], [place] [address]  Comprehensive metabolic panel     Status: Abnormal   Collection Time: 07/23/22  4:11 AM  Result Value Ref Range   Sodium 139 135 - 145 mmol/L   Potassium 3.6 3.5 - 5.1 mmol/L   Chloride 102 98 - 111 mmol/L   CO2 29 22 - 32 mmol/L   Glucose,  Bld 244 (H) 70 - 99 mg/dL    Comment:  Glucose reference range applies only to samples taken after fasting for at least 8 hours.   BUN 15 6 - 20 mg/dL   Creatinine, Ser 7.42 0.44 - 1.00 mg/dL   Calcium 8.5 (L) 8.9 - 10.3 mg/dL   Total Protein 6.1 (L) 6.5 - 8.1 g/dL   Albumin 2.9 (L) 3.5 - 5.0 g/dL   AST 23 15 - 41 U/L   ALT 23 0 - 44 U/L   Alkaline Phosphatase 71 38 - 126 U/L   Total Bilirubin 0.8 0.3 - 1.2 mg/dL   GFR, Estimated >59 >56 mL/min    Comment: (NOTE) Calculated using the CKD-EPI Creatinine Equation (2021)    Anion gap 8 5 - 15    Comment: Performed at [facility], [address] [place], [place] [address]  CBC     Status: None   Collection Time: 07/23/22  4:11 AM  Result Value Ref Range   WBC 6.3 4.0 - 10.5 K/uL   RBC 4.37 3.87 - 5.11 MIL/uL   Hemoglobin 13.1 12.0 - 15.0 g/dL   HCT 43.3 29.5 - 18.8 %   MCV 90.8 80.0 - 100.0 fL   MCH 30.0 26.0 - 34.0 pg   MCHC 33.0 30.0 - 36.0 g/dL   RDW 41.6 60.6 - 30.1 %   Platelets 190 150 - 400 K/uL   nRBC 0.0 0.0 - 0.2 %    Comment: Performed at [facility], [address] [place], [place] [address]  Lipid panel     Status: Abnormal   Collection Time: 07/23/22  4:11 AM  Result Value Ref Range   Cholesterol 224 (H) 0 - 200 mg/dL   Triglycerides 323 (H) <150 mg/dL   HDL 30 (L) >55 mg/dL   Total CHOL/HDL Ratio 7.5 RATIO   VLDL 36 0 - 40 mg/dL   LDL Cholesterol 732 (H) 0 - 99 mg/dL    Comment:        Total Cholesterol/HDL:CHD Risk Coronary Heart Disease Risk Table                     Men   Women  1/2 Average Risk   3.4   3.3  Average Risk       5.0   4.4  2 X Average Risk   9.6   7.1  3 X Average Risk  23.4   11.0        Use the calculated Patient Ratio above and the CHD Risk Table to determine the patient's CHD Risk.        ATP III CLASSIFICATION (LDL):  <100     mg/dL   Optimal  202-542  mg/dL   Near or Above                    Optimal  130-159  mg/dL   Borderline  706-237  mg/dL   High  >628     mg/dL   Very High Performed  at [facility], [address] [place], [place] [address]   Magnesium     Status: None   Collection Time: 07/23/22  4:11 AM  Result Value Ref Range   Magnesium 1.8 1.7 - 2.4 mg/dL    Comment: Performed at [facility], [address], [place], [place] [address]  Phosphorus     Status: None   Collection Time: 07/23/22  4:11 AM  Result  Value Ref Range   Phosphorus 3.0 2.5 - 4.6 mg/dL    Comment: Performed at [facility], [address] [place], [place] [address]  CBG monitoring, ED     Status: Abnormal   Collection Time: 07/23/22  8:13 AM  Result Value Ref Range   Glucose-Capillary 212 (H) 70 - 99 mg/dL    Comment: Glucose reference range applies only to samples taken after fasting for at least 8 hours.  CBG monitoring, ED     Status: Abnormal   Collection Time: 07/23/22 11:37 AM  Result Value Ref Range   Glucose-Capillary 168 (H) 70 - 99 mg/dL    Comment: Glucose reference range applies only to samples taken after fasting for at least 8 hours.  CBG monitoring, ED     Status: Abnormal   Collection Time: 07/23/22  4:45 PM  Result Value Ref Range   Glucose-Capillary 214 (H) 70 - 99 mg/dL    Comment: Glucose reference range applies only to samples taken after fasting for at least 8 hours.   Comment 1 Notify RN    Comment 2 Document in Chart   Glucose, capillary     Status: Abnormal   Collection Time: 07/23/22  9:54 PM  Result Value Ref Range   Glucose-Capillary 173 (H) 70 - 99 mg/dL    Comment: Glucose reference range applies only to samples taken after fasting for at least 8 hours.  Glucose, capillary     Status: Abnormal   Collection Time: 07/24/22  8:31 AM  Result Value Ref Range   Glucose-Capillary 166 (H) 70 - 99 mg/dL    Comment: Glucose reference range applies only to samples taken after fasting for at least 8 hours.  Glucose, capillary     Status: Abnormal   Collection Time: 07/24/22 11:33 AM  Result Value Ref Range   Glucose-Capillary  201 (H) 70 - 99 mg/dL    Comment: Glucose reference range applies only to samples taken after fasting for at least 8 hours.    Recent Results (from the past 240 hour(s))  Urine Culture     Status: Abnormal   Collection Time: 07/22/22  1:07 PM   Specimen: Urine, Clean Catch  Result Value Ref Range Status   Specimen Description   Final    URINE, CLEAN CATCH Performed at [facility], [address] [place], [place] [address]    Special Requests   Final    NONE Performed at [facility], [address] [place], [place] [address]    Culture (A)  Final    40,000 COLONIES/mL STREPTOCOCCUS AGALACTIAE TESTING AGAINST S. AGALACTIAE NOT ROUTINELY PERFORMED DUE TO PREDICTABILITY OF AMP/PEN/VAN SUSCEPTIBILITY. Performed at [facility], [address] [place], [place] [address]    Report Status 07/23/2022 FINAL  Final  Blood culture (routine x 2)     Status: None (Preliminary result)   Collection Time: 07/22/22  5:48 PM   Specimen: BLOOD  Result Value Ref Range Status   Specimen Description BLOOD RIGHT ANTECUBITAL  Final   Special Requests   Final    BOTTLES DRAWN AEROBIC AND ANAEROBIC Blood Culture adequate volume   Culture   Final    NO GROWTH 2 DAYS Performed at [facility], [address], [place], [place] [address]    Report Status PENDING  Incomplete  Blood culture (routine x 2)     Status: None (Preliminary result)   Collection Time: 07/22/22  5:55 PM   Specimen: BLOOD  Result Value Ref Range Status   Specimen Description BLOOD LEFT ANTECUBITAL  Final   Special Requests   Final    BOTTLES DRAWN AEROBIC AND ANAEROBIC Blood Culture adequate volume   Culture   Final    NO GROWTH 2 DAYS Performed at [facility], [address] [place], [place] [address]    Report Status PENDING  Incomplete    Lipid Panel Recent Labs    07/23/22 0411  CHOL 224*  TRIG 181*  HDL 30*  CHOLHDL 7.5  VLDL 36  LDLCALC 604*     Studies/Results: CT HEAD WO CONTRAST ( )  Result Date: 07/23/2022 CLINICAL DATA:  Stroke follow-up. EXAM: CT HEAD WITHOUT CONTRAST TECHNIQUE: Contiguous axial images were obtained from the base of the skull through the vertex without intravenous contrast. RADIATION DOSE REDUCTION: This exam was performed according to the departmental dose-optimization program which includes automated exposure control, adjustment of the mA and/or kV according to patient size and/or use of iterative reconstruction technique. COMPARISON:  07/23/2022 and 07/22/2022. FINDINGS: Brain: No new infarcts. Hypoattenuation reflecting the recent infarcts noted in the left occipital lobe left middle cerebral peduncle and adjacent genu of the internal capsule with a small area in the subcortical left frontal lobe. Small focus noted along the anterolateral thalamus. These are all stable from the most recent prior CT, 07/23/2022. No intracranial hemorrhage. Ventricles normal in size and configuration. No extra-axial masses or abnormal fluid collections. Vascular: No hyperdense vessel or unexpected calcification. Skull: Normal. Negative for fracture or focal lesion. Sinuses/Orbits: Globes and orbits are unremarkable. Mild ethmoid sinus mucosal thickening. Other: None. IMPRESSION: 1. No significant change when compared to the most recent prior head CT. 2. Multiple infarcts as detailed. No new infarcts. No intracranial hemorrhage. Electronically Signed   By: Amie Portland M.D.   On: 07/23/2022 17:32   ECHOCARDIOGRAM COMPLETE  Result Date: 07/23/2022    ECHOCARDIOGRAM REPORT   Patient Name:   QUADIRAH HOLDEN Date of Exam: 07/23/2022 Medical Rec #:  [id number]        Height:       66.0 in Accession #:    [id number]       Weight:       152.0 lb Date of Birth:  1965/10/18        BSA:          1.780 m Patient Age:    56 years         BP:           124/70 mmHg Patient Gender: F                HR:           61 bpm. Exam Location:  [facility] Procedure:  2D Echo, Cardiac Doppler and Color Doppler Indications:     Stroke I63.9  History:         Patient has no prior history of Echocardiogram examinations.                  Risk Factors:Diabetes and Hypertension.  Sonographer:     Cristela Blue Referring Phys:  [id number] BRENDA MORRISON Diagnosing Phys: Julien Nordmann MD  Sonographer Comments: No subcostal window. IMPRESSIONS  1. Left ventricular ejection fraction, by estimation, is 60 to 65%. The left ventricle has normal function. The left ventricle has no regional wall motion abnormalities. Left ventricular diastolic parameters are consistent with Grade I diastolic dysfunction (impaired relaxation).  2. Right ventricular systolic function is normal. The  right ventricular size is normal. There is normal pulmonary artery systolic pressure. The estimated right ventricular systolic pressure is 24.5 mmHg.  3. The mitral valve is normal in structure. Mild mitral valve regurgitation. No evidence of mitral stenosis.  4. The aortic valve is normal in structure. Aortic valve regurgitation is not visualized. No aortic stenosis is present.  5. The inferior vena cava is normal in size with greater than 50% respiratory variability, suggesting right atrial pressure of 3 mmHg. FINDINGS  Left Ventricle: Left ventricular ejection fraction, by estimation, is 60 to 65%. The left ventricle has normal function. The left ventricle has no regional wall motion abnormalities. The left ventricular internal cavity size was normal in size. There is  no left ventricular hypertrophy. Left ventricular diastolic parameters are consistent with Grade I diastolic dysfunction (impaired relaxation). Right Ventricle: The right ventricular size is normal. No increase in right ventricular wall thickness. Right ventricular systolic function is normal. There is normal pulmonary artery systolic pressure. The tricuspid regurgitant velocity is 2.21 m/s, and  with an assumed right atrial pressure of 5 mmHg, the  estimated right ventricular systolic pressure is 24.5 mmHg. Left Atrium: Left atrial size was normal in size. Right Atrium: Right atrial size was normal in size. Pericardium: There is no evidence of pericardial effusion. Mitral Valve: The mitral valve is normal in structure. Mild mitral valve regurgitation. No evidence of mitral valve stenosis. MV peak gradient, 6.0 mmHg. The mean mitral valve gradient is 2.0 mmHg. Tricuspid Valve: The tricuspid valve is normal in structure. Tricuspid valve regurgitation is mild . No evidence of tricuspid stenosis. Aortic Valve: The aortic valve is normal in structure. Aortic valve regurgitation is not visualized. No aortic stenosis is present. Aortic valve mean gradient measures 4.0 mmHg. Aortic valve peak gradient measures 6.9 mmHg. Aortic valve area, by VTI measures 2.07 cm. Pulmonic Valve: The pulmonic valve was normal in structure. Pulmonic valve regurgitation is not visualized. No evidence of pulmonic stenosis. Aorta: The aortic root is normal in size and structure. Venous: The inferior vena cava is normal in size with greater than 50% respiratory variability, suggesting right atrial pressure of 3 mmHg. IAS/Shunts: No atrial level shunt detected by color flow Doppler.  LEFT VENTRICLE PLAX 2D LVIDd:         3.30 cm   Diastology LVIDs:         2.10 cm   LV e' medial:    5.77 cm/s LV PW:         1.10 cm   LV E/e' medial:  17.3 LV IVS:        1.30 cm   LV e' lateral:   11.30 cm/s LVOT diam:     1.90 cm   LV E/e' lateral: 8.8 LV SV:         56 LV SV Index:   31 LVOT Area:     2.84 cm  RIGHT VENTRICLE RV Basal diam:  3.00 cm RV Mid diam:    3.00 cm RV S prime:     11.00 cm/s TAPSE (M-mode): 1.2 cm LEFT ATRIUM           Index        RIGHT ATRIUM          Index LA diam:      2.70 cm 1.52 cm/m   RA Area:     8.62 cm LA Vol (A2C): 22.4 ml 12.59 ml/m  RA Volume:   15.00 ml 8.43 ml/m LA Vol (A4C): 25.7  ml 14.44 ml/m  AORTIC VALVE AV Area (Vmax):    1.74 cm AV Area (Vmean):   1.87  cm AV Area (VTI):     2.07 cm AV Vmax:           131.00 cm/s AV Vmean:          89.533 cm/s AV VTI:            0.268 m AV Peak Grad:      6.9 mmHg AV Mean Grad:      4.0 mmHg LVOT Vmax:         80.60 cm/s LVOT Vmean:        58.900 cm/s LVOT VTI:          0.196 m LVOT/AV VTI ratio: 0.73  AORTA Ao Root diam: 3.10 cm MITRAL VALVE                TRICUSPID VALVE MV Area (PHT): 2.87 cm     TR Peak grad:   19.5 mmHg MV Area VTI:   2.06 cm     TR Vmax:        221.00 cm/s MV Peak grad:  6.0 mmHg MV Mean grad:  2.0 mmHg     SHUNTS MV Vmax:       1.22 m/s     Systemic VTI:  0.20 m MV Vmean:      68.3 cm/s    Systemic Diam: 1.90 cm MV Decel Time: 264 msec MV E velocity: 100.00 cm/s MV A velocity: 92.80 cm/s MV E/A ratio:  1.08 Julien Nordmann MD Electronically signed by Julien Nordmann MD Signature Date/Time: 07/23/2022/2:28:42 PM    Final    CT ANGIO HEAD NECK W WO CM  Result Date: 07/23/2022 CLINICAL DATA:  Stroke, follow up EXAM: CT ANGIOGRAPHY HEAD AND NECK WITH AND WITHOUT CONTRAST TECHNIQUE: Multidetector CT imaging of the head and neck was performed using the standard protocol during bolus administration of intravenous contrast. Multiplanar CT image reconstructions and MIPs were obtained to evaluate the vascular anatomy. Carotid stenosis measurements (when applicable) are obtained utilizing NASCET criteria, using the distal internal carotid diameter as the denominator. RADIATION DOSE REDUCTION: This exam was performed according to the departmental dose-optimization program which includes automated exposure control, adjustment of the mA and/or kV according to patient size and/or use of iterative reconstruction technique. CONTRAST:  75mL OMNIPAQUE IOHEXOL 350 MG/ML SOLN COMPARISON:  MRI Jul 22, 2022. FINDINGS: CT HEAD FINDINGS Brain: Similar appearance of multifocal evolving infarcts in the left PCA distribution, better characterized on prior MRI. No evidence of progressive mass effect or mass occupying acute hemorrhage.  No midline shift. No hydrocephalus. Vascular: See below. Skull: No acute fracture. Sinuses/Orbits: Paranasal sinus mucosal thickening. No acute orbital findings. Review of the MIP images confirms the above findings CTA NECK FINDINGS Aortic arch: Great vessel origins are patent without significant stenosis. Right carotid system: Approximately 70% stenosis of the right ICA origin. Left carotid system: Atherosclerosis at the carotid bifurcation without greater than 50% stenosis. Vertebral arteries: Left dominant. Severe stenosis of the proximal right non dominant/small vertebral artery. Left vertebral artery is patent. Skeleton: No acute abnormality on limited assessment. Other neck: Negative. Upper chest: Visualized lung apices are clear. Review of the MIP images confirms the above findings CTA HEAD FINDINGS Anterior circulation: Severe proximal left cavernous ICA stenosis. Severe bilateral paraclinoid ICA stenosis bilaterally. Bilateral MCAs are patent without proximal 8 image late significant stenosis. Hypoplastic left ACA. Right A1 ACA and bilateral A2 ACAs  are patent without proximal hemodynamically significant stenosis. 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 Severe/critical stenosis versus occlusion of the proximal left PCA with poor/irregular distal opacification. Venous sinuses: As permitted by contrast timing, patent. Review of the MIP images confirms the above findings IMPRESSION: 1. Severe/critical stenosis versus occlusion of the proximal left PCA with poor/irregular distal opacification. 2. Severe bilateral intracranial ICA stenosis. 3. Approximately 70% stenosis of the right ICA origin in the neck. 4. Severe stenosis of the small/non dominant right vertebral artery origin. 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 Electronically Signed   By: Feliberto Harts M.D.   On: 07/23/2022 09:19   CT HEAD WO  CONTRAST ( )  Result Date: 07/23/2022 CLINICAL DATA:  Stroke, worsening neurologic examination EXAM: CT HEAD WITHOUT CONTRAST TECHNIQUE: Contiguous axial images were obtained from the base of the skull through the vertex without intravenous contrast. RADIATION DOSE REDUCTION: This exam was performed according to the departmental dose-optimization program which includes automated exposure control, adjustment of the mA and/or kV according to patient size and/or use of iterative reconstruction technique. COMPARISON:  07/22/2022 FINDINGS: Brain: Stable multifocal subacute infarct within the left occipital lobe, thalamus, genu of the internal capsule, and cerebral peduncle. 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 No superimposed acute intracranial hemorrhage. No new abnormal mass effect or midline shift. Ventricular size is normal. Cerebellum is unremarkable. Vascular: No hyperdense vessel or unexpected calcification. Skull: Normal. Negative for fracture or focal lesion. Sinuses/Orbits: Paranasal sinuses are clear. Orbits are unremarkable. Other: Mastoid air cells and middle ear cavities are clear. IMPRESSION: 1. 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 No superimposed acute intracranial hemorrhage. No new abnormal mass effect or midline shift. Electronically Signed   By: Helyn Numbers M.D.   On: 07/23/2022 00:51   MR BRAIN WO CONTRAST  Result Date: 07/22/2022 CLINICAL DATA:  Altered mental status EXAM: MRI HEAD WITHOUT CONTRAST TECHNIQUE: Multiplanar, multiecho pulse sequences of the brain and surrounding structures were obtained without intravenous contrast. COMPARISON:  None Available. FINDINGS: Brain: Multifocal acute ischemia within the left PCA territory, including the occipital lobe, temporal lobe and thalamus. No acute or chronic hemorrhage. There is multifocal hyperintense T2-weighted signal  within the white matter. Parenchymal volume and CSF spaces are normal. The midline structures are normal. Vascular: Major flow voids are preserved. Skull and upper cervical spine: Normal calvarium and skull base. Visualized upper cervical spine and soft tissues are normal. Sinuses/Orbits:No paranasal sinus fluid levels or advanced mucosal thickening. No mastoid or middle ear effusion. Normal orbits. IMPRESSION: Multifocal acute ischemia within the left PCA territory, including the occipital lobe, temporal lobe and thalamus. No hemorrhage or mass effect. Electronically Signed   By: Deatra Robinson M.D.   On: 07/22/2022 20:41   CT HEAD WO CONTRAST ( )  Result Date: 07/22/2022 CLINICAL DATA:  Altered mental status, lethargy EXAM: CT HEAD WITHOUT CONTRAST TECHNIQUE: Contiguous axial images were obtained from the base of the skull through the vertex without intravenous contrast. RADIATION DOSE REDUCTION: This exam was performed according to the departmental dose-optimization program which includes automated exposure control, adjustment of the mA and/or kV according to patient size and/or use of iterative reconstruction technique. COMPARISON:  None Available. FINDINGS: Brain: There are no signs of bleeding within the cranium. There is a 2.6 cm area of low-density in posterior left occipital lobe. There is subcentimeter low-density in the left basal  ganglia. Cortical sulci are prominent. Vascular: Scattered arterial calcifications are seen. Skull: Unremarkable. Sinuses/Orbits: There is mucosal thickening in ethmoid sinus. Other: None. IMPRESSION: There are no signs of bleeding within the cranium. There is no focal mass effect. There is 2.6 cm low-density in the posterior left occipital lobe suggesting possible encephalomalacia from previous infarction. Less likely possibility would be space-occupying lesion. Follow-up MRI may be considered. The subcentimeter low-density in the left basal ganglia suggesting possible old  lacunar infarct. Atrophy. Electronically Signed   By: Ernie Avena M.D.   On: 07/22/2022 14:02   DG Chest Portable 1 View  Result Date: 07/22/2022 CLINICAL DATA:  Shortness of breath EXAM: PORTABLE CHEST 1 VIEW COMPARISON:  10/21/2015 FINDINGS: Cardiac size is within normal limits. There is poor inspiration. There are no signs of pulmonary edema or focal pulmonary consolidation. There is no pleural effusion or pneumothorax. IMPRESSION: There are no signs of pulmonary edema or focal pulmonary consolidation. Electronically Signed   By: Ernie Avena M.D.   On: 07/22/2022 13:56    Medications: Scheduled:   stroke: early stages of recovery book   Does not apply Once   aspirin  81 mg Oral Daily   atorvastatin  40 mg Oral Daily   clopidogrel  75 mg Oral Daily   enoxaparin (LOVENOX) injection  40 mg Subcutaneous Q24H   FLUoxetine  40 mg Oral Daily   insulin aspart  0-15 Units Subcutaneous TID WC   insulin aspart  0-5 Units Subcutaneous QHS   insulin glargine-yfgn  15 Units Subcutaneous QHS   Continuous:  sodium chloride Stopped (07/23/22 2255)   Assessment: 57 year old female presenting with lethargy, nausea and vomiting. Her CBG was over 600 with EMS. Brain imaging reveals multiple acute ischemic infarctions within the left PCA territory. - Exam reveals right facial droop, dysarthria and right hemiplegia. She is essentially unchanged since yesterday's neurology exam.  - Imaging: - MRI brain: Multifocal acute ischemia within the left PCA territory, including the occipital lobe, temporal lobe and thalamus. No acute or chronic hemorrhage. There is multifocal hyperintense T2-weighted signal within the white matter. Parenchymal volume and CSF spaces are normal. The midline structures are normal. Major flow voids are preserved. - CTA of head and neck: Severe/critical stenosis versus occlusion of the proximal left PCA with poor/irregular distal opacification. Severe bilateral intracranial ICA  stenosis. Approximately 70% stenosis of the right ICA origin in the neck. Severe stenosis of the small/non dominant right vertebral artery origin. 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 - Repeat CT head Friday evening after possible worsening noted by RN: No significant change.  - Labs: - Glucose 244, AST/ALT normal. BUN and Cr normal.  - Elevated total cholesterol, low HDL, elevated LDL and triglycerides - CBC normal - HgbA1c markedly elevated at 14.5 - U/A not consistent with UTI - EtOH level < 10 - UDS negative - EKG: Sinus rhythm; Probable left atrial enlargement; Left anterior fascicular block; Anterior infarct, old - TTE: LVEF 60 to 65%. LV with normal function and no regional wall motion abnormalities. Left ventricular diastolic parameters are consistent with Grade I diastolic dysfunction. No mural thrombus or valvular vegetation mentioned in the report.       Recommendations: - Cardiac telemetry - Agree with starting DAPT with Plavix and ASA. Continue both for 21 days, then discontinue Plavix and continue with ASA monotherapy indefinitely.  - Continue atorvastatin.  - BP management per standard protocol. She is out of the permissive HTN time  window.  - PT/OT/Speech  - Frequent neuro checks - NPO until passes stroke swallow screen - Glycemic control - May be a good candidate for Inpatient Rehabilitation.  - No further neurology recommendations.  - Neurohospitalist service will sign off. Please call if there are additional questions.    LOS: 1 day   @Electronically  signed: Dr. Caryl Pina 07/24/2022  12:06 PM

## 2022-07-25 LAB — CULTURE, BLOOD (ROUTINE X 2)
Culture: NO GROWTH
Culture: NO GROWTH

## 2022-07-25 LAB — GLUCOSE, CAPILLARY
Glucose-Capillary: 172 mg/dL — ABNORMAL HIGH (ref 70–99)
Glucose-Capillary: 284 mg/dL — ABNORMAL HIGH (ref 70–99)
Glucose-Capillary: 189 mg/dL — ABNORMAL HIGH (ref 70–99)
Glucose-Capillary: 362 mg/dL — ABNORMAL HIGH (ref 70–99)

## 2022-07-25 MED ORDER — INSULIN GLARGINE-YFGN 100 UNIT/ML ~~LOC~~ SOLN
20.0000 [IU] | Freq: Every day | SUBCUTANEOUS | Status: DC
  Administered 2022-07-25 – 2022-07-26 (×2): 20 [IU] via SUBCUTANEOUS
  Filled 2022-07-25 (×2): qty 0.2

## 2022-07-25 MED ORDER — CHLORHEXIDINE GLUCONATE CLOTH 2 % EX PADS
6.0000 | MEDICATED_PAD | Freq: Every day | CUTANEOUS | Status: DC
  Administered 2022-07-25 – 2022-07-26 (×2): 6 via TOPICAL

## 2022-07-25 NOTE — Progress Notes (Signed)
PROGRESS NOTE    Destiny Palmer  ZOX:[id number] DOB: 10-07-1965 DOA: 07/22/2022 PCP: [facility]    Brief Narrative:   57 y.o. female with medical history significant of diabetes mellitus reportedly nonadherent to diabetic regimen who presents to the ED with chief complaint of altered mentation.  Patient has been lethargic with nausea and vomiting.  Remains alert and oriented x 3.  Apparently CBG was over 600 with EMS.  310 on BMP on arrival.   Apparently had some slurred speech and difficulty walking.  Patient unable to provide much history.  On my evaluation patient is resting comfortably in bed.  She is alert and oriented x 3.  She is a little confused on the location, states we are in [place].  Does have a psychiatric history including adjustment disorder, history of suicide attempt in 2017, PTSD.  Psychiatric regimen is listed on her home medication reconciliation but there are no documented outpatient visits for several years so unclear exactly what she has been taken.   Urinalysis shows significant glucosuria and some evidence of leukocytes.  No fever.  CT head demonstrates evidence of old CVA versus less likely space-occupying lesion.   Laboratory investigation overall unrevealing apart from some hyperglycemia.  Remainder of CMP within normal limits.  Urine drug screen negative.  Chest x-ray negative  5/3: Stroke alert called last night for acute right lower extremity and right upper extremity weakness.  MRI that was ordered by myself did demonstrate large multifocal acute infarct in PCA territory.  Teleneurology consulted.  Started on aspirin Plavix dual antiplatelet therapy.  5/4: no appreciable changes in exam   Assessment & Plan:   Principal Problem:   Acute metabolic encephalopathy Active Problems:   Cerebrovascular accident (CVA) (HCC)  Acute CVA Acute metabolic encephalopathy And now appears that patient's altered mentation was secondary to evolving infarct.   Unknown last known well.  Remains markedly weak on the right Plan: Continue DAPT aspirin and Plavix x 3 weeks followed by aspirin monotherapy indefinitely Continue high intensity statin Telemetry monitoring Out of window for permissive hypertension.  Normotensive blood pressure goal Will need skilled nursing facility versus IPR at time of discharge  Insulin-dependent diabetes mellitus with hyperglycemia Hemoglobin A1c 14.5.  Patient not adherent to prescribed insulin regimen. Plan: Increase Semglee to 20 units nightly Moderate sliding scale Nightly coverage Carb modified diet Diabetes coordinator consult Will need insulin regimen at time of discharge  Essential hypertension Out of window for permissive hypertension.  Normotensive blood pressure goal.  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  Medically ready for discharge   Level of care: Progressive  Consultants:  Neurology  Procedures:  None  Antimicrobials: Rocephin    Subjective: Seen and examined.  Remains markedly weak on the right.  Able to eat this morning.  Objective: Vitals:   07/25/22 0004 07/25/22 0405 07/25/22 0823 07/25/22 1222  BP: (!) 143/82 130/71 137/82 117/72  Pulse: 69 66 75 73  Resp:  17 16 14   Temp: 97.7 F (36.5 C) 97.9 F (36.6 C) 98 F (36.7 C) 98 F (36.7 C)  TempSrc:   Oral Oral  SpO2: 100% 99% 98% 98%  Weight:      Height:        Intake/Output Summary (Last 24 hours) at  07/25/2022 1401 Last data filed at 07/25/2022 1039 Gross per 24 hour  Intake 934.13 ml  Output 550 ml  Net 384.13 ml   Filed Weights   07/22/22 1700  Weight: 68.9 kg    Examination:  General exam: NAD Respiratory system: Lungs clear.   Normal breathing.  Room air Cardiovascular system: S1-S2, RRR, no murmurs, no pedal edema Gastrointestinal system: Soft, NT/ND, normal bowel sounds Central nervous system: Alert and oriented.  RUE/RLE marked deficit Extremities: Markedly decreased power right upper and lower extremities Skin: No rashes, lesions or ulcers Psychiatry: Judgement and insight appear impaired. Mood & affect flattened.     Data Reviewed: I have personally reviewed following labs and imaging studies  CBC: Recent Labs  Lab 07/22/22 1309 07/23/22 0411  WBC 6.9 6.3  NEUTROABS 4.6  --   HGB 17.1* 13.1  HCT 51.0* 39.7  MCV 90.3 90.8  PLT 269 190   Basic Metabolic Panel: Recent Labs  Lab 07/22/22 1548 07/23/22 0411  NA 137 139  K 5.1 3.6  CL 101 102  CO2 27 29  GLUCOSE 310* 244*  BUN 15 15  CREATININE 0.65 0.62  CALCIUM 8.9 8.5*  MG 1.9 1.8  PHOS 3.3 3.0   GFR: Estimated Creatinine Clearance: 72.6 mL/min (by C-G formula based on SCr of 0.62 mg/dL). Liver Function Tests: Recent Labs  Lab 07/22/22 1548 07/23/22 0411  AST 33 23  ALT 22 23  ALKPHOS 84 71  BILITOT 1.5* 0.8  PROT 6.8 6.1*  ALBUMIN 3.2* 2.9*   No results for input(s): "LIPASE", "AMYLASE" in the last 168 hours. No results for input(s): "AMMONIA" in the last 168 hours. Coagulation Profile: No results for input(s): "INR", "PROTIME" in the last 168 hours. Cardiac Enzymes: No results for input(s): "CKTOTAL", "CKMB", "CKMBINDEX", "TROPONINI" in the last 168 hours. BNP (last 3 results) No results for input(s): "PROBNP" in the last 8760 hours. HbA1C: No results for input(s): "HGBA1C" in the last 72 hours.  CBG: Recent Labs  Lab 07/24/22 1133 07/24/22 1605 07/24/22 2033 07/25/22 0824 07/25/22 1222  GLUCAP 201* 290* 281* 172* 284*   Lipid Profile: Recent Labs    07/23/22 0411  CHOL 224*  HDL 30*  LDLCALC 158*  TRIG 181*  CHOLHDL 7.5   Thyroid Function Tests: No results for input(s): "TSH", "T4TOTAL", "FREET4",  "T3FREE", "THYROIDAB" in the last 72 hours. Anemia Panel: No results for input(s): "VITAMINB12", "FOLATE", "FERRITIN", "TIBC", "IRON", "RETICCTPCT" in the last 72 hours. Sepsis Labs: Recent Labs  Lab 07/22/22 1755  LATICACIDVEN 1.4    Recent Results (from the past 240 hour(s))  Urine Culture     Status: Abnormal   Collection Time: 07/22/22  1:07 PM   Specimen: Urine, Clean Catch  Result Value Ref Range Status   Specimen Description   Final    URINE, CLEAN CATCH Performed at [facility], [address], [place], [place] [address]    Special Requests   Final    NONE Performed at [facility], [address] [place], [place] [address]    Culture (A)  Final    40,000 COLONIES/mL STREPTOCOCCUS AGALACTIAE TESTING AGAINST S. AGALACTIAE NOT ROUTINELY PERFORMED DUE TO PREDICTABILITY OF AMP/PEN/VAN SUSCEPTIBILITY. Performed at [facility], [address] [place], [place] [address]    Report Status 07/23/2022 FINAL  Final  Blood culture (routine x 2)     Status: None (Preliminary result)   Collection Time: 07/22/22  5:48 PM   Specimen: BLOOD  Result  Value Ref Range Status   Specimen Description BLOOD RIGHT ANTECUBITAL  Final   Special Requests   Final    BOTTLES DRAWN AEROBIC AND ANAEROBIC Blood Culture adequate volume   Culture   Final    NO GROWTH 3 DAYS Performed at [facility], [address], [place], [place] [address]    Report Status PENDING  Incomplete  Blood culture (routine x 2)     Status: None (Preliminary result)   Collection Time: 07/22/22  5:55 PM   Specimen: BLOOD  Result Value Ref Range Status   Specimen Description BLOOD LEFT ANTECUBITAL  Final   Special Requests   Final    BOTTLES DRAWN AEROBIC AND ANAEROBIC Blood Culture adequate volume   Culture   Final    NO GROWTH 3 DAYS Performed at [facility], [address] [place], [place] [address]    Report Status PENDING  Incomplete         Radiology  Studies: CT HEAD WO CONTRAST ( )  Result Date: 07/23/2022 CLINICAL DATA:  Stroke follow-up. EXAM: CT HEAD WITHOUT CONTRAST TECHNIQUE: Contiguous axial images were obtained from the base of the skull through the vertex without intravenous contrast. RADIATION DOSE REDUCTION: This exam was performed according to the departmental dose-optimization program which includes automated exposure control, adjustment of the mA and/or kV according to patient size and/or use of iterative reconstruction technique. COMPARISON:  07/23/2022 and 07/22/2022. FINDINGS: Brain: No new infarcts. Hypoattenuation reflecting the recent infarcts noted in the left occipital lobe left middle cerebral peduncle and adjacent genu of the internal capsule with a small area in the subcortical left frontal lobe. Small focus noted along the anterolateral thalamus. These are all stable from the most recent prior CT, 07/23/2022. No intracranial hemorrhage. Ventricles normal in size and configuration. No extra-axial masses or abnormal fluid collections. Vascular: No hyperdense vessel or unexpected calcification. Skull: Normal. Negative for fracture or focal lesion. Sinuses/Orbits: Globes and orbits are unremarkable. Mild ethmoid sinus mucosal thickening. Other: None. IMPRESSION: 1. No significant change when compared to the most recent prior head CT. 2. Multiple infarcts as detailed. No new infarcts. No intracranial hemorrhage. Electronically Signed   By: Amie Portland M.D.   On: 07/23/2022 17:32        Scheduled Meds:   stroke: early stages of recovery book   Does not apply Once   aspirin  81 mg Oral Daily   atorvastatin  40 mg Oral Daily   Chlorhexidine Gluconate Cloth  6 each Topical Q0600   clopidogrel  75 mg Oral Daily   enoxaparin (LOVENOX) injection  40 mg Subcutaneous Q24H   FLUoxetine  40 mg Oral Daily   insulin aspart  0-15 Units Subcutaneous TID WC   insulin aspart  0-5 Units Subcutaneous QHS   insulin glargine-yfgn  20 Units  Subcutaneous QHS   Continuous Infusions:     LOS: 2 days      Tresa Moore, MD [facility]   If 7PM-7AM, please contact night-coverage  07/25/2022, 2:01 PM

## 2022-07-25 NOTE — Progress Notes (Signed)
Speech Language Pathology Treatment: Dysphagia  Patient Details Name: Destiny Palmer MRN: [id number] DOB: [birth year] Today's Date: 07/25/2022 Time: 4098-1191 SLP Time Calculation (min) (ACUTE ONLY): 15 min  Assessment / Plan / Recommendation Clinical Impression  Pt seen for clinical swallowing re-evaluation. Pt alert and cooperative. Flat affect. Aphasia and apraxia noted. Speech is halting, paraphasic, and perseverative at times.   Pt given trials of solid, puree, nectar-thick, and thin liquids. Pt demonstrated a functional oral swallow. Pharyngeal swallow appeared [facility]; however, pt is at increased risk for silent aspiration given recent stroke.   Recommend diet upgrade to regular diet with thin liquids with safe swallowing strategies/aspiration precautions as outlined below.   MBSS tentatively scheduled for next date given pt's clinical risk factors.    HPI HPI: Pt is an 57 y.o. female with a PMHx of DM and HTN who presented to the ED yesterday with lethargy, nausea and vomiting. Her CBG was over 600 with EMS. Family noted her to have some slurred speech and difficulty walking. She had recently stopped taking her metformin. She did not fit criteria for DKA but HHS was on the DDx. She was treated with IVF and glucose trended downward. UDS was negative. U/A showed significant glucosuria.      CT head revealed multifocal subacute infarcts within the left occipital lobe, thalamus, genu of the internal capsule, and cerebral peduncle.      MRI was then obtained, confirming multiple subacute ischemic infarctions in the left PCA territory.      About 1 hour after completion of MRI, she exhibited neurological worsening and a Code Stroke was called. Teleneurology evaluated the patient with exam revealing an NIHSS of 10, which was worse than her exams documented earlier. As there was no clear LKN, she was not a candidate for IV thrombolysis or mechanical thrombectomy. DAPT was initiated along with IVF.       SLP Plan  MBS (tentatively scheduled for 5/6)      Recommendations for follow up therapy are one component of a multi-disciplinary discharge planning process, led by the attending physician.  Recommendations may be updated based on patient status, additional functional criteria and insurance authorization.    Recommendations  Diet recommendations: Regular;Thin liquid Medication Administration:  (as tolerated) Supervision: Patient able to self feed (set up) Compensations: Slow rate;Small sips/bites Postural Changes and/or Swallow Maneuvers: Seated upright 90 degrees;Upright 30-60 min after meal;Out of bed for meals                  Oral care BID;Oral care before and after PO;Staff/trained caregiver to provide oral care   Frequent or constant Supervision/Assistance Dysphagia, oropharyngeal phase (R13.12)     MBS (tentatively scheduled for 5/6)    [name], M.S., CCC-SLP Speech-Language Pathologist [facility] [phone] (ASCOM)  Woodroe Chen  07/25/2022, 10:14 AM

## 2022-07-25 NOTE — Evaluation (Signed)
Speech Language Pathology Evaluation Patient Details Name: Destiny Palmer MRN: [id number] DOB: 11-11-1965 Today's Date: 07/25/2022 Time: 3086-5784 SLP Time Calculation (min) (ACUTE ONLY): 24 min  Problem List:  Patient Active Problem List   Diagnosis Date Noted   Cerebrovascular accident (CVA) (HCC) 07/23/2022   Acute metabolic encephalopathy 07/22/2022   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   Suicide attempt (HCC) 10/22/2015   PTSD (post-traumatic stress disorder) 10/22/2015   Diabetes mellitus without complication (HCC) 10/22/2015   Past Medical History:  Past Medical History:  Diagnosis Date   Diabetes mellitus without complication (HCC)    Hypertension    Past Surgical History:  Past Surgical History:  Procedure Laterality Date   NO PAST SURGERIES     SHOULDER CLOSED REDUCTION Left 03/30/2016   Procedure: CLOSED REDUCTION SHOULDER;  Surgeon: Donato Heinz, MD;  Location: [facility] ORS;  Service: Orthopedics;  Laterality: Left;   HPI:  Pt is an 56 y.o. female with a PMHx of DM and HTN who presented to the ED yesterday with lethargy, nausea and vomiting. Her CBG was over 600 with EMS. Family noted her to have some slurred speech and difficulty walking. She had recently stopped taking her metformin. She did not fit criteria for DKA but HHS was on the DDx. She was treated with IVF and glucose trended downward. UDS was negative. U/A showed significant glucosuria.      CT head revealed multifocal subacute infarcts within the left occipital lobe, thalamus, genu of the internal capsule, and cerebral peduncle.      MRI was then obtained, confirming multiple subacute ischemic infarctions in the left PCA territory.      About 1 hour after completion of MRI, she exhibited neurological worsening and a Code Stroke was called. Teleneurology evaluated the patient with exam revealing an NIHSS of 10, which was worse than her exams documented earlier. As there was  no clear LKN, she was not a candidate for IV thrombolysis or mechanical thrombectomy. DAPT was initiated along with IVF.   Assessment / Plan / Recommendation Clinical Impression  Pt seen for speech/language evaluation. Assessment completed via informal means and portions of the Western Aphasia Battery Revised - Bedside Record Form. 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 Pt's speech is often agrammatic, effortful, and halting. Speech is marked by few propositional sentences, semantic/phonemic paraphasias, and verbal perseveration. Pt with impaired confrontation naming, divergent naming, and repetition. ?pt's awareness of deficits. Pt with reduced auditory comprehension affecting comprehension of both simple and complex information. Minimal articulatory imprecision noted with limited-to-no impact on speech intelligiblity. Assessment limited by pt's attention (visual, sustain) and ?visual deficits (pt reporting double vision). Based on today's assessment, anticipate need for intensive SLP services post-acute as well as frequent/constant supervision. SLP to continue to f/u while pt in house for above mentioned deficits and dysphagia management.    SLP Assessment  SLP Recommendation/Assessment: Patient needs continued Speech Lanaguage Pathology Services SLP Visit Diagnosis: Aphasia (R47.01);Apraxia (R48.2);Cognitive communication deficit (R41.841)    Recommendations for follow up therapy are one component of a multi-disciplinary discharge planning process, led by the attending physician.  Recommendations may be updated based on patient status, additional functional criteria and insurance authorization.    Follow Up Recommendations  Acute inpatient rehab (3hours/day)    Assistance Recommended at Discharge  Frequent or constant Supervision/Assistance  Functional Status Assessment Patient has had a recent  decline  in their functional status and demonstrates the ability to make significant improvements in function in a reasonable and predictable amount of time.  Frequency and Duration min 3x week  2 weeks      SLP Evaluation Cognition  Overall Cognitive Status: Difficult to assess (due to aphasia) Arousal/Alertness: Awake/alert Orientation Level: Oriented to person;Oriented to place;Disoriented to time;Disoriented to situation       Comprehension  Auditory Comprehension Overall Auditory Comprehension: Impaired Yes/No Questions: Impaired Basic Immediate Environment Questions: 75-100% accurate Complex Questions: 25-49% accurate Commands: Impaired Two Step Basic Commands: 25-49% accurate Complex Commands: 25-49% accurate Conversation: Simple Interfering Components: Attention;Visual impairments (reporting "double vision") EffectiveTechniques: Extra processing time;Slowed speech;Visual/Gestural cues Visual Recognition/Discrimination Discrimination: Not tested Reading Comprehension Reading Status: Not tested    Expression Expression Primary Mode of Expression: Verbal Verbal Expression Overall Verbal Expression: Impaired Initiation: Impaired Automatic Speech: Name;Day of week (1-10 WFL; min/mod verbal cueing to initiate DOW - perseveration noted) Level of Generative/Spontaneous Verbalization: Word;Phrase Repetition: Impaired Level of Impairment: Sentence level Naming: Impairment Confrontation: Impaired (2/10 objects; 10/10 with cueing) Divergent:  (x1 animal in 60s; perseveration; paraphasias) Verbal Errors: Semantic paraphasias;Phonemic paraphasias;Perseveration Pragmatics: Impairment Impairments: Abnormal affect;Topic maintenance Interfering Components: Attention Effective Techniques: Semantic cues;Sentence completion;Phonemic cues   Oral / Motor  Oral Motor/Sensory Function Overall Oral Motor/Sensory Function: Moderate impairment Facial ROM: Reduced right;Suspected CN VII  (facial) dysfunction Facial Symmetry: Abnormal symmetry right;Suspected CN VII (facial) dysfunction Facial Strength: Reduced right;Suspected CN VII (facial) dysfunction Facial Sensation: Reduced right;Suspected CN V (Trigeminal) dysfunction Lingual ROM: Reduced right;Suspected CN XII (hypoglossal) dysfunction Lingual Symmetry: Abnormal symmetry right;Suspected CN XII (hypoglossal) dysfunction Lingual Strength: Reduced Velum: Impaired right;Suspected CN X (Vagus) dysfunction Mandible: Within Functional Limits Motor Speech Overall Motor Speech: Impaired Respiration: Within functional limits Phonation: Normal Resonance: Within functional limits Articulation: Impaired Level of Impairment:  (minimal articulatory imprecision noted across all levels) Intelligibility: Intelligible Motor Planning: Impaired Level of Impairment:  (all levels) Motor Speech Errors: Inconsistent (halting, effortful)           [name], M.S., CCC-SLP Speech-Language Pathologist [facility] [phone] Arnette Felts)  Woodroe Chen 07/25/2022, 10:34 AM

## 2022-07-26 ENCOUNTER — Inpatient Hospital Stay: Payer: Self-pay

## 2022-07-26 LAB — CULTURE, BLOOD (ROUTINE X 2)

## 2022-07-26 LAB — GLUCOSE, CAPILLARY
Glucose-Capillary: 192 mg/dL — ABNORMAL HIGH (ref 70–99)
Glucose-Capillary: 343 mg/dL — ABNORMAL HIGH (ref 70–99)
Glucose-Capillary: 250 mg/dL — ABNORMAL HIGH (ref 70–99)
Glucose-Capillary: 270 mg/dL — ABNORMAL HIGH (ref 70–99)

## 2022-07-26 NOTE — Progress Notes (Signed)
Occupational Therapy Treatment Patient Details Name: Destiny Palmer MRN: [id number] DOB: 12/02/1965 Today's Date: 07/26/2022   History of present illness Destiny Palmer is a 56yoF who comes to [facility] on 5/2 with AMS, BG in 600s on arrival. Code stoke called later in evening after acute onset motor deficits RUE. PMH: DM, HTN, SI, Lt shoulder dislocation s/p closed reduction 2018. MRI brain revealing "multifocal acute ischemia within the left PCA territory, including the occipital lobe, temporal lobe and thalamus."  At baseline pt denies any impairment of gait or balance, not use of AD, reports to live with mother whom she provides caregiver assistance.   OT comments  Destiny Palmer was seen for OT treatment on this date. Upon arrival to room pt reclined in bed, agreeable to tx. Pt is impulsive with movements t/o session. Educated on joint protection strategies, including RUE positioning and wearing sling for transfers. Pt requires MOD A sup<>sit. MAX A bed<>chair pivot t/f. MOD A don sling seated EOB.   NMR for Upper trunk control seated EOB: weight shifting L + R with cues to return to midline, cues to WB through R hand and cues to activate RUE with trace activation noted. NMR static standing with R knee block and LUE hand held support: weight shifting L+R with MOD A to maintain balance when weight shifting towards R side. MOD A for side steps with LLE and R knee block. Pt making good progress toward goals, will continue to follow POC. Discharge recommendation remains appropriate.     Recommendations for follow up therapy are one component of a multi-disciplinary discharge planning process, led by the attending physician.  Recommendations may be updated based on patient status, additional functional criteria and insurance authorization.    Assistance Recommended at Discharge Frequent or constant Supervision/Assistance  Patient can return home with the following  Two people to help with  bathing/dressing/bathroom;A lot of help with walking and/or transfers;Help with stairs or ramp for entrance   Equipment Recommendations  Other (comment)    Recommendations for Other Services      Precautions / Restrictions Precautions Precautions: Fall Precaution Comments: hemi RUE Required Braces or Orthoses: Sling (for transfers only) Restrictions Weight Bearing Restrictions: No       Mobility Bed Mobility Overal bed mobility: Needs Assistance Bed Mobility: Supine to Sit, Sit to Supine     Supine to sit: Mod assist Sit to supine: Mod assist   General bed mobility comments: needs assist for R hemibody. Able to reach railing using L hand.    Transfers Overall transfer level: Needs assistance Equipment used: 1 person hand held assist Transfers: Bed to chair/wheelchair/BSC   Stand pivot transfers: Max assist               Balance Overall balance assessment: Needs assistance Sitting-balance support: Single extremity supported, Feet unsupported Sitting balance-Leahy Scale: Fair     Standing balance support: Single extremity supported Standing balance-Leahy Scale: Fair                             ADL either performed or assessed with clinical judgement   ADL Overall ADL's : Needs assistance/impaired                                       General ADL Comments: MAX A simulated BSC t/f. MOD A don sling seated EOB.  Cognition Arousal/Alertness: Awake/alert Behavior During Therapy: WFL for tasks assessed/performed, Impulsive Overall Cognitive Status: Within Functional Limits for tasks assessed                                 General Comments: cues for safety        Exercises Exercises: Other exercises Other Exercises Other Exercises: NMR for Upper trunk control seated EOB- weight shifting L + R and returning to midline, cues to WB through R hand and activate RUE with trace activation noted. NMR static standing  with R knee block and LUE support: weight shifting L+R with MOD A to maintain balance when weight shifting towards R side. MOD A side steps with LLE and R knee block            Pertinent Vitals/ Pain       Pain Assessment Pain Assessment: No/denies pain   Frequency  Min 3X/week        Progress Toward Goals  OT Goals(current goals can now be found in the care plan section)  Progress towards OT goals: Progressing toward goals  Acute Rehab OT Goals Patient Stated Goal: to walk OT Goal Formulation: With patient Time For Goal Achievement: 08/06/22 Potential to Achieve Goals: Good ADL Goals Pt Will Perform Grooming: with min assist;sitting Pt Will Perform Upper Body Dressing: with supervision;sitting Pt Will Transfer to Toilet: with min assist;stand pivot transfer;bedside commode Pt/caregiver will Perform Home Exercise Program: Increased strength;Right Upper extremity;Independently  Plan Frequency remains appropriate;Discharge plan remains appropriate    Co-evaluation                 AM-PAC OT "6 Clicks" Daily Activity     Outcome Measure   Help from another person eating meals?: A Little Help from another person taking care of personal grooming?: A Lot Help from another person toileting, which includes using toliet, bedpan, or urinal?: A Lot Help from another person bathing (including washing, rinsing, drying)?: A Lot Help from another person to put on and taking off regular upper body clothing?: A Lot Help from another person to put on and taking off regular lower body clothing?: A Lot 6 Click Score: 13    End of Session Equipment Utilized During Treatment: Gait belt  OT Visit Diagnosis: Other abnormalities of gait and mobility (R26.89);Muscle weakness (generalized) (M62.81)   Activity Tolerance Patient tolerated treatment well   Patient Left in bed;with call bell/phone within reach;with bed alarm set;with nursing/sitter in room   Nurse Communication  Mobility status        Time: 1610-9604 OT Time Calculation (min): 31 min  Charges: OT General Charges $OT Visit: 1 Visit OT Treatments $Neuromuscular Re-education: 23-37 mins  Kathie Dike, M.S. OTR/L  07/26/22, 4:37 PM  ascom [phone]

## 2022-07-26 NOTE — Procedures (Signed)
Modified Barium Swallow Study  Patient Details  Name: Destiny Palmer MRN: [id number] Date of Birth: [birth year]  Today's Date: 07/26/2022  Modified Barium Swallow completed.  Full report located under Chart Review in the Imaging Section.  History of Present Illness Pt is an 57 y.o. female with a PMHx of DM and HTN who presented to the ED yesterday with lethargy, nausea and vomiting. Her CBG was over 600 with EMS. Family noted her to have some slurred speech and difficulty walking. She had recently stopped taking her metformin. She did not fit criteria for DKA but HHS was on the DDx. She was treated with IVF and glucose trended downward. UDS was negative. U/A showed significant glucosuria.      CT head revealed multifocal subacute infarcts within the left occipital lobe, thalamus, genu of the internal capsule, and cerebral peduncle.      MRI was then obtained, confirming multiple subacute ischemic infarctions in the left PCA territory.      About 1 hour after completion of MRI, she exhibited neurological worsening and a Code Stroke was called. Teleneurology evaluated the patient with exam revealing an NIHSS of 10, which was worse than her exams documented earlier. As there was no clear LKN, she was not a candidate for IV thrombolysis or mechanical thrombectomy. DAPT was initiated along with IVF.   Clinical Impression Pt presents with a mild oropharyngeal dysphagia significant reduced posterior lingual control resulting in pre-swallow loss of boluses to the level of the pyriform sinus as well as pre-swallow transient laryngeal penetration of thin liquids (via teaspoon, cup, straw) and solids and pre-swallow transient aspiration of thin liquids during  sequential straw sips. Cued cough was ineffective in clearing material from upper airway as it was inconsistently weak and difficult to perform secondary to apraxia. Pt benefited from cues to take single sips of thin liquids via straw. Pt educated re:  results, recommendations, and SLP POC. Pt shown videofluoroscopic images for education. Pt verbalized understanding; however, reinforcement of content may be needed. RN also made aware of results, recommendations, and SLP POC. Recommend continuation of current diet with safe swallowing strategies/aspiration precautions as outlined below. SLP to f/u x1 for diet tolerance. SLP to continue established POC for speech/language tx.  Factors that may increase risk of adverse event in presence of aspiration Rubye Oaks & Clearance Coots 2021):    Swallow Evaluation Recommendations Recommendations: PO diet PO Diet Recommendation: Regular;Thin liquids (Level 0) Medication Administration: Whole meds with puree (vs crushed) Supervision: Patient able to self-feed;Intermittent supervision/cueing for swallowing strategies (set up and initial cueing for safe swallowing strategies) Swallowing strategies  : Minimize environmental distractions;Slow rate;Small bites/sips Postural changes: Position pt fully upright for meals;Stay upright 30-60 min after meals Oral care recommendations: Oral care QID (4x/day)     [name], M.S., CCC-SLP Speech-Language Pathologist [facility] [phone] Arnette Felts)  Woodroe Chen 07/26/2022,12:03 PM

## 2022-07-26 NOTE — Progress Notes (Signed)
Physical Therapy Treatment Patient Details Name: Destiny Palmer MRN: [id number] DOB: Mar 27, 1965 Today's Date: 07/26/2022   History of Present Illness Destiny Palmer is a 56yoF who comes [facility] on 5/[facility] with AMS, BG in 600s on arrival. Code stoke called later in evening after acute onset motor deficits RUE. PMH: DM, HTN, SI, Lt shoulder dislocation s/p closed reduction 2018. MRI brain revealing "multifocal acute ischemia within the left PCA territory, including the occipital lobe, temporal lobe and thalamus."  At baseline pt denies any impairment of gait or balance, not use of AD, reports to live with mother whom she provides caregiver assistance.    PT Comments    Pt is making good progress towards goals and is asking for food tray upon arrival. Soon after PT entered room, breakfast tray arrived with pt eager to sit in recliner for eating. Assisted pt for SPT from bed->chair with max assist. Poor function of R hemibody noted with inability to grip or Wb on R LE. Does follow commands well, although has word finding difficulty. When asked what color the "help" button was she responded "red" after several incorrect responses. R UE elevated once in recliner, however would benefit from sling for joint management. Will continue to progress as able.   Recommendations for follow up therapy are one component of a multi-disciplinary discharge planning process, led by the attending physician.  Recommendations may be updated based on patient status, additional functional criteria and insurance authorization.  Follow Up Recommendations  Can patient physically be transported by private vehicle: No    Assistance Recommended at Discharge Intermittent Supervision/Assistance  Patient can return home with the following Two people to help with walking and/or transfers;Two people to help with bathing/dressing/bathroom;Help with stairs or ramp for entrance;Assist for transportation   Equipment Recommendations  None  recommended by PT    Recommendations for Other Services       Precautions / Restrictions Precautions Precautions: Fall Precaution Comments: hemi RUE Restrictions Weight Bearing Restrictions: No     Mobility  Bed Mobility Overal bed mobility: Needs Assistance Bed Mobility: Supine to Sit, Sit to Supine     Supine to sit: Mod assist     General bed mobility comments: needs assist for R hemibody. Able to reach railing using L hand. Once seated at EOB, upright posture with supervision given.    Transfers Overall transfer level: Needs assistance Equipment used: 1 person hand held assist Transfers: Bed to chair/wheelchair/BSC   Stand pivot transfers: Max assist         General transfer comment: able to transfer to L side with full Wbing on L LE. Unable to utilize R hemi body. Needed assist for repositioning in chair once seated    Ambulation/Gait               General Gait Details: unable at this time   Stairs             Wheelchair Mobility    Modified Rankin (Stroke Patients Only)       Balance Overall balance assessment: Needs assistance Sitting-balance support: Single extremity supported, Feet unsupported Sitting balance-Leahy Scale: Fair     Standing balance support: Single extremity supported Standing balance-Leahy Scale: Zero                              Cognition Arousal/Alertness: Awake/alert Behavior During Therapy: WFL for tasks assessed/performed Overall Cognitive Status: Within Functional Limits for tasks assessed  General Comments: able to answer questions with short response. Appears A&O, has trouble with word finding        Exercises Other Exercises Other Exercises: assisted with seated ther-ex with no activation on R side despite cues. Attempted SLRs, hip abd/add, and AP. Other Exercises: Assisted with meal tray set up including cutting up food, opening food items.  Appears to have R side neglect with inability to focus on food on R side of tray. Required cues for attention and bite size. Called CNA at end of session to resume supervision for meals    General Comments        Pertinent Vitals/Pain Pain Assessment Pain Assessment: No/denies pain    Home Living                          Prior Function            PT Goals (current goals can now be found in the care plan section) Acute Rehab PT Goals Patient Stated Goal: to get up to the chair PT Goal Formulation: With patient Progress towards PT goals: Progressing toward goals    Frequency    Min 4X/week      PT Plan Current plan remains appropriate    Co-evaluation              AM-PAC PT "6 Clicks" Mobility   Outcome Measure  Help needed turning from your back to your side while in a flat bed without using bedrails?: A Lot Help needed moving from lying on your back to sitting on the side of a flat bed without using bedrails?: A Lot Help needed moving to and from a bed to a chair (including a wheelchair)?: A Lot Help needed standing up from a chair using your arms (e.g., wheelchair or bedside chair)?: A Lot Help needed to walk in hospital room?: Total Help needed climbing 3-5 steps with a railing? : Total 6 Click Score: 10    End of Session Equipment Utilized During Treatment: Gait belt Activity Tolerance: Patient tolerated treatment well;No increased pain Patient left: in chair;with chair alarm set Nurse Communication: Mobility status PT Visit Diagnosis: Other abnormalities of gait and mobility (R26.89);Unsteadiness on feet (R26.81);Difficulty in walking, not elsewhere classified (R26.2);Muscle weakness (generalized) (M62.81);Other symptoms and signs involving the nervous system (R29.898);Hemiplegia and hemiparesis Hemiplegia - Right/Left: Right Hemiplegia - dominant/non-dominant: Dominant Hemiplegia - caused by: Cerebral infarction     Time: 4098-1191 PT  Time Calculation (min) (ACUTE ONLY): 22 min  Charges:  $Therapeutic Activity: 8-22 mins                     Destiny Palmer, PT, DPT, GCS [phone]    Destiny Palmer 07/26/2022, 11:00 AM

## 2022-07-26 NOTE — Inpatient Diabetes Management (Signed)
Inpatient Diabetes Program Recommendations  AACE/ADA: New Consensus Statement on Inpatient Glycemic Control   Target Ranges:  Prepandial:   less than 140 mg/dL      Peak postprandial:   less than 180 mg/dL (1-2 hours)      Critically ill patients:  140 - 180 mg/dL    Latest Reference Range & Units 07/26/22 08:30 07/26/22 11:50  Glucose-Capillary 70 - 99 mg/dL 409 (H) 811 (H)    Latest Reference Range & Units 07/25/22 08:24 07/25/22 12:22 07/25/22 17:14 07/25/22 20:43  Glucose-Capillary 70 - 99 mg/dL 914 (H) 782 (H) 956 (H) 362 (H)    Latest Reference Range & Units 07/22/22 13:09  Hemoglobin A1C 4.8 - 5.6 % 14.5 (H)   Review of Glycemic Control  Diabetes history: DM2 Outpatient Diabetes medications: Lantus 25 units daily, Novolog 3 units TID with meals, Novolog 0-15 units TID with meals Current orders for Inpatient glycemic control: Semglee 20 units QHS, Novolog 0-15 units TID with meals, Novolog 0-5 units QHS  Inpatient Diabetes Program Recommendations:    Insulin: Please consider ordering Novolog 4 units TID with meals for meal coverage if patient eats at least 50% of meals.  HbgA1C: A1C 14.5% on 07/22/22 indicating an average glucose of 369 mg/dl over the past 2-3 months.  NOTE: Spoke with patient at bedside. Patient lying in bed, alert. Asked patient about her DM medications and she stated "No". She is not able to tell me what DM medications she is suppose to be taking at home. She can not verbalize or confirm the names of the insulin when asked. She answered most questions with "No" after delayed response. She states "No" that she does not have a glucometer at home or any insulin at home. When asked if anyone lives with her she stated "nephew and niece" but does not know their names. Anticipate patient will require assistance with DM management. Noted patient likely discharging to SNF or CIR. Will follow along.  Thanks, Orlando Penner, RN, MSN, CDCES Diabetes Coordinator Inpatient  Diabetes Program [phone] (Team Pager from 8am to 5pm)

## 2022-07-26 NOTE — Progress Notes (Signed)
PROGRESS NOTE    Destiny Palmer  ZOX:[id number] DOB: 02/27/1966 DOA: 07/22/2022 PCP: [facility]    Brief Narrative:   57 y.o. female with medical history significant of diabetes mellitus reportedly nonadherent to diabetic regimen who presents to the ED with chief complaint of altered mentation.  Patient has been lethargic with nausea and vomiting.  Remains alert and oriented x 3.  Apparently CBG was over 600 with EMS.  310 on BMP on arrival.   Apparently had some slurred speech and difficulty walking.  Patient unable to provide much history.  On my evaluation patient is resting comfortably in bed.  She is alert and oriented x 3.  She is a little confused on the location, states we are in [place].  Does have a psychiatric history including adjustment disorder, history of suicide attempt in 2017, PTSD.  Psychiatric regimen is listed on her home medication reconciliation but there are no documented outpatient visits for several years so unclear exactly what she has been taken.   Urinalysis shows significant glucosuria and some evidence of leukocytes.  No fever.  CT head demonstrates evidence of old CVA versus less likely space-occupying lesion.   Laboratory investigation overall unrevealing apart from some hyperglycemia.  Remainder of CMP within normal limits.  Urine drug screen negative.  Chest x-ray negative  5/3: Stroke alert called last night for acute right lower extremity and right upper extremity weakness.  MRI that was ordered by myself did demonstrate large multifocal acute infarct in PCA territory.  Teleneurology consulted.  Started on aspirin Plavix dual antiplatelet therapy.  5/4: no appreciable changes in exam   Assessment & Plan:   Principal Problem:   Acute metabolic encephalopathy Active Problems:   Cerebrovascular accident (CVA) (HCC)  Acute CVA Acute metabolic encephalopathy And now appears that patient's altered mentation was secondary to evolving infarct.   Unknown last known well.  Remains markedly weak on the right Plan: Continue DAPT aspirin and Plavix x 3 weeks followed by aspirin monotherapy indefinitely Continue high intensity statin Telemetry monitoring Normotensive blood pressure goal Will need skilled nursing facility versus IPR at time of discharge  Insulin-dependent diabetes mellitus with hyperglycemia Hemoglobin A1c 14.5.  Patient not adherent to prescribed insulin regimen. Plan: Continue Semglee 20 units nightly Moderate sliding scale Nightly coverage Carb modified diet Diabetes coordinator consult Will need insulin regimen at time of discharge  Essential hypertension Out of window for permissive hypertension.  Normotensive blood pressure goal.  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  Medically ready for discharge   Level of care: Progressive  Consultants:  Neurology  Procedures:  None  Antimicrobials: Rocephin    Subjective: Seen and examined.  Objective: Vitals:   07/26/22 0005 07/26/22 0358 07/26/22 0829 07/26/22 1147  BP: 128/77 139/85 137/82 118/72  Pulse: 71 68 66 66  Resp: 16 17 16 16   Temp: 98.6 F (37 C) 97.8 F (36.6 C) 97.9 F (36.6 C) 98.7 F (37.1 C)  TempSrc: Oral Oral Oral   SpO2: 98% 100% 98% 98%  Weight:      Height:        Intake/Output Summary (Last 24 hours) at 07/26/2022 1401 Last data filed at 07/26/2022 0010 Gross per 24 hour  Intake 480 ml  Output 1650 ml  Net -  1170 ml   Filed Weights   07/22/22 1700  Weight: 68.9 kg    Examination:  General exam: No acute distress Respiratory system: Lungs clear.  Normal breathing.  Room air Cardiovascular system: S1-S2, RRR, no murmurs, no pedal  edema Gastrointestinal system: Soft, NT/ND, normal bowel sounds Central nervous system: Alert and oriented.  RUE/RLE marked deficit Extremities: Markedly decreased power right upper and lower extremities Skin: No rashes, lesions or ulcers Psychiatry: Judgement and insight appear impaired. Mood & affect flattened.     Data Reviewed: I have personally reviewed following labs and imaging studies  CBC: Recent Labs  Lab 07/22/22 1309 07/23/22 0411  WBC 6.9 6.3  NEUTROABS 4.6  --   HGB 17.1* 13.1  HCT 51.0* 39.7  MCV 90.3 90.8  PLT 269 190   Basic Metabolic Panel: Recent Labs  Lab 07/22/22 1548 07/23/22 0411  NA 137 139  K 5.1 3.6  CL 101 102  CO2 27 29  GLUCOSE 310* 244*  BUN 15 15  CREATININE 0.65 0.62  CALCIUM 8.9 8.5*  MG 1.9 1.8  PHOS 3.3 3.0   GFR: Estimated Creatinine Clearance: 72.6 mL/min (by C-G formula based on SCr of 0.62 mg/dL). Liver Function Tests: Recent Labs  Lab 07/22/22 1548 07/23/22 0411  AST 33 23  ALT 22 23  ALKPHOS 84 71  BILITOT 1.5* 0.8  PROT 6.8 6.1*  ALBUMIN 3.2* 2.9*   No results for input(s): "LIPASE", "AMYLASE" in the last 168 hours. No results for input(s): "AMMONIA" in the last 168 hours. Coagulation Profile: No results for input(s): "INR", "PROTIME" in the last 168 hours. Cardiac Enzymes: No results for input(s): "CKTOTAL", "CKMB", "CKMBINDEX", "TROPONINI" in the last 168 hours. BNP (last 3 results) No results for input(s): "PROBNP" in the last 8760 hours. HbA1C: No results for input(s): "HGBA1C" in the last 72 hours.  CBG: Recent Labs  Lab 07/25/22 1222 07/25/22 1714 07/25/22 2043 07/26/22 0830 07/26/22 1150  GLUCAP 284* 189* 362* 192* 343*   Lipid Profile: No results for input(s): "CHOL", "HDL", "LDLCALC", "TRIG", "CHOLHDL", "LDLDIRECT" in the last 72 hours.  Thyroid Function Tests: No results for input(s): "TSH", "T4TOTAL", "FREET4", "T3FREE", "THYROIDAB" in the last 72 hours. Anemia Panel: No results for  input(s): "VITAMINB12", "FOLATE", "FERRITIN", "TIBC", "IRON", "RETICCTPCT" in the last 72 hours. Sepsis Labs: Recent Labs  Lab 07/22/22 1755  LATICACIDVEN 1.4    Recent Results (from the past 240 hour(s))  Urine Culture     Status: Abnormal   Collection Time: 07/22/22  1:07 PM   Specimen: Urine, Clean Catch  Result Value Ref Range Status   Specimen Description   Final    URINE, CLEAN CATCH Performed at [facility], [address], [place], [place] [address]    Special Requests   Final    NONE Performed at [facility], [address], [place], [place] [address]    Culture (A)  Final    40,000 COLONIES/mL STREPTOCOCCUS AGALACTIAE TESTING AGAINST S. AGALACTIAE NOT ROUTINELY PERFORMED DUE TO PREDICTABILITY OF AMP/PEN/VAN SUSCEPTIBILITY. Performed at [facility], [address], [place], [place] [address]    Report Status 07/23/2022 FINAL  Final  Blood culture (routine x 2)     Status: None (Preliminary result)   Collection Time: 07/22/22  5:48 PM   Specimen: BLOOD  Result Value Ref Range Status   Specimen Description BLOOD RIGHT ANTECUBITAL  Final   Special Requests   Final    BOTTLES DRAWN AEROBIC AND ANAEROBIC  Blood Culture adequate volume   Culture   Final    NO GROWTH 4 DAYS Performed at [facility], [address] [place], [place] [address]    Report Status PENDING  Incomplete  Blood culture (routine x 2)     Status: None (Preliminary result)   Collection Time: 07/22/22  5:55 PM   Specimen: BLOOD  Result Value Ref Range Status   Specimen Description BLOOD LEFT ANTECUBITAL  Final   Special Requests   Final    BOTTLES DRAWN AEROBIC AND ANAEROBIC Blood Culture adequate volume   Culture   Final    NO GROWTH 4 DAYS Performed at [facility], [address] [place], [place] [address]    Report Status PENDING  Incomplete         Radiology Studies: DG Swallowing Func-Speech Pathology  Result Date: 07/26/2022 Table  formatting from the original result was not included. Modified Barium Swallow Study Patient Details Name: Destiny Palmer MRN: [id number] Date of Birth: 1965/06/04 Today's Date: 07/26/2022 HPI/PMH: HPI: Pt is an 57 y.o. female with a PMHx of DM and HTN who presented to the ED yesterday with lethargy, nausea and vomiting. Her CBG was over 600 with EMS. Family noted her to have some slurred speech and difficulty walking. She had recently stopped taking her metformin. She did not fit criteria for DKA but HHS was on the DDx. She was treated with IVF and glucose trended downward. UDS was negative. U/A showed significant glucosuria.      CT head revealed multifocal subacute infarcts within the left occipital lobe, thalamus, genu of the internal capsule, and cerebral peduncle.      MRI was then obtained, confirming multiple subacute ischemic infarctions in the left PCA territory.      About 1 hour after completion of MRI, she exhibited neurological worsening and a Code Stroke was called. Teleneurology evaluated the patient with exam revealing an NIHSS of 10, which was worse than her exams documented earlier. As there was no clear LKN, she was not a candidate for IV thrombolysis or mechanical thrombectomy. DAPT was initiated along with IVF. Clinical Impression: Clinical Impression: Pt presents with a mild oropharyngeal dysphagia significant reduced posterior lingual control resulting in pre-swallow loss of boluses to the level of the pyriform sinus as well as pre-swallow transient laryngeal penetration of thin liquids (via teaspoon, cup, straw) and solids and pre-swallow transient aspiration of thin liquids during  sequential straw sips. Cued cough was ineffective in clearing material from upper airway as it was inconsistently weak and difficult to perform secondary to apraxia. Pt benefited from cues to take single sips of thin liquids via straw. Pt educated re: results, recommendations, and SLP POC. Pt shown videofluoroscopic  images for education. Pt verbalized understanding; however, reinforcement of content may be needed. RN also made aware of results, recommendations, and SLP POC. Recommend continuation of current diet with safe swallowing strategies/aspiration precautions as outlined below. SLP to f/u x1 for diet tolerance. SLP to continue established POC for speech/language tx. Factors that may increase risk of adverse event in presence of aspiration Rubye Oaks [employer] Clearance Coots 2021): No data recorded Recommendations/Plan: Swallowing Evaluation Recommendations Swallowing Evaluation Recommendations Recommendations: PO diet PO Diet Recommendation: Regular; Thin liquids (Level 0) Medication Administration: Whole meds with puree (vs crushed) Supervision: Patient able to self-feed; Intermittent supervision/cueing for swallowing strategies (set up and initial cueing for safe swallowing strategies) Swallowing strategies  : Minimize environmental distractions; Slow rate; Small bites/sips Postural changes: Position pt fully  upright for meals; Stay upright 30-60 min after meals Oral care recommendations: Oral care QID (4x/day) Caregiver Recommendations: Avoid jello, ice cream, thin soups, popsicles; Remove water pitcher; Have oral suction available Treatment Plan Treatment Plan Treatment recommendations: Therapy as outlined in treatment plan below Follow-up recommendations: Acute inpatient rehab (3 hours/day) Functional status assessment: Patient has had a recent decline in their functional status and demonstrates the ability to make significant improvements in function in a reasonable and predictable amount of time. Treatment frequency: Min 3x/week (x1 visit for dysphagia; continue POC for speech/language) Treatment duration: 2 weeks Interventions: Aspiration precaution training; Compensatory techniques; Patient/family education; Diet toleration management by SLP Recommendations Recommendations for follow up therapy are one component of a  multi-disciplinary discharge planning process, led by the attending physician.  Recommendations may be updated based on patient status, additional functional criteria and insurance authorization. Assessment: Orofacial Exam: Orofacial Exam Oral Cavity: Oral Hygiene: WFL Oral Cavity - Dentition: Adequate natural dentition Anatomy: Anatomy: WFL Boluses Administered: Boluses Administered Boluses Administered: Thin liquids (Level 0); Puree; Solid  Oral Impairment Domain: Oral Impairment Domain Lip Closure: No labial escape Tongue control during bolus hold: Posterior escape of greater than half of bolus Bolus preparation/mastication: Timely and efficient chewing and mashing Bolus transport/lingual motion: Brisk tongue motion Oral residue: Trace residue lining oral structures Location of oral residue : Floor of mouth; Tongue; Palate Initiation of pharyngeal swallow : Pyriform sinuses  Pharyngeal Impairment Domain: Pharyngeal Impairment Domain Soft palate elevation: No bolus between soft palate (SP)/pharyngeal wall (PW) Laryngeal elevation: Complete superior movement of thyroid cartilage with complete approximation of arytenoids to epiglottic petiole Anterior hyoid excursion: Complete anterior movement Epiglottic movement: Complete inversion Laryngeal vestibule closure: Incomplete, narrow column air/contrast in laryngeal vestibule Pharyngeal stripping wave : Present - complete Pharyngeal contraction (A/P view only): N/A Pharyngoesophageal segment opening: Complete distension and complete duration, no obstruction of flow Tongue base retraction: Trace column of contrast or air between tongue base and PPW Pharyngeal residue: Collection of residue within or on pharyngeal structures Location of pharyngeal residue: Valleculae  Esophageal Impairment Domain: Esophageal Impairment Domain Esophageal clearance upright position: Complete clearance, esophageal coating Pill: Esophageal Impairment Domain Esophageal clearance upright  position: Complete clearance, esophageal coating Penetration/Aspiration Scale Score: Penetration/Aspiration Scale Score 1.  Material does not enter airway: Thin liquids (Level 0); Puree; Solid 2.  Material enters airway, remains ABOVE vocal cords then ejected out: Thin liquids (Level 0); Solid 6.  Material enters airway, passes BELOW cords then ejected out: Thin liquids (Level 0) Compensatory Strategies: Compensatory Strategies Compensatory strategies: Yes Other(comment): Ineffective Ineffective Other(comment): Thin liquid (Level 0) (cued cough)   General Information: Caregiver present: No  Diet Prior to this Study: Regular; Thin liquids (Level 0)   Temperature : Normal   Respiratory Status: WFL   Supplemental O2: None (Room air)   History of Recent Intubation: No  Behavior/Cognition: Alert; Cooperative; Pleasant mood; Confused; Distractible; Requires cueing Self-Feeding Abilities: Needs set-up for self-feeding Baseline vocal quality/speech: Normal Volitional Cough: Able to elicit Volitional Swallow: Able to elicit Exam Limitations: No limitations Goal Planning: Prognosis for improved oropharyngeal function: Fair Barriers to Reach Goals: Language deficits Barriers/Prognosis Comment: new CVA, extension Patient/Family Stated Goal: continue eating and drinking a regular diet Consulted and agree with results and recommendations: Patient; Nurse Pain: Pain Assessment Pain Assessment: No/denies pain Breathing: 0 Negative Vocalization: 0 Facial Expression: 0 Body Language: 0 Consolability: 0 PAINAD Score: 0 End of Session: Start Time:SLP Start Time (ACUTE ONLY): 1110 Stop Time: SLP Stop Time (ACUTE  ONLY): 1140 Time Calculation:SLP Time Calculation (min) (ACUTE ONLY): 30 min Charges: SLP Evaluations $ SLP Speech Visit: 1 Visit SLP Evaluations $MBS Swallow: 1 Procedure SLP visit diagnosis: SLP Visit Diagnosis: Dysphagia, oropharyngeal phase (R13.12) Past Medical History: Past Medical History: Diagnosis Date  Diabetes mellitus  without complication (HCC)   Hypertension  Past Surgical History: Past Surgical History: Procedure Laterality Date  NO PAST SURGERIES    SHOULDER CLOSED REDUCTION Left 03/30/2016  Procedure: CLOSED REDUCTION SHOULDER;  Surgeon: Donato Heinz, MD;  Location: [facility] ORS;  Service: Orthopedics;  Laterality: Left; [name], M.S., CCC-SLP Speech-Language Pathologist [facility] [phone] Destiny Palmer) Woodroe Chen 07/26/2022, 12:05 PM       Scheduled Meds:   stroke: early stages of recovery book   Does not apply Once   aspirin  81 mg Oral Daily   atorvastatin  40 mg Oral Daily   Chlorhexidine Gluconate Cloth  6 each Topical Q0600   clopidogrel  75 mg Oral Daily   enoxaparin (LOVENOX) injection  40 mg Subcutaneous Q24H   FLUoxetine  40 mg Oral Daily   insulin aspart  0-15 Units Subcutaneous TID WC   insulin aspart  0-5 Units Subcutaneous QHS   insulin glargine-yfgn  20 Units Subcutaneous QHS   Continuous Infusions:     LOS: 3 days      Tresa Moore, MD [facility]   If 7PM-7AM, please contact night-coverage  07/26/2022, 2:01 PM

## 2022-07-26 NOTE — Progress Notes (Signed)
Inpatient Rehab Coordinator Note:  I spoke with pt's sister, Beverely Low, over the phone to discuss CIR recommendations and goals/expectations of CIR stay.  We reviewed 3 hrs/day of therapy, physician follow up, and average length of stay 2 weeks (dependent upon progress) with goals of min assist.  We discussed need for 24/7 care given pt's current level of function and short ELOS on rehab.  Per Beverely Low, pt does not have someone that could provide that level of care for her at discharge and wishes to continue pursuing SNF for longer term rehab.  She again notes that pt has VA coverage, though I don't see this noted in her coverage list or documented in the HAR notes that the [place] was notified of her admission which may pose a barrier to coverage.  I let TOC and MD know.  WIll sign off for CIR at this time.   Estill Dooms, PT, DPT Admissions Coordinator [phone] 07/26/22  3:16 PM

## 2022-07-27 LAB — GLUCOSE, CAPILLARY
Glucose-Capillary: 207 mg/dL — ABNORMAL HIGH (ref 70–99)
Glucose-Capillary: 293 mg/dL — ABNORMAL HIGH (ref 70–99)
Glucose-Capillary: 311 mg/dL — ABNORMAL HIGH (ref 70–99)
Glucose-Capillary: 240 mg/dL — ABNORMAL HIGH (ref 70–99)

## 2022-07-27 LAB — CULTURE, BLOOD (ROUTINE X 2)
Special Requests: ADEQUATE
Special Requests: ADEQUATE

## 2022-07-27 MED ORDER — INSULIN ASPART 100 UNIT/ML IJ SOLN
5.0000 [IU] | Freq: Three times a day (TID) | INTRAMUSCULAR | Status: DC
  Administered 2022-07-27 – 2022-07-29 (×7): 5 [IU] via SUBCUTANEOUS
  Filled 2022-07-27 (×7): qty 1

## 2022-07-27 MED ORDER — INSULIN GLARGINE-YFGN 100 UNIT/ML ~~LOC~~ SOLN
23.0000 [IU] | Freq: Every day | SUBCUTANEOUS | Status: DC
  Administered 2022-07-27 – 2022-07-29 (×3): 23 [IU] via SUBCUTANEOUS
  Filled 2022-07-27 (×4): qty 0.23

## 2022-07-27 MED ORDER — TAMSULOSIN HCL 0.4 MG PO CAPS
0.4000 mg | ORAL_CAPSULE | Freq: Every day | ORAL | Status: DC
  Administered 2022-07-27 – 2022-08-03 (×8): 0.4 mg via ORAL
  Filled 2022-07-27 (×8): qty 1

## 2022-07-27 NOTE — Inpatient Diabetes Management (Signed)
Inpatient Diabetes Program Recommendations  AACE/ADA: New Consensus Statement on Inpatient Glycemic Control   Target Ranges:  Prepandial:   less than 140 mg/dL      Peak postprandial:   less than 180 mg/dL (1-2 hours)      Critically ill patients:  140 - 180 mg/dL    Latest Reference Range & Units 07/26/22 08:30 07/26/22 11:50 07/26/22 16:14 07/26/22 20:54 07/27/22 08:24  Glucose-Capillary 70 - 99 mg/dL 161 (H) 096 (H) 045 (H) 270 (H) 207 (H)   Review of Glycemic Control  Diabetes history: DM2 Outpatient Diabetes medications: Lantus 25 units daily, Novolog 3 units TID with meals, Novolog 0-15 units TID with meals Current orders for Inpatient glycemic control: Semglee 20 units QHS, Novolog 0-15 units TID with meals, Novolog 0-5 units QHS   Inpatient Diabetes Program Recommendations:     Insulin: Please consider increasing Semglee to 23 units QHS and ordering Novolog 5 units TID with meals for meal coverage if patient eats at least 50% of meals.   Diet: If appropriate, consider discontinuing Regular diet and order Carb Modified diet.  HbgA1C: A1C 14.5% on 07/22/22 indicating an average glucose of 369 mg/dl over the past 2-3 months.   Thanks, Orlando Penner, RN, MSN, CDCES Diabetes Coordinator Inpatient Diabetes Program [phone] (Team Pager from 8am to 5pm)

## 2022-07-27 NOTE — Progress Notes (Signed)
Physical Therapy Treatment Patient Details Name: Destiny Palmer MRN: [id number] DOB: Feb 13, 1966 Today's Date: 07/27/2022   History of Present Illness Destiny Palmer is a 56yoF who comes to [facility] on 5/2 with AMS, BG in 600s on arrival. Code stoke called later in evening after acute onset motor deficits RUE. PMH: DM, HTN, SI, Lt shoulder dislocation s/p closed reduction 2018. MRI brain revealing "multifocal acute ischemia within the left PCA territory, including the occipital lobe, temporal lobe and thalamus."  At baseline pt denies any impairment of gait or balance, not use of AD, reports to live with mother whom she provides caregiver assistance.    PT Comments    Co-treatment performed this date. Pt is making good progress towards goals with ability perform table top position with +2 assist and perform neuro-mus re-education and WBing through R hemi body. Also perform transfer to chair and ambulation in hallway x 10' with +2 assist and chair railing. Will continue to progress towards goals.  Recommendations for follow up therapy are one component of a multi-disciplinary discharge planning process, led by the attending physician.  Recommendations may be updated based on patient status, additional functional criteria and insurance authorization.  Follow Up Recommendations  Can patient physically be transported by private vehicle: No    Assistance Recommended at Discharge Intermittent Supervision/Assistance  Patient can return home with the following Two people to help with walking and/or transfers;Two people to help with bathing/dressing/bathroom;Help with stairs or ramp for entrance;Assist for transportation   Equipment Recommendations  None recommended by PT    Recommendations for Other Services       Precautions / Restrictions Precautions Precautions: Fall Precaution Comments: hemi RUE Required Braces or Orthoses: Sling Restrictions Weight Bearing Restrictions: No     Mobility   Bed Mobility Overal bed mobility: Needs Assistance Bed Mobility: Rolling, Supine to Sit, Sit to Supine Rolling: Max assist, +2 for physical assistance   Supine to sit: Mod assist, +2 for physical assistance Sit to supine: Mod assist   General bed mobility comments: needs assist for positioning and transitioning to quadraped. Physical assist for positioning of R hemibody    Transfers Overall transfer level: Needs assistance Equipment used: 1 person hand held assist Transfers: Sit to/from Stand, Bed to chair/wheelchair/BSC Sit to Stand: Min assist Stand pivot transfers: Mod assist         General transfer comment: step by step cues for sequencing with good follow through, however poor retention noted. Decreased physical assist required this date. Stil requires cues for impulsive nature.    Ambulation/Gait Ambulation/Gait assistance: Max assist, +2 physical assistance Gait Distance (Feet): 10 Feet Assistive device: 1 person hand held assist Gait Pattern/deviations: Step-to pattern       General Gait Details: ambulated using L hallway railing with R knee blocked during WBing and physical assist for advancing R foot. Cues for sequencing with seated rest break after 5 steps and reviewed proper step length and weight shift. +2 for skilled intervention plus additional person for chair follow   Stairs             Wheelchair Mobility    Modified Rankin (Stroke Patients Only)       Balance Overall balance assessment: Needs assistance Sitting-balance support: Single extremity supported, Feet unsupported Sitting balance-Leahy Scale: Fair     Standing balance support: Single extremity supported Standing balance-Leahy Scale: Poor  Cognition Arousal/Alertness: Awake/alert Behavior During Therapy: WFL for tasks assessed/performed, Impulsive Overall Cognitive Status: Within Functional Limits for tasks assessed                                  General Comments: impulsive, improves with cues        Exercises Other Exercises Other Exercises: NMR in tabletop position on bed: MAX A x2 quadreped<>tall kneeling, weight shifting in quadreped    General Comments        Pertinent Vitals/Pain Pain Assessment Pain Assessment: No/denies pain    Home Living                          Prior Function            PT Goals (current goals can now be found in the care plan section) Acute Rehab PT Goals Patient Stated Goal: to get up to the chair PT Goal Formulation: With patient Progress towards PT goals: Progressing toward goals    Frequency    Min 4X/week      PT Plan Current plan remains appropriate    Co-evaluation PT/OT/SLP Co-Evaluation/Treatment: Yes Reason for Co-Treatment: To address functional/ADL transfers PT goals addressed during session: Mobility/safety with mobility OT goals addressed during session: ADL's and self-care      AM-PAC PT "6 Clicks" Mobility   Outcome Measure  Help needed turning from your back to your side while in a flat bed without using bedrails?: A Lot Help needed moving from lying on your back to sitting on the side of a flat bed without using bedrails?: A Lot Help needed moving to and from a bed to a chair (including a wheelchair)?: A Lot Help needed standing up from a chair using your arms (e.g., wheelchair or bedside chair)?: A Lot Help needed to walk in hospital room?: Total Help needed climbing 3-5 steps with a railing? : Total 6 Click Score: 10    End of Session Equipment Utilized During Treatment: Gait belt Activity Tolerance: Patient tolerated treatment well;No increased pain Patient left: in chair;with chair alarm set Nurse Communication: Mobility status PT Visit Diagnosis: Other abnormalities of gait and mobility (R26.89);Unsteadiness on feet (R26.81);Difficulty in walking, not elsewhere classified (R26.2);Muscle weakness (generalized)  (M62.81);Other symptoms and signs involving the nervous system (R29.898);Hemiplegia and hemiparesis Hemiplegia - Right/Left: Right Hemiplegia - dominant/non-dominant: Dominant Hemiplegia - caused by: Cerebral infarction     Time: 1120-1150 PT Time Calculation (min) (ACUTE ONLY): 30 min  Charges:  $Neuromuscular Re-education: 8-22 mins                     Elizabeth Palau, PT, DPT, GCS [phone]    Jolanda Mccann 07/27/2022, 3:14 PM

## 2022-07-27 NOTE — Progress Notes (Signed)
Speech Language Pathology Treatment: Cognitive-Linquistic  Patient Details Name: Destiny Palmer MRN: [id number] DOB: 1965/08/28 Today's Date: 07/27/2022 Time: 4098-1191 SLP Time Calculation (min) (ACUTE ONLY): 40 min  Assessment / Plan / Recommendation Clinical Impression  Pt seen today for ongoing speech-language tx. Pt awakened easily from resting; and stated "can work on it" referring to her speech when asked. Pt upgraded to a regular diet s/p MBSS yesterday; both pt and NSG reported good toleration of the diet today w/ no overt s/s of aspiration noted.  Pt on RA, afebrile.   Pt continues to presents w/ a non-fluent aphasia most c/w Broca's; potential Apraxia of speech. Any slight Dysarthria is minimal; speech was 100% intelligible. Suspect component of receptive deficit as evidenced by errors in nonverbal language tasks.  Pt's speech is often effortful and halting limited to 1-3 words w/ reduced structure of sentence. Speech is marked by semantic/phonemic paraphasias and verbal perseveration. Pt exhibited object naming deficits w/ paraphasias, reduced awareness of deficit/intelligibility, and difficulty w/ repetition tasks -- however, pt seemed to improve in this area w/ the more automatic and known the verbal tasks were. Accuracy of response when given phonemic cues and structure of sentence completion increased. Pt w/ reduced auditory comprehension affecting follow through w/ object function tasks, nonverbal tasks -- "show me what you do w/ ___.  Pt's attention to tasks was functional w/ intermittent verbal cues given. Pt was quite stimulable w/ verbal hierachical cues (e.g. sentence completion, phonemic cueing).  Per pt's presentation today, pt requires Supervision in the home setting for safety and follow through w/ tasks. She would benefit from ongoing speech-language tx while inpt and at Discharge to target speech-communication abilities in ADLs. ST service to follow while pt is admitted per  POC. NSG udpated.         HPI HPI: Pt is an 57 y.o. female with a PMHx of DM and HTN who presented to the ED yesterday with lethargy, nausea and vomiting. Her CBG was over 600 with EMS. Family noted her to have some slurred speech and difficulty walking. She had recently stopped taking her metformin. She did not fit criteria for DKA but HHS was on the DDx. She was treated with IVF and glucose trended downward. UDS was negative. U/A showed significant glucosuria.      CT head revealed multifocal subacute infarcts within the left occipital lobe, thalamus, genu of the internal capsule, and cerebral peduncle.      MRI was then obtained, confirming multiple subacute ischemic infarctions in the left PCA territory.      About 1 hour after completion of MRI, she exhibited neurological worsening and a Code Stroke was called. Teleneurology evaluated the patient with exam revealing an NIHSS of 10, which was worse than her exams documented earlier. As there was no clear LKN, she was not a candidate for IV thrombolysis or mechanical thrombectomy. DAPT was initiated along with IVF.      SLP Plan  Continue with current plan of care      Recommendations for follow up therapy are one component of a multi-disciplinary discharge planning process, led by the attending physician.  Recommendations may be updated based on patient status, additional functional criteria and insurance authorization.    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

## 2022-07-27 NOTE — Progress Notes (Signed)
PROGRESS NOTE    [name]  YNW:[id number] DOB: Sep 10, 1965 DOA: 07/22/2022 PCP: [facility]    Brief Narrative:   57 y.o. female with medical history significant of diabetes mellitus reportedly nonadherent to diabetic regimen who presents to the ED with chief complaint of altered mentation.  Patient has been lethargic with nausea and vomiting.  Remains alert and oriented x 3.  Apparently CBG was over 600 with EMS.  310 on BMP on arrival.   Apparently had some slurred speech and difficulty walking.  Patient unable to provide much history.  On my evaluation patient is resting comfortably in bed.  She is alert and oriented x 3.  She is a little confused on the location, states we are in [place].  Does have a psychiatric history including adjustment disorder, history of suicide attempt in 2017, PTSD.  Psychiatric regimen is listed on her home medication reconciliation but there are no documented outpatient visits for several years so unclear exactly what she has been taken.   Urinalysis shows significant glucosuria and some evidence of leukocytes.  No fever.  CT head demonstrates evidence of old CVA versus less likely space-occupying lesion.   Laboratory investigation overall unrevealing apart from some hyperglycemia.  Remainder of CMP within normal limits.  Urine drug screen negative.  Chest x-ray negative  5/3: Stroke alert called last night for acute right lower extremity and right upper extremity weakness.  MRI that was ordered by myself did demonstrate large multifocal acute infarct in PCA territory.  Teleneurology consulted.  Started on aspirin Plavix dual antiplatelet therapy.  5/4: no appreciable changes in exam   Assessment & Plan:   Principal Problem:   Acute metabolic encephalopathy Active Problems:   Cerebrovascular accident (CVA) (HCC)  Acute CVA Acute metabolic encephalopathy And now appears that patient's altered mentation was secondary to evolving infarct.   Unknown last known well.  Remains markedly weak on the right Plan: Continue DAPT aspirin and Plavix x 3 weeks followed by aspirin monotherapy indefinitely Continue high intensity statin Telemetry monitoring Normotensive blood pressure goal CIR has declined admission due to poor family support Will need skilled nursing facility, no bed offers as of yet  Insulin-dependent diabetes mellitus with hyperglycemia Hemoglobin A1c 14.5.  Patient not adherent to prescribed insulin regimen. Plan: Increase Semglee 23 units nightly NovoLog 5 units 3 times daily with meals Moderate sliding scale Nightly coverage Carb modified diet Diabetes coordinator consult Will need insulin regimen at time of discharge  Essential hypertension Out of window for permissive hypertension.   Normotensive blood pressure goal.  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  Medically ready for discharge   Level of care: Progressive  Consultants:  Neurology  Procedures:  None  Antimicrobials: Rocephin    Subjective: Seen and examined.  No acute neurologic changes.  Affect remains flattened  Objective: Vitals:   07/26/22 1953 07/26/22 2326 07/27/22 0439 07/27/22 0821  BP: 126/74 137/68 (!) 132/95 (!) 119/44  Pulse: 68 61 66 61  Resp: 18 18 18 16   Temp: 98.4 F (36.9 C) 98.1 F (36.7 C) 98 F (36.7 C) 97.9 F (36.6 C)  TempSrc: Oral Oral Oral Oral  SpO2: 99% 98% 99% 100%  Weight:   70.1 kg   Height:  Intake/Output Summary (Last 24 hours) at 07/27/2022 1207 Last data filed at 07/27/2022 1047 Gross per 24 hour  Intake 360 ml  Output 2150 ml  Net -1790 ml   Filed Weights   07/22/22 1700 07/27/22 0439  Weight: 68.9 kg 70.1  kg    Examination:  General exam: NAD Respiratory system: Normal respiratory effort.  Lungs clear.  Room air Cardiovascular system: S1-S2, RRR, no murmurs, no pedal edema Gastrointestinal system: Soft, NT/ND, normal bowel sounds Central nervous system: Alert and oriented.  RUE/RLE marked deficit Extremities: Markedly decreased power right upper and lower extremities Skin: No rashes, lesions or ulcers Psychiatry: Judgement and insight appear impaired. Mood & affect flattened.     Data Reviewed: I have personally reviewed following labs and imaging studies  CBC: Recent Labs  Lab 07/22/22 1309 07/23/22 0411  WBC 6.9 6.3  NEUTROABS 4.6  --   HGB 17.1* 13.1  HCT 51.0* 39.7  MCV 90.3 90.8  PLT 269 190   Basic Metabolic Panel: Recent Labs  Lab 07/22/22 1548 07/23/22 0411  NA 137 139  K 5.1 3.6  CL 101 102  CO2 27 29  GLUCOSE 310* 244*  BUN 15 15  CREATININE 0.65 0.62  CALCIUM 8.9 8.5*  MG 1.9 1.8  PHOS 3.3 3.0   GFR: Estimated Creatinine Clearance: 72.6 mL/min (by C-G formula based on SCr of 0.62 mg/dL). Liver Function Tests: Recent Labs  Lab 07/22/22 1548 07/23/22 0411  AST 33 23  ALT 22 23  ALKPHOS 84 71  BILITOT 1.5* 0.8  PROT 6.8 6.1*  ALBUMIN 3.2* 2.9*   No results for input(s): "LIPASE", "AMYLASE" in the last 168 hours. No results for input(s): "AMMONIA" in the last 168 hours. Coagulation Profile: No results for input(s): "INR", "PROTIME" in the last 168 hours. Cardiac Enzymes: No results for input(s): "CKTOTAL", "CKMB", "CKMBINDEX", "TROPONINI" in the last 168 hours. BNP (last 3 results) No results for input(s): "PROBNP" in the last 8760 hours. HbA1C: No results for input(s): "HGBA1C" in the last 72 hours.  CBG: Recent Labs  Lab 07/26/22 0830 07/26/22 1150 07/26/22 1614 07/26/22 2054 07/27/22 0824  GLUCAP 192* 343* 250* 270* 207*   Lipid Profile: No results for input(s): "CHOL", "HDL", "LDLCALC", "TRIG", "CHOLHDL", "LDLDIRECT" in the  last 72 hours.  Thyroid Function Tests: No results for input(s): "TSH", "T4TOTAL", "FREET4", "T3FREE", "THYROIDAB" in the last 72 hours. Anemia Panel: No results for input(s): "VITAMINB12", "FOLATE", "FERRITIN", "TIBC", "IRON", "RETICCTPCT" in the last 72 hours. Sepsis Labs: Recent Labs  Lab 07/22/22 1755  LATICACIDVEN 1.4    Recent Results (from the past 240 hour(s))  Urine Culture     Status: Abnormal   Collection Time: 07/22/22  1:07 PM   Specimen: Urine, Clean Catch  Result Value Ref Range Status   Specimen Description   Final    URINE, CLEAN CATCH Performed at [facility], [address], [place], [place] [address]    Special Requests   Final    NONE Performed at [facility], [address], [place], [place] [address]    Culture (A)  Final    40,000 COLONIES/mL STREPTOCOCCUS AGALACTIAE TESTING AGAINST S. AGALACTIAE NOT ROUTINELY PERFORMED DUE TO PREDICTABILITY OF AMP/PEN/VAN SUSCEPTIBILITY. Performed at [facility], [address], [place], [place] [address]    Report Status 07/23/2022 FINAL  Final  Blood culture (routine x 2)     Status: None   Collection Time: 07/22/22  5:48 PM   Specimen: BLOOD  Result Value Ref Range Status   Specimen Description BLOOD RIGHT ANTECUBITAL  Final   Special Requests   Final    BOTTLES DRAWN AEROBIC AND ANAEROBIC Blood Culture adequate volume   Culture   Final    NO GROWTH 5 DAYS Performed at [facility], [address], [place], [place] [address]    Report Status 07/27/2022 FINAL  Final  Blood culture (routine x 2)     Status: None   Collection Time: 07/22/22  5:55 PM   Specimen: BLOOD  Result Value Ref Range Status   Specimen Description BLOOD LEFT ANTECUBITAL  Final   Special Requests   Final    BOTTLES DRAWN AEROBIC AND ANAEROBIC Blood Culture adequate volume   Culture   Final    NO GROWTH 5 DAYS Performed at [facility], [address] [place], [place] [address]     Report Status 07/27/2022 FINAL  Final         Radiology Studies: DG Swallowing Func-Speech Pathology  Result Date: 07/26/2022 Table formatting from the original result was not included. Modified Barium Swallow Study Patient Details Name: Destiny Palmer MRN: [id number] Date of Birth: November 30, 1965 Today's Date: 07/26/2022 HPI/PMH: HPI: Pt is an 57 y.o. female with a PMHx of DM and HTN who presented to the ED yesterday with lethargy, nausea and vomiting. Her CBG was over 600 with EMS. Family noted her to have some slurred speech and difficulty walking. She had recently stopped taking her metformin. She did not fit criteria for DKA but HHS was on the DDx. She was treated with IVF and glucose trended downward. UDS was negative. U/A showed significant glucosuria.      CT head revealed multifocal subacute infarcts within the left occipital lobe, thalamus, genu of the internal capsule, and cerebral peduncle.      MRI was then obtained, confirming multiple subacute ischemic infarctions in the left PCA territory.      About 1 hour after completion of MRI, she exhibited neurological worsening and a Code Stroke was called. Teleneurology evaluated the patient with exam revealing an NIHSS of 10, which was worse than her exams documented earlier. As there was no clear LKN, she was not a candidate for IV thrombolysis or mechanical thrombectomy. DAPT was initiated along with IVF. Clinical Impression: Clinical Impression: Pt presents with a mild oropharyngeal dysphagia significant reduced posterior lingual control resulting in pre-swallow loss of boluses to the level of the pyriform sinus as well as pre-swallow transient laryngeal penetration of thin liquids (via teaspoon, cup, straw) and solids and pre-swallow transient aspiration of thin liquids during  sequential straw sips. Cued cough was ineffective in clearing material from upper airway as it was inconsistently weak and difficult to perform secondary to apraxia. Pt  benefited from cues to take single sips of thin liquids via straw. Pt educated re: results, recommendations, and SLP POC. Pt shown videofluoroscopic images for education. Pt verbalized understanding; however, reinforcement of content may be needed. RN also made aware of results, recommendations, and SLP POC. Recommend continuation of current diet with safe swallowing strategies/aspiration precautions as outlined below. SLP to f/u x1 for diet tolerance. SLP to continue established POC for speech/language tx. Factors that may increase risk of adverse event in presence of aspiration Rubye Oaks [employer] Clearance Coots 2021): No data recorded Recommendations/Plan: Swallowing Evaluation Recommendations Swallowing Evaluation Recommendations Recommendations: PO diet PO Diet Recommendation: Regular; Thin liquids (Level 0) Medication Administration: Whole meds with puree (vs crushed) Supervision: Patient able to self-feed; Intermittent  supervision/cueing for swallowing strategies (set up and initial cueing for safe swallowing strategies) Swallowing strategies  : Minimize environmental distractions; Slow rate; Small bites/sips Postural changes: Position pt fully upright for meals; Stay upright 30-60 min after meals Oral care recommendations: Oral care QID (4x/day) Caregiver Recommendations: Avoid jello, ice cream, thin soups, popsicles; Remove water pitcher; Have oral suction available Treatment Plan Treatment Plan Treatment recommendations: Therapy as outlined in treatment plan below Follow-up recommendations: Acute inpatient rehab (3 hours/day) Functional status assessment: Patient has had a recent decline in their functional status and demonstrates the ability to make significant improvements in function in a reasonable and predictable amount of time. Treatment frequency: Min 3x/week (x1 visit for dysphagia; continue POC for speech/language) Treatment duration: 2 weeks Interventions: Aspiration precaution training; Compensatory techniques;  Patient/family education; Diet toleration management by SLP Recommendations Recommendations for follow up therapy are one component of a multi-disciplinary discharge planning process, led by the attending physician.  Recommendations may be updated based on patient status, additional functional criteria and insurance authorization. Assessment: Orofacial Exam: Orofacial Exam Oral Cavity: Oral Hygiene: WFL Oral Cavity - Dentition: Adequate natural dentition Anatomy: Anatomy: WFL Boluses Administered: Boluses Administered Boluses Administered: Thin liquids (Level 0); Puree; Solid  Oral Impairment Domain: Oral Impairment Domain Lip Closure: No labial escape Tongue control during bolus hold: Posterior escape of greater than half of bolus Bolus preparation/mastication: Timely and efficient chewing and mashing Bolus transport/lingual motion: Brisk tongue motion Oral residue: Trace residue lining oral structures Location of oral residue : Floor of mouth; Tongue; Palate Initiation of pharyngeal swallow : Pyriform sinuses  Pharyngeal Impairment Domain: Pharyngeal Impairment Domain Soft palate elevation: No bolus between soft palate (SP)/pharyngeal wall (PW) Laryngeal elevation: Complete superior movement of thyroid cartilage with complete approximation of arytenoids to epiglottic petiole Anterior hyoid excursion: Complete anterior movement Epiglottic movement: Complete inversion Laryngeal vestibule closure: Incomplete, narrow column air/contrast in laryngeal vestibule Pharyngeal stripping wave : Present - complete Pharyngeal contraction (A/P view only): N/A Pharyngoesophageal segment opening: Complete distension and complete duration, no obstruction of flow Tongue base retraction: Trace column of contrast or air between tongue base and PPW Pharyngeal residue: Collection of residue within or on pharyngeal structures Location of pharyngeal residue: Valleculae  Esophageal Impairment Domain: Esophageal Impairment Domain Esophageal  clearance upright position: Complete clearance, esophageal coating Pill: Esophageal Impairment Domain Esophageal clearance upright position: Complete clearance, esophageal coating Penetration/Aspiration Scale Score: Penetration/Aspiration Scale Score 1.  Material does not enter airway: Thin liquids (Level 0); Puree; Solid 2.  Material enters airway, remains ABOVE vocal cords then ejected out: Thin liquids (Level 0); Solid 6.  Material enters airway, passes BELOW cords then ejected out: Thin liquids (Level 0) Compensatory Strategies: Compensatory Strategies Compensatory strategies: Yes Other(comment): Ineffective Ineffective Other(comment): Thin liquid (Level 0) (cued cough)   General Information: Caregiver present: No  Diet Prior to this Study: Regular; Thin liquids (Level 0)   Temperature : Normal   Respiratory Status: WFL   Supplemental O2: None (Room air)   History of Recent Intubation: No  Behavior/Cognition: Alert; Cooperative; Pleasant mood; Confused; Distractible; Requires cueing Self-Feeding Abilities: Needs set-up for self-feeding Baseline vocal quality/speech: Normal Volitional Cough: Able to elicit Volitional Swallow: Able to elicit Exam Limitations: No limitations Goal Planning: Prognosis for improved oropharyngeal function: Fair Barriers to Reach Goals: Language deficits Barriers/Prognosis Comment: new CVA, extension Patient/Family Stated Goal: continue eating and drinking a regular diet Consulted and agree with results and recommendations: Patient; Nurse Pain: Pain Assessment Pain Assessment: No/denies pain Breathing: 0 Negative  Vocalization: 0 Facial Expression: 0 Body Language: 0 Consolability: 0 PAINAD Score: 0 End of Session: Start Time:SLP Start Time (ACUTE ONLY): 1110 Stop Time: SLP Stop Time (ACUTE ONLY): 1140 Time Calculation:SLP Time Calculation (min) (ACUTE ONLY): 30 min Charges: SLP Evaluations $ SLP Speech Visit: 1 Visit SLP Evaluations $MBS Swallow: 1 Procedure SLP visit diagnosis: SLP  Visit Diagnosis: Dysphagia, oropharyngeal phase (R13.12) Past Medical History: Past Medical History: Diagnosis Date  Diabetes mellitus without complication (HCC)   Hypertension  Past Surgical History: Past Surgical History: Procedure Laterality Date  NO PAST SURGERIES    SHOULDER CLOSED REDUCTION Left 03/30/2016  Procedure: CLOSED REDUCTION SHOULDER;  Surgeon: Donato Heinz, MD;  Location: [facility] ORS;  Service: Orthopedics;  Laterality: Left; [name], M.S., CCC-SLP Speech-Language Pathologist [facility] [phone] Arnette Felts) Woodroe Chen 07/26/2022, 12:05 PM       Scheduled Meds:   stroke: early stages of recovery book   Does not apply Once   aspirin  81 mg Oral Daily   atorvastatin  40 mg Oral Daily   Chlorhexidine Gluconate Cloth  6 each Topical Q0600   clopidogrel  75 mg Oral Daily   enoxaparin (LOVENOX) injection  40 mg Subcutaneous Q24H   FLUoxetine  40 mg Oral Daily   insulin aspart  0-15 Units Subcutaneous TID WC   insulin aspart  0-5 Units Subcutaneous QHS   insulin aspart  5 Units Subcutaneous TID WC   insulin glargine-yfgn  23 Units Subcutaneous QHS   tamsulosin  0.4 mg Oral Daily   Continuous Infusions:     LOS: 4 days      Tresa Moore, MD [facility]   If 7PM-7AM, please contact night-coverage  07/27/2022, 12:07 PM

## 2022-07-27 NOTE — Progress Notes (Signed)
Occupational Therapy Treatment Patient Details Name: Destiny Palmer MRN: [id number] DOB: [birth year] Today's Date: 07/27/2022   History of present illness Destiny Palmer is a 56yoF who [facility] on 5[facility] with AMS, BG in 600s on arrival. Code stoke called later in evening after acute onset motor deficits RUE. PMH: DM, HTN, SI, Lt shoulder dislocation s/p closed reduction 2018. MRI brain revealing "multifocal acute ischemia within the left PCA territory, including the occipital lobe, temporal lobe and thalamus."  At baseline pt denies any impairment of gait or balance, not use of AD, reports to live with mother whom she provides caregiver assistance.   OT comments  [name] was seen for OT/PT co-treatment on this date. Upon arrival to room pt reclined in bed, agreeable to tx. Pt requires MAX A x2 rolling bed level into prone position then into tabletop position. Good tolerance for NMR tabletop<>tall kneeling, weight shifting L+R, anterior/posterior. NMR for Upper trunk control seated EOB: weight shifting L + R and returning to midline, cues to WB through R hand. MAX A x2 + L rail to complete 5 steps + 5 steps with seated rest break. Assist for R knee block, weight shifting, and advancing RLE. MOD A don/doff sling seated EOB. MIN A sit<>stand with cues for technique. Pt making good progress toward goals, will continue to follow POC. Discharge recommendation remains appropriate.     Recommendations for follow up therapy are one component of a multi-disciplinary discharge planning process, led by the attending physician.  Recommendations may be updated based on patient status, additional functional criteria and insurance authorization.    Assistance Recommended at Discharge Frequent or constant Supervision/Assistance  Patient can return home with the following  Two people to help with bathing/dressing/bathroom;A lot of help with walking and/or transfers;Help with stairs or ramp for entrance    Equipment Recommendations  Other (comment) (defer)    Recommendations for Other Services      Precautions / Restrictions Precautions Precautions: Fall Precaution Comments: hemi RUE Required Braces or Orthoses: Sling (transfers only) Restrictions Weight Bearing Restrictions: No       Mobility Bed Mobility Overal bed mobility: Needs Assistance Bed Mobility: Rolling, Supine to Sit, Sit to Supine Rolling: Max assist, +2 for physical assistance   Supine to sit: Mod assist, +2 for physical assistance Sit to supine: Mod assist        Transfers Overall transfer level: Needs assistance Equipment used: 1 person hand held assist Transfers: Sit to/from Stand, Bed to chair/wheelchair/BSC Sit to Stand: Min assist Stand pivot transfers: Mod assist         General transfer comment: cues for technique as pt is impulsive     Balance Overall balance assessment: Needs assistance Sitting-balance support: Single extremity supported, Feet unsupported Sitting balance-Leahy Scale: Fair     Standing balance support: Single extremity supported Standing balance-Leahy Scale: Poor                             ADL either performed or assessed with clinical judgement   ADL Overall ADL's : Needs assistance/impaired                                       General ADL Comments: MOD A simulated BSC t/f. MAX A x2 + L rail to complete 5 steps + 5 steps with seated rest break. Assist for  R knee block, weight shifting, and advancing RLE. MOD A don/doff sling seated EOB.       Cognition Arousal/Alertness: Awake/alert Behavior During Therapy: WFL for tasks assessed/performed, Impulsive Overall Cognitive Status: Within Functional Limits for tasks assessed                                 General Comments: impulsive, improves with cues        Exercises Exercises: Other exercises Other Exercises Other Exercises: NMR for Upper trunk control seated EOB-  weight shifting L + R and returning to midline, cues to WB through R hand and activate RUE with trace activation noted. Other Exercises: NMR in tabletop position on bed: MAX A x2 quadreped<>tall kneeling, weight shifting in quadreped            Pertinent Vitals/ Pain       Pain Assessment Pain Assessment: No/denies pain   Frequency  Min 3X/week        Progress Toward Goals  OT Goals(current goals can now be found in the care plan section)  Progress towards OT goals: Progressing toward goals  Acute Rehab OT Goals Patient Stated Goal: to walk OT Goal Formulation: With patient Time For Goal Achievement: 08/06/22 Potential to Achieve Goals: Good ADL Goals Pt Will Perform Grooming: with min assist;sitting Pt Will Perform Upper Body Dressing: with supervision;sitting Pt Will Transfer to Toilet: with min assist;stand pivot transfer;bedside commode Pt/caregiver will Perform Home Exercise Program: Increased strength;Right Upper extremity;Independently  Plan Discharge plan remains appropriate;Frequency remains appropriate    Co-evaluation    PT/OT/SLP Co-Evaluation/Treatment: Yes Reason for Co-Treatment: To address functional/ADL transfers PT goals addressed during session: Mobility/safety with mobility OT goals addressed during session: ADL's and self-care      AM-PAC OT "6 Clicks" Daily Activity     Outcome Measure   Help from another person eating meals?: A Little Help from another person taking care of personal grooming?: A Lot Help from another person toileting, which includes using toliet, bedpan, or urinal?: A Lot Help from another person bathing (including washing, rinsing, drying)?: A Lot Help from another person to put on and taking off regular upper body clothing?: A Lot Help from another person to put on and taking off regular lower body clothing?: A Lot 6 Click Score: 13    End of Session Equipment Utilized During Treatment: Gait belt  OT Visit  Diagnosis: Other abnormalities of gait and mobility (R26.89);Muscle weakness (generalized) (M62.81)   Activity Tolerance Patient tolerated treatment well   Patient Left in chair;with call bell/phone within reach;with chair alarm set   Nurse Communication          Time: 1610-9604 OT Time Calculation (min): 40 min  Charges: OT General Charges $OT Visit: 1 Visit OT Treatments $Neuromuscular Re-education: 23-37 mins  Kathie Dike, M.S. OTR/L  07/27/22, 1:53 PM  ascom [phone]

## 2022-07-27 NOTE — Progress Notes (Signed)
NT was transferring pt from chair to bed and assisted pt to floor. RN called to bedside and assisted in returning patient to bed. Floor pad was in place as well as nonslip socks. Vitals signs checked and stable. No reports of pain

## 2022-07-28 LAB — GLUCOSE, CAPILLARY
Glucose-Capillary: 196 mg/dL — ABNORMAL HIGH (ref 70–99)
Glucose-Capillary: 274 mg/dL — ABNORMAL HIGH (ref 70–99)
Glucose-Capillary: 197 mg/dL — ABNORMAL HIGH (ref 70–99)
Glucose-Capillary: 311 mg/dL — ABNORMAL HIGH (ref 70–99)
Glucose-Capillary: 221 mg/dL — ABNORMAL HIGH (ref 70–99)
Glucose-Capillary: 210 mg/dL — ABNORMAL HIGH (ref 70–99)

## 2022-07-28 NOTE — Inpatient Diabetes Management (Signed)
Inpatient Diabetes Program Recommendations  AACE/ADA: New Consensus Statement on Inpatient Glycemic Control   Target Ranges:  Prepandial:   less than 140 mg/dL      Peak postprandial:   less than 180 mg/dL (1-2 hours)      Critically ill patients:  140 - 180 mg/dL    Latest Reference Range & Units 07/27/22 08:24 07/27/22 12:08 07/27/22 16:33 07/27/22 21:27  Glucose-Capillary 70 - 99 mg/dL 161 (H) 096 (H) 045 (H) 240 (H)   Review of Glycemic Control  Diabetes history: DM2 Outpatient Diabetes medications: Lantus 25 units daily, Novolog 3 units TID with meals, Novolog 0-15 units TID with meals Current orders for Inpatient glycemic control: Semglee 23 units QHS, Novolog 0-15 units TID with meals, Novolog 0-5 units QHS, Novolog 5 units TID with meals   Inpatient Diabetes Program Recommendations:     Insulin: Please consider increasing meal coverage to Novolog 10 units TID with meals.  Thanks, Orlando Penner, RN, MSN, CDCES Diabetes Coordinator Inpatient Diabetes Program [phone] (Team Pager from 8am to 5pm)

## 2022-07-28 NOTE — Progress Notes (Signed)
Physical Therapy Treatment Patient Details Name: Destiny Palmer MRN: [id number] DOB: 1965-07-27 Today's Date: 07/28/2022   History of Present Illness Destiny Palmer is a 56yoF who comes to [facility] on 5/2 with AMS, BG in 600s on arrival. Code stoke called later in evening after acute onset motor deficits RUE. PMH: DM, HTN, SI, Lt shoulder dislocation s/p closed reduction 2018. MRI brain revealing "multifocal acute ischemia within the left PCA territory, including the occipital lobe, temporal lobe and thalamus."  At baseline pt denies any impairment of gait or balance, not use of AD, reports to live with mother whom she provides caregiver assistance.    PT Comments    Pt is making good progress towards goals with ability to perform multiple transfers this date demonstrating less impulsive behaviors. Decreased physical assistance required this date and switched chair to L side of bed to assist RN staff with transfer back to bed. Abdominal strengthening performed this date with great effort. Pt left in chair for lunch with chair alarm activated. Will continue to progress as able.  Recommendations for follow up therapy are one component of a multi-disciplinary discharge planning process, led by the attending physician.  Recommendations may be updated based on patient status, additional functional criteria and insurance authorization.  Follow Up Recommendations  Can patient physically be transported by private vehicle: No    Assistance Recommended at Discharge Intermittent Supervision/Assistance  Patient can return home with the following Two people to help with walking and/or transfers;Two people to help with bathing/dressing/bathroom;Help with stairs or ramp for entrance;Assist for transportation   Equipment Recommendations  None recommended by PT    Recommendations for Other Services       Precautions / Restrictions Precautions Precautions: Fall Precaution Comments: hemi RUE Required Braces  or Orthoses: Sling Restrictions Weight Bearing Restrictions: No     Mobility  Bed Mobility Overal bed mobility: Needs Assistance Bed Mobility: Supine to Sit     Supine to sit: Min assist     General bed mobility comments: transitioned to sitting up on R side of bed. Pt with improved sitting balance and follows commands well    Transfers Overall transfer level: Needs assistance Equipment used: 1 person hand held assist Transfers: Sit to/from Stand, Bed to chair/wheelchair/BSC Sit to Stand: Min assist Stand pivot transfers: Min assist         General transfer comment: improved technique this date. Still requires R knee blocked and cues for hand placement. No AD used    Ambulation/Gait               General Gait Details: not performed this date   Stairs             Wheelchair Mobility    Modified Rankin (Stroke Patients Only)       Balance Overall balance assessment: Needs assistance Sitting-balance support: Single extremity supported, Feet unsupported Sitting balance-Leahy Scale: Fair     Standing balance support: Single extremity supported Standing balance-Leahy Scale: Poor                              Cognition Arousal/Alertness: Awake/alert Behavior During Therapy: WFL for tasks assessed/performed, Impulsive Overall Cognitive Status: Within Functional Limits for tasks assessed                                 General Comments: impulsive, improves with cues  Exercises Other Exercises Other Exercises: core strengthening performed this date including B elbow taps x 5 reps with min assist. Oblique work in both directions. In addition, modified abdominal crunches x 10 reps using chair back. Other Exercises: seated hip add squeezes and continued work on R LE muscle activation with zero muscle movement noted    General Comments        Pertinent Vitals/Pain Pain Assessment Pain Assessment: No/denies pain     Home Living                          Prior Function            PT Goals (current goals can now be found in the care plan section) Acute Rehab PT Goals Patient Stated Goal: to get up to the chair PT Goal Formulation: With patient Progress towards PT goals: Progressing toward goals    Frequency    Min 4X/week      PT Plan Current plan remains appropriate    Co-evaluation              AM-PAC PT "6 Clicks" Mobility   Outcome Measure  Help needed turning from your back to your side while in a flat bed without using bedrails?: A Little Help needed moving from lying on your back to sitting on the side of a flat bed without using bedrails?: A Little Help needed moving to and from a bed to a chair (including a wheelchair)?: A Little Help needed standing up from a chair using your arms (e.g., wheelchair or bedside chair)?: A Little Help needed to walk in hospital room?: Total Help needed climbing 3-5 steps with a railing? : Total 6 Click Score: 14    End of Session Equipment Utilized During Treatment: Gait belt Activity Tolerance: Patient tolerated treatment well;No increased pain Patient left: in chair;with chair alarm set Nurse Communication: Mobility status PT Visit Diagnosis: Other abnormalities of gait and mobility (R26.89);Unsteadiness on feet (R26.81);Difficulty in walking, not elsewhere classified (R26.2);Muscle weakness (generalized) (M62.81);Other symptoms and signs involving the nervous system (R29.898);Hemiplegia and hemiparesis Hemiplegia - Right/Left: Right Hemiplegia - dominant/non-dominant: Dominant Hemiplegia - caused by: Cerebral infarction     Time: 1610-9604 PT Time Calculation (min) (ACUTE ONLY): 27 min  Charges:  $Therapeutic Exercise: 8-22 mins $Neuromuscular Re-education: 8-22 mins                     Elizabeth Palau, PT, DPT, GCS [phone]    Kraven Calk 07/28/2022, 1:16 PM

## 2022-07-28 NOTE — Progress Notes (Signed)
Occupational Therapy Treatment Patient Details Name: Destiny Palmer MRN: [id number] DOB: 1966-01-20 Today's Date: 07/28/2022   History of present illness Destiny Palmer is a 56yoF who comes to [facility] on 5/[facility] with AMS, BG in 600s on arrival. Code stoke called later in evening after acute onset motor deficits RUE. PMH: DM, HTN, SI, Lt shoulder dislocation s/p closed reduction 2018. MRI brain revealing "multifocal acute ischemia within the left PCA territory, including the occipital lobe, temporal lobe and thalamus."  At baseline pt denies any impairment of gait or balance, not use of AD, reports to live with mother whom she provides caregiver assistance.   OT comments  Ms Carruthers was seen for OT treatment on this date focused on ADLs. Upon arrival to room pt reclined in bed, agreeable to tx. Pt requires MOD A exit bed. MOD A don/doff sling with MOD cues. Educated on joint protection strategies. MIN A return to bed. Pt making good progress toward goals, will continue to follow POC. Discharge recommendation remains appropriate.     Recommendations for follow up therapy are one component of a multi-disciplinary discharge planning process, led by the attending physician.  Recommendations may be updated based on patient status, additional functional criteria and insurance authorization.    Assistance Recommended at Discharge Frequent or constant Supervision/Assistance  Patient can return home with the following  Two people to help with bathing/dressing/bathroom;A lot of help with walking and/or transfers;Help with stairs or ramp for entrance   Equipment Recommendations  Other (comment) (defer)    Recommendations for Other Services      Precautions / Restrictions Precautions Precautions: Fall Precaution Comments: hemi RUE Required Braces or Orthoses: Sling Restrictions Weight Bearing Restrictions: No       Mobility Bed Mobility Overal bed mobility: Needs Assistance Bed Mobility: Supine to  Sit     Supine to sit: Mod assist Sit to supine: Min assist        Transfers                   General transfer comment: deferred     Balance Overall balance assessment: Needs assistance Sitting-balance support: Single extremity supported, Feet unsupported Sitting balance-Leahy Scale: Fair                                     ADL either performed or assessed with clinical judgement   ADL Overall ADL's : Needs assistance/impaired                                       General ADL Comments: MOD A don/doff sling seated EOB      Cognition Arousal/Alertness: Awake/alert Behavior During Therapy: WFL for tasks assessed/performed, Impulsive Overall Cognitive Status: Within Functional Limits for tasks assessed                                 General Comments: impulsive, improves with cues                   Pertinent Vitals/ Pain       Pain Assessment Pain Assessment: No/denies pain   Frequency  Min 3X/week        Progress Toward Goals  OT Goals(current goals can now be found in the care plan section)  Progress towards OT goals: Progressing toward goals  Acute Rehab OT Goals Patient Stated Goal: to return to PLOF OT Goal Formulation: With patient Time For Goal Achievement: 08/06/22 Potential to Achieve Goals: Good ADL Goals Pt Will Perform Grooming: with min assist;sitting Pt Will Perform Upper Body Dressing: with supervision;sitting Pt Will Transfer to Toilet: with min assist;stand pivot transfer;bedside commode Pt/caregiver will Perform Home Exercise Program: Increased strength;Right Upper extremity;Independently  Plan Discharge plan remains appropriate;Frequency remains appropriate    Co-evaluation                 AM-PAC OT "6 Clicks" Daily Activity     Outcome Measure   Help from another person eating meals?: A Little Help from another person taking care of personal grooming?: A  Lot Help from another person toileting, which includes using toliet, bedpan, or urinal?: A Lot Help from another person bathing (including washing, rinsing, drying)?: A Lot Help from another person to put on and taking off regular upper body clothing?: A Lot Help from another person to put on and taking off regular lower body clothing?: A Lot 6 Click Score: 13    End of Session    OT Visit Diagnosis: Other abnormalities of gait and mobility (R26.89);Muscle weakness (generalized) (M62.81)   Activity Tolerance Patient tolerated treatment well   Patient Left in bed;with call bell/phone within reach;with bed alarm set   Nurse Communication          Time: 1610-9604 OT Time Calculation (min): 17 min  Charges: OT General Charges $OT Visit: 1 Visit OT Treatments $Self Care/Home Management : 8-22 mins  Kathie Dike, M.S. OTR/L  07/28/22, 4:45 PM  ascom [phone]

## 2022-07-28 NOTE — Progress Notes (Signed)
Pt had an unwitnessed fall from chair attempting to return to bed without notifying staff. Call bell was within reach, nonslip socks on, Chair alarm activated, but not plugged into wall. VS stable, no reports of pain. Pt returned to bed by staff, MD and family made aware.

## 2022-07-29 LAB — CREATININE, SERUM
Creatinine, Ser: 0.59 mg/dL (ref 0.44–1.00)
GFR, Estimated: >60 mL/min (ref >60)

## 2022-07-29 LAB — CBC
HCT: 43.1 % (ref 36.0–46.0)
Hemoglobin: 14.6 g/dL (ref 12.0–15.0)
MCH: 30.6 pg (ref 26.0–34.0)
MCHC: 33.9 g/dL (ref 30.0–36.0)
MCV: 90.4 fL (ref 80.0–100.0)
Platelets: 251 10*3/uL (ref 150–400)
RBC: 4.77 MIL/uL (ref 3.87–5.11)
RDW: 11.9 % (ref 11.5–15.5)
WBC: 6.7 10*3/uL (ref 4.0–10.5)
nRBC: 0 % (ref 0.0–0.2)

## 2022-07-29 LAB — GLUCOSE, CAPILLARY
Glucose-Capillary: 141 mg/dL — ABNORMAL HIGH (ref 70–99)
Glucose-Capillary: 340 mg/dL — ABNORMAL HIGH (ref 70–99)
Glucose-Capillary: 196 mg/dL — ABNORMAL HIGH (ref 70–99)
Glucose-Capillary: 232 mg/dL — ABNORMAL HIGH (ref 70–99)

## 2022-07-29 MED ORDER — ORAL CARE MOUTH RINSE: 15.0000 mL | OROMUCOSAL | Status: DC | PRN

## 2022-07-29 MED ORDER — INSULIN ASPART 100 UNIT/ML IJ SOLN
7.0000 [IU] | Freq: Three times a day (TID) | INTRAMUSCULAR | Status: DC
  Administered 2022-07-29 – 2022-08-03 (×14): 7 [IU] via SUBCUTANEOUS
  Filled 2022-07-29 (×12): qty 1

## 2022-07-29 NOTE — Plan of Care (Signed)
Problem: Coping: Goal: Ability to adjust to condition or change in health will improve Outcome: Progressing   Problem: Fluid Volume: Goal: Ability to maintain a balanced intake and output will improve Outcome: Progressing   Problem: Metabolic: Goal: Ability to maintain appropriate glucose levels will improve Outcome: Progressing   Problem: Nutritional: Goal: Maintenance of adequate nutrition will improve Outcome: Progressing Goal: Progress toward achieving an optimal weight will improve Outcome: Progressing   Problem: Skin Integrity: Goal: Risk for impaired skin integrity will decrease Outcome: Progressing   Problem: Tissue Perfusion: Goal: Adequacy of tissue perfusion will improve Outcome: Progressing   Problem: Ischemic Stroke/TIA Tissue Perfusion: Goal: Complications of ischemic stroke/TIA will be minimized Outcome: Progressing   Problem: Coping: Goal: Will verbalize positive feelings about self Outcome: Progressing Goal: Will identify appropriate support needs Outcome: Progressing   Problem: Health Behavior/Discharge Planning: Goal: Goals will be collaboratively established with patient/family Outcome: Progressing   Problem: Self-Care: Goal: Verbalization of feelings and concerns over difficulty with self-care will improve Outcome: Progressing Goal: Ability to communicate needs accurately will improve Outcome: Progressing   Problem: Nutrition: Goal: Risk of aspiration will decrease Outcome: Progressing Goal: Dietary intake will improve Outcome: Progressing   Problem: Clinical Measurements: Goal: Ability to maintain clinical measurements within normal limits will improve Outcome: Progressing Goal: Will remain free from infection Outcome: Progressing Goal: Diagnostic test results will improve Outcome: Progressing Goal: Respiratory complications will improve Outcome: Progressing Goal: Cardiovascular complication will be avoided Outcome: Progressing    Problem: Activity: Goal: Risk for activity intolerance will decrease Outcome: Progressing   Problem: Nutrition: Goal: Adequate nutrition will be maintained Outcome: Progressing   Problem: Coping: Goal: Level of anxiety will decrease Outcome: Progressing   Problem: Elimination: Goal: Will not experience complications related to bowel motility Outcome: Progressing Goal: Will not experience complications related to urinary retention Outcome: Progressing   Problem: Pain Managment: Goal: General experience of comfort will improve Outcome: Progressing   Problem: Safety: Goal: Ability to remain free from injury will improve Outcome: Progressing   Problem: Skin Integrity: Goal: Risk for impaired skin integrity will decrease Outcome: Progressing   Problem: Education: Goal: Ability to describe self-care measures that may prevent or decrease complications (Diabetes Survival Skills Education) will improve Outcome: Not Progressing Note: Patient experiencing confusion at this time. Will continue to educate and reorient as needed. Goal: Individualized Educational Video(s) Outcome: Not Progressing Note: Patient experiencing confusion at this time. Will continue to educate and reorient as needed.   Problem: Health Behavior/Discharge Planning: Goal: Ability to identify and utilize available resources and services will improve Outcome: Not Progressing Note: Patient experiencing confusion at this time. Will continue to educate and reorient as needed. Goal: Ability to manage health-related needs will improve Outcome: Not Progressing Note: Patient experiencing confusion at this time. Will continue to educate and reorient as needed.   Problem: Education: Goal: Knowledge of disease or condition will improve Outcome: Not Progressing Note: Patient experiencing confusion at this time. Will continue to educate and reorient as needed. Goal: Knowledge of secondary prevention will improve (MUST  DOCUMENT ALL) Outcome: Not Progressing Note: Patient experiencing confusion at this time. Will continue to educate and reorient as needed. Goal: Knowledge of patient specific risk factors will improve Loraine Leriche N/A or DELETE if not current risk factor) Outcome: Not Progressing Note: Patient experiencing confusion at this time. Will continue to educate and reorient as needed.   Problem: Health Behavior/Discharge Planning: Goal: Ability to manage health-related needs will improve Outcome: Not Progressing Note: Patient experiencing confusion  at this time. Will continue to educate and reorient as needed.   Problem: Self-Care: Goal: Ability to participate in self-care as condition permits will improve Outcome: Not Progressing Note: Patient experiencing confusion at this time. Will continue to educate and reorient as needed.   Problem: Education: Goal: Knowledge of General Education information will improve Description: Including pain rating scale, medication(s)/side effects and non-pharmacologic comfort measures Outcome: Not Progressing Note: Patient experiencing confusion at this time. Will continue to educate and reorient as needed.   Problem: Health Behavior/Discharge Planning: Goal: Ability to manage health-related needs will improve Outcome: Not Progressing Note: Patient experiencing confusion at this time. Will continue to educate and reorient as needed.

## 2022-07-29 NOTE — Progress Notes (Signed)
Speech Language Pathology Treatment: Dysphagia;Cognitive-Linquistic  Patient Details Name: Destiny Palmer MRN: [id number] DOB: 10/11/1965 Today's Date: 07/29/2022 Time: 1300-1330 SLP Time Calculation (min) (ACUTE ONLY): 30 min  Assessment / Plan / Recommendation Clinical Impression  Pt received upright in bed. Pt alert, pleasant, and cooperative. Niece, Hospital doctor, present.   Pt seen for diet tolerance. Pt observed with items from meal tray including pizza, peas, broccoli, and tea. Pt demonstrated an intact oral swallow. No overt s/sx pharyngeal dysphagia. Independent use of safe swallowing strategies. No f/u warranted for swallowing.   Pt seen for speech/language tx. 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 Articulatory precision has improved and speech was 100% intelligible. Suspect component of receptive deficit as evidenced by errors in nonverbal language tasks. Pt's speech is often effortful and halting limited to short phrases and sentences which were often agrammatic Speech is marked by semantic/phonemic paraphasias and verbal perseveration. Pt required moderate verbal cues to answer biographical questions and during structured conversation about pt's hobby.  Pt stimulable to verbal hierachical cues (e.g. sentence completion, phonemic cueing).  Pt appeared to have good awareness of errors and able to endorse frustration with CLOF re: communication. Pt and niece educated re: changes to communication following stroke, basic communication strategies, progress to date, and SLP POC. Both verbalized understanding/agreement. Reinforcement of content likely needed.   Based on today's tx, recommend ongoing SLP services acute vs post-acute for aphasia.   HPI HPI: Pt is an 57 y.o. female with a PMHx of DM and HTN who presented to the ED yesterday with lethargy, nausea and vomiting. Her CBG was over 600 with EMS. Family noted her to have some  slurred speech and difficulty walking. She had recently stopped taking her metformin. She did not fit criteria for DKA but HHS was on the DDx. She was treated with IVF and glucose trended downward. UDS was negative. U/A showed significant glucosuria.      CT head revealed multifocal subacute infarcts within the left occipital lobe, thalamus, genu of the internal capsule, and cerebral peduncle.      MRI was then obtained, confirming multiple subacute ischemic infarctions in the left PCA territory.      About 1 hour after completion of MRI, she exhibited neurological worsening and a Code Stroke was called. Teleneurology evaluated the patient with exam revealing an NIHSS of 10, which was worse than her exams documented earlier. As there was no clear LKN, she was not a candidate for IV thrombolysis or mechanical thrombectomy. DAPT was initiated along with IVF.      SLP Plan  Continue with current plan of care      Recommendations for follow up therapy are one component of a multi-disciplinary discharge planning process, led by the attending physician.  Recommendations may be updated based on patient status, additional functional criteria and insurance authorization.    Recommendations  Diet recommendations: Regular;Thin liquid Medication Administration:  (as tolerated) Supervision: Patient able to self feed (set up) Compensations: Slow rate;Small sips/bites Postural Changes and/or Swallow Maneuvers: Seated upright 90 degrees;Upright 30-60 min after meal;Out of bed for meals                  Oral care BID;Staff/trained caregiver to provide oral care   Frequent or constant Supervision/Assistance Aphasia (R47.01);Apraxia (R48.2);Dysphagia, pharyngeal phase (R13.13) Cerebral infarction   Continue with current plan of care    [name], M.S., CCC-SLP Speech-Language Pathologist [facility] [phone] (ASCOM)  Woodroe Chen  07/29/2022,  2:24 PM

## 2022-07-29 NOTE — Progress Notes (Signed)
Progress Note   Patient: Destiny Palmer:[id number] DOB: [birth year] DOA: 07/22/2022     6 DOS: the patient was seen and examined on 07/29/2022   Brief hospital course: 57 y.o. female with medical history significant of diabetes mellitus reportedly nonadherent to diabetic regimen who presents to the ED with chief complaint of altered mentation.  Patient has been lethargic with nausea and vomiting.  Remains alert and oriented x 3.  Apparently CBG was over 600 with EMS.  310 on BMP on arrival.   Apparently had some slurred speech and difficulty walking.  Patient unable to provide much history.  On my evaluation patient is resting comfortably in bed.  She is alert and oriented x 3.  She is a little confused on the location, states we are in [place].  Does have a psychiatric history including adjustment disorder, history of suicide attempt in 2017, PTSD.  Psychiatric regimen is listed on her home medication reconciliation but there are no documented outpatient visits for several years so unclear exactly what she has been taken.   Urinalysis shows significant glucosuria and some evidence of leukocytes.  No fever.  CT head demonstrates evidence of old CVA versus less likely space-occupying lesion.   Laboratory investigation overall unrevealing apart from some hyperglycemia.  Remainder of CMP within normal limits.  Urine drug screen negative.  Chest x-ray negative   5/3: Stroke alert called last night for acute right lower extremity and right upper extremity weakness.  MRI that was ordered by myself did demonstrate large multifocal acute infarct in PCA territory.  Teleneurology consulted.  Started on aspirin Plavix dual antiplatelet therapy.   5/4: no appreciable changes in exam   5/8 : S/P unwitnessed fall while attempting to return to bed.   5/9 : No new complaints. Awaiting placement     Assessment and Plan:  Principal Problem:   Acute metabolic encephalopathy Active Problems:    Cerebrovascular accident (CVA) (HCC)   Acute CVA Acute metabolic encephalopathy Appears that patient's altered mentation was secondary to evolving infarct.  Unknown last known well Right-sided hemiparesis persists Continue DAPT aspirin and Plavix x 3 weeks followed by aspirin monotherapy indefinitely Continue high intensity statin Telemetry monitoring Normotensive blood pressure goal CIR has declined admission due to poor family support Will need skilled nursing facility, no bed offers as of yet     Insulin-dependent diabetes mellitus with hyperglycemia Hemoglobin A1c 14.5.  Patient not adherent to prescribed insulin regimen. Continue Semglee 23 units nightly NovoLog 5 units 3 times daily with meals Moderate sliding scale Nightly coverage Carb modified diet Diabetes coordinator consult Will need insulin regimen at time of discharge   Essential hypertension Out of window for permissive hypertension.   Normotensive blood pressure goal.   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  Has no new complaints  Physical Exam: Vitals:   07/28/22 2354 07/29/22 0435 07/29/22 0805 07/29/22 1139  BP: 107/77 107/72 118/81 92/68  Pulse: 64 62 73 83  Resp: 20 18  16   Temp: 98 F (36.7 C) (!) 97.5 F (36.4 C) (!) 97.5 F (36.4 C) 98 F (36.7 C)  TempSrc:   Oral   SpO2: 98% 100% 100% 100%  Weight:      Height:       Vitals and nursing note  reviewed.  Constitutional:      Appearance: Normal appearance.  HENT:     Head: Normocephalic and atraumatic.     Nose: Nose normal.     Mouth/Throat:     Mouth: Mucous membranes are moist.  Eyes:     Conjunctiva/sclera: Conjunctivae normal.  Cardiovascular:     Rate and Rhythm: Normal rate and regular rhythm.  Pulmonary:     Effort: Pulmonary effort is normal.      Breath sounds: Normal breath sounds.  Abdominal:     General: Abdomen is flat. Bowel sounds are normal.     Palpations: Abdomen is soft.  Musculoskeletal:        General: Normal range of motion.     Cervical back: Normal range of motion and neck supple.  Skin:    General: Skin is warm and dry.  Neurological:     Mental Status: She is alert.     Comments: Right-sided hemiparesis  Psychiatric:        Mood and Affect: Mood normal.        Behavior: Behavior normal.   Data Reviewed: Relevant notes from primary care and specialist visits, past discharge summaries as available in EHR, including Care Everywhere. Prior diagnostic testing as pertinent to current admission diagnoses Updated medications and problem lists for reconciliation ED course, including vitals, labs, imaging, treatment and response to treatment Triage notes, nursing and pharmacy notes and ED provider's notes Notable results as noted in HPI  There are no new results to review at this time.  Family Communication: Called and left a voicemail for her sister, Ms. Ebony Cargo.  Awaiting callback.  Disposition: Status is: Inpatient Remains inpatient appropriate because: Awaiting placement  Planned Discharge Destination: Skilled nursing facility    Time spent: 33 minutes  Author: Lucile Shutters, MD 07/29/2022 1:32 PM  For on call review [email or web address].

## 2022-07-29 NOTE — Plan of Care (Signed)
  Problem: Coping: Goal: Ability to adjust to condition or change in health will improve Outcome: Progressing   Problem: Fluid Volume: Goal: Ability to maintain a balanced intake and output will improve Outcome: Progressing   Problem: Metabolic: Goal: Ability to maintain appropriate glucose levels will improve Outcome: Progressing   Problem: Nutritional: Goal: Maintenance of adequate nutrition will improve Outcome: Progressing Goal: Progress toward achieving an optimal weight will improve Outcome: Progressing   Problem: Skin Integrity: Goal: Risk for impaired skin integrity will decrease Outcome: Progressing   Problem: Tissue Perfusion: Goal: Adequacy of tissue perfusion will improve Outcome: Progressing   Problem: Ischemic Stroke/TIA Tissue Perfusion: Goal: Complications of ischemic stroke/TIA will be minimized Outcome: Progressing   Problem: Coping: Goal: Will verbalize positive feelings about self Outcome: Progressing Goal: Will identify appropriate support needs Outcome: Progressing   Problem: Health Behavior/Discharge Planning: Goal: Goals will be collaboratively established with patient/family Outcome: Progressing   Problem: Self-Care: Goal: Verbalization of feelings and concerns over difficulty with self-care will improve Outcome: Progressing Goal: Ability to communicate needs accurately will improve Outcome: Progressing   Problem: Nutrition: Goal: Risk of aspiration will decrease Outcome: Progressing Goal: Dietary intake will improve Outcome: Progressing   Problem: Clinical Measurements: Goal: Ability to maintain clinical measurements within normal limits will improve Outcome: Progressing Goal: Will remain free from infection Outcome: Progressing Goal: Diagnostic test results will improve Outcome: Progressing Goal: Respiratory complications will improve Outcome: Progressing Goal: Cardiovascular complication will be avoided Outcome: Progressing    Problem: Activity: Goal: Risk for activity intolerance will decrease Outcome: Progressing   Problem: Nutrition: Goal: Adequate nutrition will be maintained Outcome: Progressing   Problem: Coping: Goal: Level of anxiety will decrease Outcome: Progressing   Problem: Elimination: Goal: Will not experience complications related to bowel motility Outcome: Progressing Goal: Will not experience complications related to urinary retention Outcome: Progressing   Problem: Pain Managment: Goal: General experience of comfort will improve Outcome: Progressing   Problem: Safety: Goal: Ability to remain free from injury will improve Outcome: Progressing   Problem: Skin Integrity: Goal: Risk for impaired skin integrity will decrease Outcome: Progressing

## 2022-07-29 NOTE — Progress Notes (Signed)
Progress Note   Patient: Destiny Palmer ZOX:[id number] DOB: April 28, 1965 DOA: 07/22/2022     6 DOS: the patient was seen and examined on 07/29/2022   Brief hospital course:  57 y.o. female with medical history significant of diabetes mellitus reportedly nonadherent to diabetic regimen who presents to the ED with chief complaint of altered mentation.  Patient has been lethargic with nausea and vomiting.  Remains alert and oriented x 3.  Apparently CBG was over 600 with EMS.  310 on BMP on arrival.   Apparently had some slurred speech and difficulty walking.  Patient unable to provide much history.  On my evaluation patient is resting comfortably in bed.  She is alert and oriented x 3.  She is a little confused on the location, states we are in [place].  Does have a psychiatric history including adjustment disorder, history of suicide attempt in 2017, PTSD.  Psychiatric regimen is listed on her home medication reconciliation but there are no documented outpatient visits for several years so unclear exactly what she has been taken.   Urinalysis shows significant glucosuria and some evidence of leukocytes.  No fever.  CT head demonstrates evidence of old CVA versus less likely space-occupying lesion.   Laboratory investigation overall unrevealing apart from some hyperglycemia.  Remainder of CMP within normal limits.  Urine drug screen negative.  Chest x-ray negative   5/3: Stroke alert called last night for acute right lower extremity and right upper extremity weakness.  MRI that was ordered by myself did demonstrate large multifocal acute infarct in PCA territory.  Teleneurology consulted.  Started on aspirin Plavix dual antiplatelet therapy.   5/4: no appreciable changes in exam   5/8 : S/P unwitnessed fall while attempting to return to bed.     Assessment and Plan:  Principal Problem:   Acute metabolic encephalopathy Active Problems:   Cerebrovascular accident (CVA) (HCC)   Acute  CVA Acute metabolic encephalopathy Appears that patient's altered mentation was secondary to evolving infarct.  Unknown last known well Right-sided hemiparesis persists Continue DAPT aspirin and Plavix x 3 weeks followed by aspirin monotherapy indefinitely Continue high intensity statin Telemetry monitoring Normotensive blood pressure goal CIR has declined admission due to poor family support Will need skilled nursing facility, no bed offers as of yet    Insulin-dependent diabetes mellitus with hyperglycemia Hemoglobin A1c 14.5.  Patient not adherent to prescribed insulin regimen. Continue Semglee 23 units nightly NovoLog 5 units 3 times daily with meals Moderate sliding scale Nightly coverage Carb modified diet Diabetes coordinator consult Will need insulin regimen at time of discharge   Essential hypertension Out of window for permissive hypertension.   Normotensive blood pressure goal.   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 No new complaints  Physical Exam: Vitals:   07/28/22 1948 07/28/22 2354 07/29/22 0435 07/29/22 0805  BP: 110/66 107/77 107/72 118/81  Pulse: 79 64 62 73  Resp: 18 20 18    Temp: 98 F (36.7 C) 98 F (36.7 C) (!) 97.5 F (36.4 C) (!) 97.5 F (36.4 C)  TempSrc:    Oral  SpO2: 98% 98% 100% 100%  Weight:      Height:       Physical Exam Vitals and nursing note reviewed.  Constitutional:      Appearance: Normal appearance.  HENT:     Head: Normocephalic and atraumatic.     Nose: Nose normal.     Mouth/Throat:     Mouth: Mucous membranes are moist.  Eyes:     Conjunctiva/sclera: Conjunctivae normal.  Cardiovascular:     Rate and Rhythm: Normal rate and regular rhythm.  Pulmonary:     Effort: Pulmonary effort is normal.     Breath sounds: Normal breath sounds.   Abdominal:     General: Abdomen is flat. Bowel sounds are normal.     Palpations: Abdomen is soft.  Musculoskeletal:        General: Normal range of motion.     Cervical back: Normal range of motion and neck supple.  Skin:    General: Skin is warm and dry.  Neurological:     Mental Status: She is alert.     Comments: Right-sided hemiparesis  Psychiatric:        Mood and Affect: Mood normal.        Behavior: Behavior normal.     Data Reviewed: Relevant notes from primary care and specialist visits, past discharge summaries as available in EHR, including Care Everywhere. Prior diagnostic testing as pertinent to current admission diagnoses Updated medications and problem lists for reconciliation ED course, including vitals, labs, imaging, treatment and response to treatment Triage notes, nursing and pharmacy notes and ED provider's notes Notable results as noted in HPI There are no new results to review at this time.  Family Communication: None  Disposition: Status is: Inpatient Remains inpatient appropriate because: Awaiting discharge to skilled nursing facility  Planned Discharge Destination: Skilled nursing facility    Time spent: 32  minutes  Author: Lucile Shutters, MD 07/29/2022 8:44 AM  For on call review [email or web address].

## 2022-07-29 NOTE — Progress Notes (Signed)
Physical Therapy Treatment Patient Details Name: Destiny Palmer MRN: [id number] DOB: Oct 16, 1965 Today's Date: 07/29/2022   History of Present Illness Destiny Palmer is a 56yoF who comes [facility] on 5[facility] with AMS, BG in 600s on arrival. Code stoke called later in evening after acute onset motor deficits RUE. PMH: DM, HTN, SI, Lt shoulder dislocation s/p closed reduction 2018. MRI brain revealing "multifocal acute ischemia within the left PCA territory, including the occipital lobe, temporal lobe and thalamus."  At baseline pt denies any impairment of gait or balance, not use of AD, reports to live with mother whom she provides caregiver assistance.    PT Comments    Pt received in bed, c/o feeling bored. PROM and prolonged stretching to R LE, hip, knee, ankle through all available planes. No increased tone noted, pt denies sensation below knee, however will continue to assess due to current level of aphasia. Treatment goal was to transfer to bedside chair, once sitting EOB pt experienced increased dizziness and HA. BP reported in Flowsheet and notified MD, RN. Pt returned to long sitting in bed to comfort. Deferred OOB at this time. Will reassess next session for functional progression tolerance.    Recommendations for follow up therapy are one component of a multi-disciplinary discharge planning process, led by the attending physician.  Recommendations may be updated based on patient status, additional functional criteria and insurance authorization.  Follow Up Recommendations  Can patient physically be transported by private vehicle: No    Assistance Recommended at Discharge Intermittent Supervision/Assistance  Patient can return home with the following Two people to help with walking and/or transfers;Two people to help with bathing/dressing/bathroom;Help with stairs or ramp for entrance;Assist for transportation   Equipment Recommendations  None recommended by PT    Recommendations for Other  Services       Precautions / Restrictions Precautions Precautions: Fall Precaution Comments: hemi RUE Required Braces or Orthoses: Sling Restrictions Weight Bearing Restrictions: No     Mobility  Bed Mobility Overal bed mobility: Needs Assistance Bed Mobility: Supine to Sit, Sit to Supine     Supine to sit: Mod assist (To Right side of Bed) Sit to supine: Min assist   General bed mobility comments:  (c/o blurred vision and HA while sitting EOB)    Transfers                   General transfer comment:  (deferred due to low BP)    Ambulation/Gait                   Stairs             Wheelchair Mobility    Modified Rankin (Stroke Patients Only)       Balance Overall balance assessment: Needs assistance Sitting-balance support: Single extremity supported, Feet supported Sitting balance-Leahy Scale: Fair Sitting balance - Comments:  (Sitting for several minutes EOB)                                    Cognition Arousal/Alertness: Awake/alert, Lethargic Behavior During Therapy: WFL for tasks assessed/performed, Impulsive Overall Cognitive Status: Within Functional Limits for tasks assessed                                 General Comments:  (Engaged throughout session. States she was in the [employer])  Exercises      General Comments General comments (skin integrity, edema, etc.):  (Pt very pleasant and cooperative, appeared less impulsive. c/o feeling bored. R UE supported on pillow)      Pertinent Vitals/Pain Pain Assessment Pain Assessment: 0-10 Pain Score: 8  Pain Location:  (Headache) Pain Descriptors / Indicators: Headache    Home Living                          Prior Function            PT Goals (current goals can now be found in the care plan section)      Frequency    Min 4X/week      PT Plan Current plan remains appropriate    Co-evaluation               AM-PAC PT "6 Clicks" Mobility   Outcome Measure  Help needed turning from your back to your side while in a flat bed without using bedrails?: A Little Help needed moving from lying on your back to sitting on the side of a flat bed without using bedrails?: A Little Help needed moving to and from a bed to a chair (including a wheelchair)?: A Little Help needed standing up from a chair using your arms (e.g., wheelchair or bedside chair)?: A Little Help needed to walk in hospital room?: Total Help needed climbing 3-5 steps with a railing? : Total 6 Click Score: 14    End of Session   Activity Tolerance: Other (comment) (Pt limited by dizziness and HA, MD, Nursing notified) Patient left: in bed;with call bell/phone within reach;with bed alarm set Nurse Communication: Mobility status (HA, dizziness) PT Visit Diagnosis: Other abnormalities of gait and mobility (R26.89);Unsteadiness on feet (R26.81);Difficulty in walking, not elsewhere classified (R26.2);Muscle weakness (generalized) (M62.81);Other symptoms and signs involving the nervous system (R29.898);Hemiplegia and hemiparesis Hemiplegia - Right/Left: Right Hemiplegia - dominant/non-dominant: Dominant Hemiplegia - caused by: Cerebral infarction     Time: 2130-8657 PT Time Calculation (min) (ACUTE ONLY): 35 min  Charges:  $Therapeutic Exercise: 8-22 mins $Therapeutic Activity: 8-22 mins                    Zadie Cleverly, PTA  Jannet Askew 07/29/2022, 4:40 PM

## 2022-07-29 NOTE — Progress Notes (Signed)
Occupational Therapy Treatment Patient Details Name: Destiny Palmer MRN: [id number] DOB: [birth year] Today's Date: 07/29/2022   History of present illness Destiny Palmer is a 56yoF who comes to [facility] on 5/2 with AMS, BG in 600s on arrival. Code stoke called later in evening after acute onset motor deficits RUE. PMH: DM, HTN, SI, Lt shoulder dislocation s/p closed reduction 2018. MRI brain revealing "multifocal acute ischemia within the left PCA territory, including the occipital lobe, temporal lobe and thalamus."  At baseline pt denies any impairment of gait or balance, not use of AD, reports to live with mother whom she provides caregiver assistance.   OT comments   Pt seen for OT treatment on this date. Upon arrival to room pt seated in bed, agreeable to tx. Pt requires MIN A during transfer from EOB to chair.  MOD A don/doff sling seated EOB, TOTAL A don/doff socks EOB. 5x STS facilitated weight shifting to facilitate side steps with R LE. Minimal active R LE noted. Pt is making good progress toward goals, will continue to follow POC. Discharge recommendation remains appropriate.     Recommendations for follow up therapy are one component of a multi-disciplinary discharge planning process, led by the attending physician.  Recommendations may be updated based on patient status, additional functional criteria and insurance authorization.    Assistance Recommended at Discharge Frequent or constant Supervision/Assistance  Patient can return home with the following  Two people to help with bathing/dressing/bathroom;A lot of help with walking and/or transfers;Help with stairs or ramp for entrance   Equipment Recommendations   (Defer)    Recommendations for Other Services      Precautions / Restrictions Precautions Precautions: Fall Precaution Comments: hemi RUE Required Braces or Orthoses: Sling Restrictions Weight Bearing Restrictions: No       Mobility Bed Mobility Overal bed  mobility: Needs Assistance Bed Mobility: Supine to Sit, Sit to Supine     Supine to sit: Mod assist Sit to supine: Min assist   General bed mobility comments: transitioned to sitting up on R side of bed. Pt with improved sitting balance and follows commands well.    Transfers Overall transfer level: Needs assistance Equipment used: 1 person hand held assist Transfers: Sit to/from Stand, Bed to chair/wheelchair/BSC Sit to Stand: Min assist Stand pivot transfers: Min assist         General transfer comment: R knee block, cueing righting once standing     Balance Overall balance assessment: Needs assistance Sitting-balance support: Single extremity supported, Feet unsupported Sitting balance-Leahy Scale: Fair     Standing balance support: Single extremity supported Standing balance-Leahy Scale: Poor                             ADL either performed or assessed with clinical judgement   ADL Overall ADL's : Needs assistance/impaired                                       General ADL Comments: MOD A don/doff sling seated EOB, TOTAL A don/doff socks EOB.       Cognition Arousal/Alertness: Awake/alert, Lethargic Behavior During Therapy: WFL for tasks assessed/performed, Impulsive Overall Cognitive Status: Within Functional Limits for tasks assessed  General Comments: impulsive, improves with cues, BGL elevated suspect impacting arousal.        Exercises Other Exercises Other Exercises: 5x STS facilitated weight shifting to facilitate side steps with R LE. Minimal active R LE noted.    Shoulder Instructions       General Comments      Pertinent Vitals/ Pain       Pain Assessment Pain Assessment: No/denies pain  Home Living                                          Prior Functioning/Environment              Frequency  Min 3X/week        Progress Toward  Goals  OT Goals(current goals can now be found in the care plan section)  Progress towards OT goals: Progressing toward goals  Acute Rehab OT Goals Patient Stated Goal: to return to PLOF OT Goal Formulation: With patient Time For Goal Achievement: 08/06/22 Potential to Achieve Goals: Good ADL Goals Pt Will Perform Grooming: with min assist;sitting Pt Will Perform Upper Body Dressing: with supervision;sitting Pt Will Transfer to Toilet: with min assist;stand pivot transfer;bedside commode Pt/caregiver will Perform Home Exercise Program: Increased strength;Right Upper extremity;Independently  Plan Discharge plan remains appropriate;Frequency remains appropriate    Co-evaluation                 AM-PAC OT "6 Clicks" Daily Activity     Outcome Measure   Help from another person eating meals?: A Little Help from another person taking care of personal grooming?: A Lot Help from another person toileting, which includes using toliet, bedpan, or urinal?: A Lot Help from another person bathing (including washing, rinsing, drying)?: A Lot Help from another person to put on and taking off regular upper body clothing?: A Lot Help from another person to put on and taking off regular lower body clothing?: A Lot 6 Click Score: 13    End of Session Equipment Utilized During Treatment: Gait belt  OT Visit Diagnosis: Other abnormalities of gait and mobility (R26.89);Muscle weakness (generalized) (M62.81)   Activity Tolerance Patient tolerated treatment well (elevated BGL)   Patient Left in bed;with call bell/phone within reach;with bed alarm set;with family/visitor present   Nurse Communication          Time: 1610-9604 OT Time Calculation (min): 31 min  Charges: OT General Charges $OT Visit: 1 Visit OT Treatments $Self Care/Home Management : 8-22 mins $Neuromuscular Re-education: 8-22 mins Thresa Ross, OTS

## 2022-07-29 NOTE — Inpatient Diabetes Management (Signed)
Inpatient Diabetes Program Recommendations  AACE/ADA: New Consensus Statement on Inpatient Glycemic Control (2015)  Target Ranges:  Prepandial:   less than 140 mg/dL      Peak postprandial:   less than 180 mg/dL (1-2 hours)      Critically ill patients:  140 - 180 mg/dL    Latest Reference Range & Units 07/28/22 08:22 07/28/22 11:52 07/28/22 17:20 07/28/22 18:37 07/28/22 20:36 07/28/22 22:39  Glucose-Capillary 70 - 99 mg/dL 540 (H)  8 units Novolog  274 (H)  13 units Novolog @1317  197 (H)  8 units Novolog  311 (H) 221 (H) 210 (H)  2 units Novolog  23 units Semglee  (H): Data is abnormally high  Latest Reference Range & Units 07/29/22 08:14 07/29/22 11:41  Glucose-Capillary 70 - 99 mg/dL 981 (H)  7 units Novolog  340 (H)  16 units Novolog   (H): Data is abnormally high    Home DM Meds: Novolog 0-15 units TID                             Novolog 3 units TID                             Lantus 26 units Daily                             (NOT taking Insulin per Home Med Rec)   Current Orders: Novolog Moderate Correction Scale/ SSI (0-15 units) TID AC + HS      Novolog 5 units TID with meals                           Semglee 23 units QHS    MD- Please consider increasing the Novolog Meal Coverage to 7 units TID with meals   --Will follow patient during hospitalization--  Ambrose Finland RN, MSN, CDCES Diabetes Coordinator Inpatient Glycemic Control Team Team Pager: [phone] (8a-5p)

## 2022-07-30 LAB — GLUCOSE, CAPILLARY
Glucose-Capillary: 152 mg/dL — ABNORMAL HIGH (ref 70–99)
Glucose-Capillary: 231 mg/dL — ABNORMAL HIGH (ref 70–99)
Glucose-Capillary: 254 mg/dL — ABNORMAL HIGH (ref 70–99)
Glucose-Capillary: 329 mg/dL — ABNORMAL HIGH (ref 70–99)

## 2022-07-30 MED ORDER — EZETIMIBE 10 MG PO TABS
10.0000 mg | ORAL_TABLET | Freq: Every day | ORAL | Status: DC
  Administered 2022-07-30 – 2022-08-03 (×5): 10 mg via ORAL
  Filled 2022-07-30 (×5): qty 1

## 2022-07-30 MED ORDER — INSULIN GLARGINE-YFGN 100 UNIT/ML ~~LOC~~ SOLN
27.0000 [IU] | Freq: Every day | SUBCUTANEOUS | Status: DC
  Administered 2022-07-30 – 2022-08-02 (×4): 27 [IU] via SUBCUTANEOUS
  Filled 2022-07-30 (×5): qty 0.27

## 2022-07-30 NOTE — Progress Notes (Signed)
PROGRESS NOTE  Destiny Palmer  GNF:[id number] DOB: 1965-12-21 DOA: 07/22/2022 PCP: [facility]   Brief Narrative: Patient is a 57 year old female with history of diabetes type 2, adjustment disorder, PTSD presented with confusion.  On presentation she was found to be lethargic, nauseated and vomiting.  UDS was negative.  Patient noted to have slurred speech, difficulty walking and progressively became worse and after admission.  Stroke alert was called.  MRI showed large multifocal acute infarct in the PCA territory.  Neurology consulted.  Started on dual antiplatelet therapy.  PT/OT recommending acute inpatient rehab.  Waiting for placement.  Assessment & Plan:  Principal Problem:   Acute metabolic encephalopathy Active Problems:   Cerebrovascular accident (CVA) (HCC)   Acute metabolic encephalopathy secondary to CVA: Remains confused but  alert and awake.  Follows commands.  Acute CVA:  MRI showed large multifocal acute infarct in the PCA territory.  Neurology consulted.  Started on dual antiplatelet therapy.  Neurology recommended aspirin and Plavix for 21 days followed by aspirin monotherapy.  Continue statin. PT/OT recommending acute inpatient rehab.  Needs to follow-up with neurology as an outpatient  Insulin-dependent diabetes type 2 with hyperglycemia: Uncontrolled.  A1c of 14.5.  Continue current insulin regimen.  Monitor blood sugars.  Diabetic coordinator  following  Hypertension: Currently blood pressure stable.  Monitor  Hyperlipidemia: On Lipitor.LDL of 158  Depression/adjustment disorder: On Prozac, as needed Atarax            DVT prophylaxis:enoxaparin (LOVENOX) injection 40 mg Start: 07/22/22 2200     Code Status: Full Code  Family Communication: None at the bedside  Patient status:Inpatient  Patient is from :Home  Anticipated discharge to:[facility]  Estimated DC date:not sure   Consultants: Neurology  Procedures:None  Antimicrobials:   Anti-infectives (From admission, onward)    Start     Dose/Rate Route Frequency Ordered Stop   07/22/22 1715  cefTRIAXone (ROCEPHIN) 1 g in sodium chloride 0.9 % 100 mL IVPB        1 g 200 mL/hr over 30 Minutes Intravenous  Once 07/22/22 1713 07/22/22 1905       Subjective: Patient seen and examined at bedside today.  Hemodynamically stable.  Comfortably lying in bed.  Denies any complaints.  Alert and awake but confused to time and place.  Speech is also found to be dysarthric.  Objective: Vitals:   07/29/22 1953 07/29/22 2324 07/30/22 0234 07/30/22 0902  BP: (!) 100/55 99/63 117/83 103/65  Pulse: 73 80 72 77  Resp: 16 16 20 18   Temp: 98.6 F (37 C) 98.4 F (36.9 C) 98.1 F (36.7 C) 97.8 F (36.6 C)  TempSrc:      SpO2: 97% 98% 99% 100%  Weight:      Height:        Intake/Output Summary (Last 24 hours) at 07/30/2022 0939 Last data filed at 07/29/2022 2251 Gross per 24 hour  Intake 680 ml  Output 600 ml  Net 80 ml   Filed Weights   07/22/22 1700 07/27/22 0439 07/28/22 0451  Weight: 68.9 kg 70.1 kg 67.8 kg    Examination:  General exam: Overall comfortable, not in distress HEENT: PERRL Respiratory system:  no wheezes or crackles  Cardiovascular system: S1 & S2 heard, RRR.  Gastrointestinal system: Abdomen is nondistended, soft and nontender. Central nervous system: Alert and awake, follows commands but confused to time, right-sided hemiparesis, dysarthria Extremities: No edema, no clubbing ,no cyanosis Skin: No rashes, no ulcers,no icterus  Data Reviewed: I have personally reviewed following labs and imaging studies  CBC: Recent Labs  Lab 07/29/22 0412  WBC 6.7  HGB 14.6  HCT 43.1  MCV 90.4  PLT 251   Basic Metabolic Panel: Recent Labs  Lab 07/29/22 0412  CREATININE 0.59     Recent Results (from the past 240 hour(s))  Urine Culture     Status: Abnormal   Collection Time: 07/22/22  1:07 PM   Specimen: Urine, Clean Catch  Result Value Ref  Range Status   Specimen Description   Final    URINE, CLEAN CATCH Performed at [facility], [address] [place], [place] [address]    Special Requests   Final    NONE Performed at [facility], [address] [place], [place] [address]    Culture (A)  Final    40,000 COLONIES/mL STREPTOCOCCUS AGALACTIAE TESTING AGAINST S. AGALACTIAE NOT ROUTINELY PERFORMED DUE TO PREDICTABILITY OF AMP/PEN/VAN SUSCEPTIBILITY. Performed at [facility], [address], [place], [place] [address]    Report Status 07/23/2022 FINAL  Final  Blood culture (routine x 2)     Status: None   Collection Time: 07/22/22  5:48 PM   Specimen: BLOOD  Result Value Ref Range Status   Specimen Description BLOOD RIGHT ANTECUBITAL  Final   Special Requests   Final    BOTTLES DRAWN AEROBIC AND ANAEROBIC Blood Culture adequate volume   Culture   Final    NO GROWTH 5 DAYS Performed at [facility], [address] [place], [place] [address]    Report Status 07/27/2022 FINAL  Final  Blood culture (routine x 2)     Status: None   Collection Time: 07/22/22  5:55 PM   Specimen: BLOOD  Result Value Ref Range Status   Specimen Description BLOOD LEFT ANTECUBITAL  Final   Special Requests   Final    BOTTLES DRAWN AEROBIC AND ANAEROBIC Blood Culture adequate volume   Culture   Final    NO GROWTH 5 DAYS Performed at [facility], [address] [place], [place] [address]    Report Status 07/27/2022 FINAL  Final     Radiology Studies: No results found.  Scheduled Meds:   stroke: early stages of recovery book   Does not apply Once   aspirin  81 mg Oral Daily   atorvastatin  40 mg Oral Daily   clopidogrel  75 mg Oral Daily   enoxaparin (LOVENOX) injection  40 mg Subcutaneous Q24H   FLUoxetine  40 mg Oral Daily   insulin aspart  0-15 Units Subcutaneous TID WC   insulin aspart  7 Units Subcutaneous TID WC   insulin glargine-yfgn  23 Units Subcutaneous QHS    tamsulosin  0.4 mg Oral Daily   Continuous Infusions:   LOS: 7 days   Burnadette Pop, MD [facility] P5/12/2022, 9:39 AM

## 2022-07-30 NOTE — Progress Notes (Signed)
Occupational Therapy Treatment Patient Details Name: ELAINA TAING MRN: [id number] DOB: 1965/09/12 Today's Date: 07/30/2022   History of present illness Kaydience Caudle is a 56yoF who comes to [facility] on 5/2 with AMS, BG in 600s on arrival. Code stoke called later in evening after acute onset motor deficits RUE. PMH: DM, HTN, SI, Lt shoulder dislocation s/p closed reduction 2018. MRI brain revealing "multifocal acute ischemia within the left PCA territory, including the occipital lobe, temporal lobe and thalamus."  At baseline pt denies any impairment of gait or balance, not use of AD, reports to live with mother whom she provides caregiver assistance.   OT comments  Ms Tivis was seen for OT treatment on this date. Upon arrival to room pt seated in chair, agreeable to tx. Pt does not have sling in room, RN ordered a new sling for next session. OT facilitated x3 trials walking ~6 feet with MAX A + LUE support on windowsil and +2 for safety/chair follow. Required R knee block, assist to facilitate RLE advancement, and cues for sequencing. Continue to note 2-/5 in RLE with mobility attempts. Returned to bed MOD A. Pt making progress toward goals, will continue to follow POC. Discharge recommendation remains appropriate.     Recommendations for follow up therapy are one component of a multi-disciplinary discharge planning process, led by the attending physician.  Recommendations may be updated based on patient status, additional functional criteria and insurance authorization.    Assistance Recommended at Discharge Frequent or constant Supervision/Assistance  Patient can return home with the following  Two people to help with bathing/dressing/bathroom;A lot of help with walking and/or transfers;Help with stairs or ramp for entrance   Equipment Recommendations  Other (comment) (defer)    Recommendations for Other Services      Precautions / Restrictions Precautions Precautions: Fall Precaution  Comments: hemi RUE Required Braces or Orthoses: Sling Restrictions Weight Bearing Restrictions: No       Mobility Bed Mobility Overal bed mobility: Needs Assistance Bed Mobility: Sit to Supine       Sit to supine: Mod assist   General bed mobility comments: initiation of RLE to return to bed noted    Transfers Overall transfer level: Needs assistance Equipment used: 1 person hand held assist (or windowsill for LUE support) Transfers: Sit to/from Stand, Bed to chair/wheelchair/BSC Sit to Stand: Min assist Stand pivot transfers: Mod assist               Balance Overall balance assessment: Needs assistance Sitting-balance support: Single extremity supported, Feet supported Sitting balance-Leahy Scale: Fair     Standing balance support: Single extremity supported Standing balance-Leahy Scale: Poor                             ADL either performed or assessed with clinical judgement   ADL Overall ADL's : Needs assistance/impaired                                       General ADL Comments: MOD A simulated BSC t/f      Cognition Arousal/Alertness: Awake/alert Behavior During Therapy: WFL for tasks assessed/performed Overall Cognitive Status: Within Functional Limits for tasks assessed  General Comments: impulsive however improved with cues. aphasia        Exercises Other Exercises Other Exercises: Facilitated x3 trials walking ~6 feet with MAX A + LUE support on windowsil and +2 for safety/chair follow. Required R knee block, assist to facilitate RLE advancement, and cues for sequencing.            Pertinent Vitals/ Pain       Pain Assessment Pain Assessment: No/denies pain   Frequency  Min 3X/week        Progress Toward Goals  OT Goals(current goals can now be found in the care plan section)  Progress towards OT goals: Progressing toward goals  Acute Rehab OT  Goals Patient Stated Goal: to walk OT Goal Formulation: With patient Time For Goal Achievement: 08/06/22 Potential to Achieve Goals: Good ADL Goals Pt Will Perform Grooming: with min assist;sitting Pt Will Perform Upper Body Dressing: with supervision;sitting Pt Will Transfer to Toilet: with min assist;stand pivot transfer;bedside commode Pt/caregiver will Perform Home Exercise Program: Increased strength;Right Upper extremity;Independently  Plan Discharge plan remains appropriate;Frequency remains appropriate    Co-evaluation                 AM-PAC OT "6 Clicks" Daily Activity     Outcome Measure   Help from another person eating meals?: A Little Help from another person taking care of personal grooming?: A Little Help from another person toileting, which includes using toliet, bedpan, or urinal?: A Lot Help from another person bathing (including washing, rinsing, drying)?: A Lot Help from another person to put on and taking off regular upper body clothing?: A Lot Help from another person to put on and taking off regular lower body clothing?: A Lot 6 Click Score: 14    End of Session Equipment Utilized During Treatment: Gait belt  OT Visit Diagnosis: Other abnormalities of gait and mobility (R26.89);Muscle weakness (generalized) (M62.81)   Activity Tolerance Patient tolerated treatment well   Patient Left in bed;with call bell/phone within reach;with bed alarm set;with nursing/sitter in room   Nurse Communication Mobility status        Time: 2956-2130 OT Time Calculation (min): 23 min  Charges: OT General Charges $OT Visit: 1 Visit OT Treatments $Self Care/Home Management : 8-22 mins $Neuromuscular Re-education: 8-22 mins  Kathie Dike, M.S. OTR/L  07/30/22, 3:26 PM  ascom [phone]

## 2022-07-30 NOTE — TOC Progression Note (Addendum)
Transition of Care [facility]) - Progression Note    Patient Details  Name: Destiny Palmer MRN: [id number] Date of Birth: June 07, 1965  Transition of Care [facility]) CM/SW Contact  Liliana Cline, LCSW Phone Number: 07/30/2022, 10:40 AM  Clinical Narrative:    Per weekday [facility], she sent information to [facility] for SNF approval. CSW reached out to [place] with [facility] requesting an update.  CSW also reached out to Gavin Pound at [facility] to inquire if they can offer a bed per family request.   1:58- [facility], [facility] still [facility].  Reached out to [employer] with [facility] again to inquire if they can make a bed offer.   3:28- Per OT, patient would benefit from inpatient rehab. CSW asked Lynden Ang with the [facility] if patient's VA benefits would cover anywhere other than CIR. Per [facility], the [place] does cover CIR but "we are already processing Ms. Ungar for the SNF level of care, so we won't switch gears to review acute rehab for her." Per CIR notes "Per Beverely Low, pt does not have someone that could provide that level of care for her at discharge and wishes to continue pursuing SNF for longer term rehab." Updated OT.   3:45- Per Gavin Pound with [facility], she is waiting for her DON to review and also to verify insurance to see if they can make a bed offer.   Expected Discharge Plan: Skilled Nursing Facility Barriers to Discharge: Continued Medical Work up  Expected Discharge Plan and Services       Living arrangements for the past 2 months: Single Family Home                                       Social Determinants of Health (SDOH) Interventions    Readmission Risk Interventions     No data to display

## 2022-07-30 NOTE — Progress Notes (Signed)
Physical Therapy Treatment Patient Details Name: Destiny Palmer MRN: [id number] DOB: 1965-07-11 Today's Date: 07/30/2022   History of Present Illness Destiny Palmer is a 56yoF who comes to [facility] on 5/2 with AMS, BG in 600s on arrival. Code stoke called later in evening after acute onset motor deficits RUE. PMH: DM, HTN, SI, Lt shoulder dislocation s/p closed reduction 2018. MRI brain revealing "multifocal acute ischemia within the left PCA territory, including the occipital lobe, temporal lobe and thalamus."  At baseline pt denies any impairment of gait or balance, not use of AD, reports to live with mother whom she provides caregiver assistance.    PT Comments    Pt received in bed, no c/o dizziness sitting EOB this date. Min A to stand from chair, ModA to stand pivot to chair on Left side, R knee blocked to prevent buckling. Pt positioned to comfort in chair with chair alarm on. Great tolerance for session, will continue to progress per POC.   Recommendations for follow up therapy are one component of a multi-disciplinary discharge planning process, led by the attending physician.  Recommendations may be updated based on patient status, additional functional criteria and insurance authorization.  Follow Up Recommendations  Can patient physically be transported by private vehicle: No    Assistance Recommended at Discharge Intermittent Supervision/Assistance  Patient can return home with the following Two people to help with walking and/or transfers;Two people to help with bathing/dressing/bathroom;Help with stairs or ramp for entrance;Assist for transportation   Equipment Recommendations  None recommended by PT    Recommendations for Other Services       Precautions / Restrictions Precautions Precautions: Fall Precaution Comments: hemi RUE Required Braces or Orthoses: Sling Restrictions Weight Bearing Restrictions: No     Mobility  Bed Mobility Overal bed mobility: Needs  Assistance Bed Mobility: Supine to Sit, Sit to Supine Rolling: Min assist (To R side)   Supine to sit: Mod assist     General bed mobility comments:  (No c/o dizziness in sitting)    Transfers Overall transfer level: Needs assistance Equipment used: None Transfers: Sit to/from Stand Sit to Stand: Min assist Stand pivot transfers: Mod assist (R knee blocked, ModA to control impulsivity)        Lateral/Scoot Transfers: Min assist General transfer comment: R knee block, cueing righting once standing    Ambulation/Gait                   Stairs             Wheelchair Mobility    Modified Rankin (Stroke Patients Only)       Balance                                            Cognition Arousal/Alertness: Awake/alert, Lethargic Behavior During Therapy: WFL for tasks assessed/performed, Impulsive Overall Cognitive Status: Within Functional Limits for tasks assessed                                 General Comments: impulsive, improves with cues, BGL elevated suspect impacting arousal.        Exercises General Exercises - Lower Extremity Ankle Circles/Pumps: Right, PROM, 10 reps Heel Slides: Right, PROM, 10 reps Hip ABduction/ADduction: Right, PROM, 10 reps    General Comments  Pertinent Vitals/Pain Pain Assessment Pain Assessment: No/denies pain    Home Living                          Prior Function            PT Goals (current goals can now be found in the care plan section) Acute Rehab PT Goals Patient Stated Goal: to get up to the chair Progress towards PT goals: Progressing toward goals    Frequency    Min 4X/week      PT Plan Current plan remains appropriate    Co-evaluation              AM-PAC PT "6 Clicks" Mobility   Outcome Measure  Help needed turning from your back to your side while in a flat bed without using bedrails?: A Little Help needed moving from lying  on your back to sitting on the side of a flat bed without using bedrails?: A Little Help needed moving to and from a bed to a chair (including a wheelchair)?: A Little Help needed standing up from a chair using your arms (e.g., wheelchair or bedside chair)?: A Little Help needed to walk in hospital room?: Total Help needed climbing 3-5 steps with a railing? : Total 6 Click Score: 14    End of Session Equipment Utilized During Treatment: Gait belt Activity Tolerance: Patient tolerated treatment well Patient left: in chair;with call bell/phone within reach;with chair alarm set Nurse Communication: Mobility status PT Visit Diagnosis: Other abnormalities of gait and mobility (R26.89);Unsteadiness on feet (R26.81);Difficulty in walking, not elsewhere classified (R26.2);Muscle weakness (generalized) (M62.81);Other symptoms and signs involving the nervous system (R29.898);Hemiplegia and hemiparesis Hemiplegia - Right/Left: Right Hemiplegia - dominant/non-dominant: Dominant Hemiplegia - caused by: Cerebral infarction     Time: 1336-1400 PT Time Calculation (min) (ACUTE ONLY): 24 min  Charges:  $Therapeutic Exercise: 8-22 mins $Therapeutic Activity: 8-22 mins                    Zadie Cleverly, PTA   Jannet Askew 07/30/2022, 2:07 PM

## 2022-07-31 DIAGNOSIS — I639 Cerebral infarction, unspecified: Secondary | ICD-10-CM

## 2022-07-31 LAB — CBC
HCT: 43.8 % (ref 36.0–46.0)
Hemoglobin: 14.7 g/dL (ref 12.0–15.0)
MCH: 30.3 pg (ref 26.0–34.0)
MCHC: 33.6 g/dL (ref 30.0–36.0)
MCV: 90.3 fL (ref 80.0–100.0)
Platelets: 241 10*3/uL (ref 150–400)
RBC: 4.85 MIL/uL (ref 3.87–5.11)
RDW: 11.9 % (ref 11.5–15.5)
WBC: 7 10*3/uL (ref 4.0–10.5)
nRBC: 0 % (ref 0.0–0.2)

## 2022-07-31 LAB — GLUCOSE, CAPILLARY
Glucose-Capillary: 185 mg/dL — ABNORMAL HIGH (ref 70–99)
Glucose-Capillary: 181 mg/dL — ABNORMAL HIGH (ref 70–99)
Glucose-Capillary: 317 mg/dL — ABNORMAL HIGH (ref 70–99)
Glucose-Capillary: 183 mg/dL — ABNORMAL HIGH (ref 70–99)

## 2022-07-31 LAB — BASIC METABOLIC PANEL
Anion gap: 7 (ref 5–15)
BUN: 14 mg/dL (ref 6–20)
CO2: 27 mmol/L (ref 22–32)
Calcium: 8.8 mg/dL — ABNORMAL LOW (ref 8.9–10.3)
Chloride: 103 mmol/L (ref 98–111)
Creatinine, Ser: 0.54 mg/dL (ref 0.44–1.00)
GFR, Estimated: >60 mL/min (ref >60)
Glucose, Bld: 197 mg/dL — ABNORMAL HIGH (ref 70–99)
Potassium: 3.9 mmol/L (ref 3.5–5.1)
Sodium: 137 mmol/L (ref 135–145)

## 2022-07-31 NOTE — Progress Notes (Signed)
PROGRESS NOTE  Destiny Palmer  GNF:[id number] DOB: 1965-09-27 DOA: 07/22/2022 PCP: [facility]   Brief Narrative: Patient is a 57 year old female with history of diabetes type 2, adjustment disorder, PTSD presented with confusion.  On presentation she was found to be lethargic, nauseated and vomiting.  UDS was negative.  Patient noted to have slurred speech, difficulty walking and progressively became worse and after admission.  Stroke alert was called.  MRI showed large multifocal acute infarct in the PCA territory.  Neurology consulted.  Started on dual antiplatelet therapy.  PT/OT recommending acute inpatient rehab.  Waiting for placement.  Assessment & Plan:  Principal Problem:   Acute metabolic encephalopathy Active Problems:   Cerebrovascular accident (CVA) (HCC)   Acute metabolic encephalopathy secondary to CVA: Remains confused but  alert and awake.  Follows commands.  Acute CVA:  MRI showed large multifocal acute infarct in the PCA territory.  Neurology consulted.  Started on dual antiplatelet therapy.  Neurology recommended aspirin and Plavix for 21 days followed by aspirin monotherapy.  Continue statin. PT/OT recommending acute inpatient rehab.  Needs to follow-up with neurology as an outpatient  Insulin-dependent diabetes type 2 with hyperglycemia: Uncontrolled.  A1c of 14.5.  Continue current insulin regimen.  Monitor blood sugars.  Diabetic coordinator  following  Hypertension: Currently blood pressure stable.  Monitor  Hyperlipidemia: On Lipitor.LDL of 158  Depression/adjustment disorder: On Prozac, as needed Atarax   DVT prophylaxis:enoxaparin (LOVENOX) injection 40 mg Start: 07/22/22 2200     Code Status: Full Code  Family Communication: Discussed with sister at bedside  Patient status:Inpatient  Patient is from :Home  Anticipated discharge to: SNF  Estimated DC date:not sure   Consultants: Neurology  Procedures:None  Antimicrobials:   Anti-infectives (From admission, onward)    Start     Dose/Rate Route Frequency Ordered Stop   07/22/22 1715  cefTRIAXone (ROCEPHIN) 1 g in sodium chloride 0.9 % 100 mL IVPB        1 g 200 mL/hr over 30 Minutes Intravenous  Once 07/22/22 1713 07/22/22 1905       Subjective: Patient was seen and examined today.  No new deficit.  Continue to have right hemiparesis and dysarthria.  Objective: Vitals:   07/30/22 0902 07/30/22 1556 07/30/22 2330 07/31/22 0838  BP: 103/65 107/61 120/86 97/70  Pulse: 77 71 81 77  Resp: 18 18 15 18   Temp: 97.8 F (36.6 C) 98 F (36.7 C) 98 F (36.7 C) 98 F (36.7 C)  TempSrc:  Oral  Oral  SpO2: 100% 100% 100% 100%  Weight:      Height:        Intake/Output Summary (Last 24 hours) at 07/31/2022 1514 Last data filed at 07/31/2022 1030 Gross per 24 hour  Intake 240 ml  Output --  Net 240 ml    Filed Weights   07/22/22 1700 07/27/22 0439 07/28/22 0451  Weight: 68.9 kg 70.1 kg 67.8 kg    Examination:  General.  Frail lady in no acute distress. Pulmonary.  Lungs clear bilaterally, normal respiratory effort. CV.  Regular rate and rhythm, no JVD, rub or murmur. Abdomen.  Soft, nontender, nondistended, BS positive. CNS.  Alert and oriented .  Right hemiparesis and dysarthria. Extremities.  No edema, no cyanosis, pulses intact and symmetrical. Psychiatry.  Judgment and insight appears normal.     Data Reviewed: I have personally reviewed following labs and imaging studies  CBC: Recent Labs  Lab 07/29/22 0412 07/31/22 0445  WBC 6.7 7.0  HGB 14.6 14.7  HCT 43.1 43.8  MCV 90.4 90.3  PLT 251 241    Basic Metabolic Panel: Recent Labs  Lab 07/29/22 0412 07/31/22 0445  NA  --  137  K  --  3.9  CL  --  103  CO2  --  27  GLUCOSE  --  197*  BUN  --  14  CREATININE 0.59 0.54  CALCIUM  --  8.8*      Recent Results (from the past 240 hour(s))  Urine Culture     Status: Abnormal   Collection Time: 07/22/22  1:07 PM   Specimen:  Urine, Clean Catch  Result Value Ref Range Status   Specimen Description   Final    URINE, CLEAN CATCH Performed at [facility], [address] [place], [place] [address]    Special Requests   Final    NONE Performed at [facility], [address], [place], [place] [address]    Culture (A)  Final    40,000 COLONIES/mL STREPTOCOCCUS AGALACTIAE TESTING AGAINST S. AGALACTIAE NOT ROUTINELY PERFORMED DUE TO PREDICTABILITY OF AMP/PEN/VAN SUSCEPTIBILITY. Performed at [facility], [address], [place], [place] [address]    Report Status 07/23/2022 FINAL  Final  Blood culture (routine x 2)     Status: None   Collection Time: 07/22/22  5:48 PM   Specimen: BLOOD  Result Value Ref Range Status   Specimen Description BLOOD RIGHT ANTECUBITAL  Final   Special Requests   Final    BOTTLES DRAWN AEROBIC AND ANAEROBIC Blood Culture adequate volume   Culture   Final    NO GROWTH 5 DAYS Performed at [facility], [address] [place], [place] [address]    Report Status 07/27/2022 FINAL  Final  Blood culture (routine x 2)     Status: None   Collection Time: 07/22/22  5:55 PM   Specimen: BLOOD  Result Value Ref Range Status   Specimen Description BLOOD LEFT ANTECUBITAL  Final   Special Requests   Final    BOTTLES DRAWN AEROBIC AND ANAEROBIC Blood Culture adequate volume   Culture   Final    NO GROWTH 5 DAYS Performed at [facility], [address], [place], [place] [address]    Report Status 07/27/2022 FINAL  Final     Radiology Studies: No results found.  Scheduled Meds:   stroke: early stages of recovery book   Does not apply Once   aspirin  81 mg Oral Daily   atorvastatin  40 mg Oral Daily   clopidogrel  75 mg Oral Daily   enoxaparin (LOVENOX) injection  40 mg Subcutaneous Q24H   ezetimibe  10 mg Oral Daily   FLUoxetine  40 mg Oral Daily   insulin aspart  0-15 Units Subcutaneous TID WC   insulin aspart  7 Units Subcutaneous  TID WC   insulin glargine-yfgn  27 Units Subcutaneous QHS   tamsulosin  0.4 mg Oral Daily   Continuous Infusions:   LOS: 8 days   Arnetha Courser, MD [facility] P5/01/2023, 3:14 PM

## 2022-08-01 LAB — GLUCOSE, CAPILLARY
Glucose-Capillary: 172 mg/dL — ABNORMAL HIGH (ref 70–99)
Glucose-Capillary: 180 mg/dL — ABNORMAL HIGH (ref 70–99)
Glucose-Capillary: 303 mg/dL — ABNORMAL HIGH (ref 70–99)
Glucose-Capillary: 303 mg/dL — ABNORMAL HIGH (ref 70–99)

## 2022-08-01 NOTE — Progress Notes (Signed)
Physical Therapy Treatment Patient Details Name: Destiny Palmer MRN: [id number] DOB: 19-Sep-1965 Today's Date: 08/01/2022   History of Present Illness Destiny Palmer is a 56yoF who comes to [facility] on 5/[facility] with AMS, BG in 600s on arrival. Code stoke called later in evening after acute onset motor deficits RUE. PMH: DM, HTN, SI, Lt shoulder dislocation s/p closed reduction 2018. MRI brain revealing "multifocal acute ischemia within the left PCA territory, including the occipital lobe, temporal lobe and thalamus."  At baseline pt denies any impairment of gait or balance, not use of AD, reports to live with mother whom she provides caregiver assistance.    PT Comments    Pt is making gradual progress towards goals with improvement in ability to stand with less physical assist. Still demonstrates R knee buckling and assisted with weight shifting this date. Still unable to activate R hemibody with slight small movement in shoulder on R side. Unable to locate sling for R UE. Pt reports fatigue today and requesting to return back to bed to take a nap. Will continue to progress as able.  Recommendations for follow up therapy are one component of a multi-disciplinary discharge planning process, led by the attending physician.  Recommendations may be updated based on patient status, additional functional criteria and insurance authorization.  Follow Up Recommendations  Can patient physically be transported by private vehicle: No    Assistance Recommended at Discharge Intermittent Supervision/Assistance  Patient can return home with the following Two people to help with walking and/or transfers;Two people to help with bathing/dressing/bathroom;Help with stairs or ramp for entrance;Assist for transportation   Equipment Recommendations  None recommended by PT    Recommendations for Other Services       Precautions / Restrictions Precautions Precautions: Fall Precaution Comments: hemi RUE Required  Braces or Orthoses: Sling (did not see the sling now that patient has moved rooms) Restrictions Weight Bearing Restrictions: No     Mobility  Bed Mobility Overal bed mobility: Needs Assistance Bed Mobility: Supine to Sit, Sit to Supine     Supine to sit: Mod assist Sit to supine: Mod assist   General bed mobility comments: needs heavy assist with R hemibody. Cues for hooking under ankle to assist. Once seated at EOB, needs min assist for balance, progressing to supervision    Transfers Overall transfer level: Needs assistance Equipment used: 1 person hand held assist Transfers: Sit to/from Stand, Bed to chair/wheelchair/BSC Sit to Stand: Min assist           General transfer comment: Needs R knee block, moved bed closer to counter and able to hold onto counter with L hand. Multiple bouts of standing performed.    Ambulation/Gait               General Gait Details: did not perform this date   Stairs             Wheelchair Mobility    Modified Rankin (Stroke Patients Only)       Balance Overall balance assessment: Needs assistance Sitting-balance support: Single extremity supported, Feet supported Sitting balance-Leahy Scale: Fair     Standing balance support: Single extremity supported Standing balance-Leahy Scale: Poor                              Cognition Arousal/Alertness: Awake/alert Behavior During Therapy: WFL for tasks assessed/performed Overall Cognitive Status: Within Functional Limits for tasks assessed  General Comments: impulsive and able to follow commands. Appears more flat today        Exercises Other Exercises Other Exercises: performed standing/sitting weight shifts x 5 reps each side along with elbow taps with mod/max assist for shifting and positioning. Pt fatigues with exertion. Other Exercises: Able to maintain sitting and wash face with warm wash cloth using L  hand    General Comments        Pertinent Vitals/Pain Pain Assessment Pain Assessment: No/denies pain    Home Living                          Prior Function            PT Goals (current goals can now be found in the care plan section) Acute Rehab PT Goals Patient Stated Goal: stay in the bed and take a nap PT Goal Formulation: With patient Progress towards PT goals: Progressing toward goals    Frequency    Min 4X/week      PT Plan Current plan remains appropriate    Co-evaluation              AM-PAC PT "6 Clicks" Mobility   Outcome Measure  Help needed turning from your back to your side while in a flat bed without using bedrails?: A Little Help needed moving from lying on your back to sitting on the side of a flat bed without using bedrails?: A Little Help needed moving to and from a bed to a chair (including a wheelchair)?: A Little Help needed standing up from a chair using your arms (e.g., wheelchair or bedside chair)?: A Little Help needed to walk in hospital room?: Total Help needed climbing 3-5 steps with a railing? : Total 6 Click Score: 14    End of Session Equipment Utilized During Treatment: Gait belt Activity Tolerance: Patient tolerated treatment well Patient left: in bed;with bed alarm set Nurse Communication: Mobility status PT Visit Diagnosis: Other abnormalities of gait and mobility (R26.89);Unsteadiness on feet (R26.81);Difficulty in walking, not elsewhere classified (R26.2);Muscle weakness (generalized) (M62.81);Other symptoms and signs involving the nervous system (R29.898);Hemiplegia and hemiparesis Hemiplegia - Right/Left: Right Hemiplegia - dominant/non-dominant: Dominant Hemiplegia - caused by: Cerebral infarction     Time: 6213-0865 PT Time Calculation (min) (ACUTE ONLY): 20 min  Charges:  $Therapeutic Exercise: 8-22 mins                     Elizabeth Palau, PT, DPT,  GCS [phone]    Caitlyn Buchanan 08/01/2022, 11:19 AM

## 2022-08-01 NOTE — TOC Progression Note (Addendum)
Transition of Care [facility]) - Progression Note    Patient Details  Name: Destiny Palmer MRN: [id number] Date of Birth: 1965/12/13  Transition of Care [facility]) CM/SW Contact  Liliana Cline, LCSW Phone Number: 08/01/2022, 8:55 AM  Clinical Narrative:    CSW reached out to Gavin Pound at [facility], requested update on if they can make a bed offer. Awaiting response. [facility].   3:46- Per Gavin Pound at [facility], she will not know until tomorrow if they are able to make a bed offer. TOC handoff updated.   Expected Discharge Plan: Skilled Nursing Facility Barriers to Discharge: Continued Medical Work up  Expected Discharge Plan and Services       Living arrangements for the past 2 months: Single Family Home                                       Social Determinants of Health (SDOH) Interventions    Readmission Risk Interventions     No data to display

## 2022-08-01 NOTE — Plan of Care (Signed)

## 2022-08-01 NOTE — Progress Notes (Signed)
SLP Cancellation Note  Patient Details Name: Destiny Palmer MRN: [id number] DOB: [birth year]   Cancelled treatment:       Reason Eval/Treat Not Completed: Patient declined, no reason specified. Pt sleeping soundly upon SLP entrance to room. When SLP woke pt, pt stating she would like to sleep. Will continue efforts as appropriate.  [name], M.S., CCC-SLP Speech-Language Pathologist [facility] [phone] (ASCOM)  Woodroe Chen 08/01/2022, 9:08 AM

## 2022-08-01 NOTE — Progress Notes (Signed)
PROGRESS NOTE  Destiny Palmer  ZOX:[id number] DOB: 05-26-1965 DOA: 07/22/2022 [facility]: [facility]   Brief Narrative: Patient is a 57 year old female with history of diabetes type 2, adjustment disorder, PTSD presented with confusion.  On presentation she was found to be lethargic, nauseated and vomiting.  UDS was negative.  Patient noted to have slurred speech, difficulty walking and progressively became worse and after admission.  Stroke alert was called.  MRI showed large multifocal acute infarct in the PCA territory.  Neurology consulted.  Started on dual antiplatelet therapy.  PT/OT recommending acute inpatient rehab.  Waiting for placement.  5/12: Patient remained stable, still awaiting insurance authorization.  Assessment & Plan:  Principal Problem:   Acute metabolic encephalopathy Active Problems:   Cerebrovascular accident (CVA) (HCC)   Acute metabolic encephalopathy secondary to CVA: Remains confused but  alert and awake.  Follows commands.  Acute CVA:  MRI showed large multifocal acute infarct in the PCA territory.  Neurology consulted.  Started on dual antiplatelet therapy.  Neurology recommended aspirin and Plavix for 21 days followed by aspirin monotherapy.  Continue statin. PT/OT recommending acute inpatient rehab.  Needs to follow-up with neurology as an outpatient  Insulin-dependent diabetes type 2 with hyperglycemia: Uncontrolled.  A1c of 14.5.  Continue current insulin regimen.  Monitor blood sugars.  Diabetic coordinator  following  Hypertension: Currently blood pressure stable.  Monitor  Hyperlipidemia: On Lipitor.LDL of 158  Depression/adjustment disorder: On Prozac, as needed Atarax   DVT prophylaxis:enoxaparin (LOVENOX) injection 40 mg Start: 07/22/22 2200     Code Status: Full Code  Family Communication: Discussed with sister at bedside  Patient status:Inpatient  Patient is from :Home  Anticipated discharge to: SNF  Estimated DC date:not  sure   Consultants: Neurology  Procedures:None  Antimicrobials:  Anti-infectives (From admission, onward)    Start     Dose/Rate Route Frequency Ordered Stop   07/22/22 1715  cefTRIAXone (ROCEPHIN) 1 g in sodium chloride 0.9 % 100 mL IVPB        1 g 200 mL/hr over 30 Minutes Intravenous  Once 07/22/22 1713 07/22/22 1905       Subjective: Patient was seen and examined today.  No new concern.  Objective: Vitals:   07/31/22 1618 07/31/22 2338 08/01/22 0801 08/01/22 1327  BP: 112/71 111/66 114/76 100/69  Pulse: 75 68 71 66  Resp: 18     Temp: (!) 97.5 F (36.4 C) 97.8 F (36.6 C)  98.1 F (36.7 C)  TempSrc: Oral     SpO2: 96% 100% 98% 98%  Weight:      Height:        Intake/Output Summary (Last 24 hours) at 08/01/2022 1509 Last data filed at 08/01/2022 1301 Gross per 24 hour  Intake 240 ml  Output 1350 ml  Net -1110 ml    Filed Weights   07/22/22 1700 07/27/22 0439 07/28/22 0451  Weight: 68.9 kg 70.1 kg 67.8 kg    Examination:  General.  Well-developed lady, in no acute distress. Pulmonary.  Lungs clear bilaterally, normal respiratory effort. CV.  Regular rate and rhythm, no JVD, rub or murmur. Abdomen.  Soft, nontender, nondistended, BS positive. CNS.  Alert and oriented .  Right hemiparesis with dysarthria Extremities.  No edema, no cyanosis, pulses intact and symmetrical. Psychiatry.  Judgment and insight appears normal.   Data Reviewed: I have personally reviewed following labs and imaging studies  CBC: Recent Labs  Lab 07/29/22 0412 07/31/22 0445  WBC 6.7 7.0  HGB  14.6 14.7  HCT 43.1 43.8  MCV 90.4 90.3  PLT 251 241    Basic Metabolic Panel: Recent Labs  Lab 07/29/22 0412 07/31/22 0445  NA  --  137  K  --  3.9  CL  --  103  CO2  --  27  GLUCOSE  --  197*  BUN  --  14  CREATININE 0.59 0.54  CALCIUM  --  8.8*      Recent Results (from the past 240 hour(s))  Blood culture (routine x 2)     Status: None   Collection Time:  07/22/22  5:48 PM   Specimen: BLOOD  Result Value Ref Range Status   Specimen Description BLOOD RIGHT ANTECUBITAL  Final   Special Requests   Final    BOTTLES DRAWN AEROBIC AND ANAEROBIC Blood Culture adequate volume   Culture   Final    NO GROWTH 5 DAYS Performed at [facility], [address] [place], [place] [address]    Report Status 07/27/2022 FINAL  Final  Blood culture (routine x 2)     Status: None   Collection Time: 07/22/22  5:55 PM   Specimen: BLOOD  Result Value Ref Range Status   Specimen Description BLOOD LEFT ANTECUBITAL  Final   Special Requests   Final    BOTTLES DRAWN AEROBIC AND ANAEROBIC Blood Culture adequate volume   Culture   Final    NO GROWTH 5 DAYS Performed at [facility], [address] [place], [place] [address]    Report Status 07/27/2022 FINAL  Final     Radiology Studies: No results found.  Scheduled Meds:   stroke: early stages of recovery book   Does not apply Once   aspirin  81 mg Oral Daily   atorvastatin  40 mg Oral Daily   clopidogrel  75 mg Oral Daily   enoxaparin (LOVENOX) injection  40 mg Subcutaneous Q24H   ezetimibe  10 mg Oral Daily   FLUoxetine  40 mg Oral Daily   insulin aspart  0-15 Units Subcutaneous TID WC   insulin aspart  7 Units Subcutaneous TID WC   insulin glargine-yfgn  27 Units Subcutaneous QHS   tamsulosin  0.4 mg Oral Daily   Continuous Infusions:   LOS: 9 days   Arnetha Courser, MD [facility] P5/02/2023, 3:09 PM

## 2022-08-02 LAB — GLUCOSE, CAPILLARY
Glucose-Capillary: 190 mg/dL — ABNORMAL HIGH (ref 70–99)
Glucose-Capillary: 148 mg/dL — ABNORMAL HIGH (ref 70–99)
Glucose-Capillary: 229 mg/dL — ABNORMAL HIGH (ref 70–99)
Glucose-Capillary: 255 mg/dL — ABNORMAL HIGH (ref 70–99)

## 2022-08-02 NOTE — TOC Progression Note (Signed)
Transition of Care [facility]) - Progression Note    Patient Details  Name: Destiny Palmer MRN: [id number] Date of Birth: 1965/04/28  [facility]) CM/SW Contact  Marlowe Sax, RN Phone Number: 08/02/2022, 1:33 PM  Clinical Narrative:    Spoke with the patient about going to [place] rehab, she is agreeable, I attempted to reach [place] the patient's sister, and was unable to reach, left a VM, Attempted to reach Amber the patient's niece, ;left a VM asking for a call back, [facility] [employer] [facility]   Expected Discharge Plan: Skilled Nursing Facility Barriers to Discharge: SNF Pending bed offer, Insurance Authorization  Expected Discharge Plan and Services       Living arrangements for the past 2 months: Single Family Home                                       Social Determinants of Health (SDOH) Interventions    Readmission Risk Interventions     No data to display

## 2022-08-02 NOTE — Plan of Care (Signed)

## 2022-08-02 NOTE — TOC Progression Note (Signed)
Transition of Care [facility]) - Progression Note    Patient Details  Name: Destiny Palmer MRN: [id number] Date of Birth: 03-Jun-1965  Transition of Care [facility]) CM/SW Contact  Marlowe Sax, RN Phone Number: 08/02/2022, 10:41 AM  Clinical Narrative:   I reached out to Gavin Pound at [facility], requested update on if they can make a bed offer. Awaiting response. [facility].     Expected Discharge Plan: Skilled Nursing Facility Barriers to Discharge: Continued Medical Work up  Expected Discharge Plan and Services       Living arrangements for the past 2 months: Single Family Home                                       Social Determinants of Health (SDOH) Interventions    Readmission Risk Interventions     No data to display

## 2022-08-02 NOTE — Progress Notes (Signed)
Occupational Therapy Treatment Patient Details Name: Destiny Palmer MRN: [id number] DOB: 11-18-1965 Today's Date: 08/02/2022   History of present illness Destiny Palmer is a 56yoF who comes to [facility] on 5/2 with AMS, BG in 600s on arrival. Code stoke called later in evening after acute onset motor deficits RUE. PMH: DM, HTN, SI, Lt shoulder dislocation s/p closed reduction 2018. MRI brain revealing "multifocal acute ischemia within the left PCA territory, including the occipital lobe, temporal lobe and thalamus."  At baseline pt denies any impairment of gait or balance, not use of AD, reports to live with mother whom she provides caregiver assistance.   OT comments  Patient received sitting up in recliner. Pt with overall flat affect this date and required encouragement to participate in therapy. Pt agreeable to grooming tasks and completed face washing with set up A using L hand. OT completed PROM of entire RUE and then instructed pt in self ROM exercises, tolerated well. Pt left as received with all needs in reach. Pt is making progress toward goal completion. D/C recommendation remains appropriate. OT will continue to follow acutely.    Recommendations for follow up therapy are one component of a multi-disciplinary discharge planning process, led by the attending physician.  Recommendations may be updated based on patient status, additional functional criteria and insurance authorization.    Assistance Recommended at Discharge Frequent or constant Supervision/Assistance  Patient can return home with the following  Two people to help with bathing/dressing/bathroom;Help with stairs or ramp for entrance;Assistance with cooking/housework;Assist for transportation;Two people to help with walking and/or transfers   Equipment Recommendations  Other (comment) (defer to next venue of care)    Recommendations for Other Services      Precautions / Restrictions Precautions Precautions: Fall Precaution  Comments: R hemi Required Braces or Orthoses: Sling (for RUE support when OOB) Restrictions Weight Bearing Restrictions: No       Mobility Bed Mobility Overal bed mobility: Needs Assistance       General bed mobility comments: NT, pt received/left sitting in recliner    Transfers Overall transfer level: Needs assistance         General transfer comment: pt deferred transferring back to bed      Balance Overall balance assessment: Needs assistance Sitting-balance support: Single extremity supported, Feet supported Sitting balance-Leahy Scale: Fair         ADL either performed or assessed with clinical judgement   ADL Overall ADL's : Needs assistance/impaired     Grooming: Set up;Supervision/safety;Sitting;Wash/dry face Grooming Details (indicate cue type and reason): using L hand      Extremity/Trunk Assessment              Vision Baseline Vision/History: 1 Wears glasses Patient Visual Report: No change from baseline     Perception     Praxis      Cognition Arousal/Alertness: Awake/alert Behavior During Therapy: Flat affect Overall Cognitive Status: Impaired/Different from baseline Area of Impairment: Attention, Memory, Following commands, Safety/judgement, Awareness, Problem solving       Memory: Decreased short-term memory, Decreased recall of precautions Following Commands: Follows one step commands inconsistently, Follows one step commands with increased time Safety/Judgement: Decreased awareness of safety, Decreased awareness of deficits Awareness: Intellectual Problem Solving: Slow processing, Decreased initiation, Difficulty sequencing, Requires verbal cues, Requires tactile cues General Comments: Pt with overall flat affect, stating "I'm done with therapy today" initally, required encouragement to participat e        Exercises Other Exercises Other Exercises: Education provided  re: bringing attention to R side, proper positioning of  RUE for safety when in bed/recliner, self ROM exercises for RUE (composite digit flexion/extension, wrist flexion/extension, elbow flexion/extension, shoulder flexion to 90 deg). Other Exercises: OT completing PROM of entire RUE.    Shoulder Instructions       General Comments      Pertinent Vitals/ Pain       Pain Assessment Pain Assessment: No/denies pain  Home Living          Prior Functioning/Environment              Frequency  Min 3X/week        Progress Toward Goals  OT Goals(current goals can now be found in the care plan section)  Progress towards OT goals: Progressing toward goals  Acute Rehab OT Goals Patient Stated Goal: to walk OT Goal Formulation: With patient Time For Goal Achievement: 08/06/22 Potential to Achieve Goals: Good  Plan Discharge plan remains appropriate;Frequency remains appropriate    Co-evaluation                 AM-PAC OT "6 Clicks" Daily Activity     Outcome Measure   Help from another person eating meals?: A Little Help from another person taking care of personal grooming?: A Little Help from another person toileting, which includes using toliet, bedpan, or urinal?: A Lot Help from another person bathing (including washing, rinsing, drying)?: A Lot Help from another person to put on and taking off regular upper body clothing?: A Lot Help from another person to put on and taking off regular lower body clothing?: A Lot 6 Click Score: 14    End of Session    OT Visit Diagnosis: Other abnormalities of gait and mobility (R26.89);Muscle weakness (generalized) (M62.81)   Activity Tolerance Patient tolerated treatment well   Patient Left in chair;with call bell/phone within reach;with chair alarm set   Nurse Communication Mobility status        Time: 1610-9604 OT Time Calculation (min): 12 min  Charges: OT General Charges $OT Visit: 1 Visit OT Treatments $Neuromuscular Re-education: 8-22 mins  [facility], OTR/L ascom [phone]  08/02/22, 5:45 PM

## 2022-08-02 NOTE — Progress Notes (Signed)
Physical Therapy Treatment Patient Details Name: Destiny Palmer MRN: [id number] DOB: 1965-06-06 Today's Date: 08/02/2022   History of Present Illness Destiny Palmer is a 56yoF who comes to [facility] on 5/2 with AMS, BG in 600s on arrival. Code stoke called later in evening after acute onset motor deficits RUE. PMH: DM, HTN, SI, Lt shoulder dislocation s/p closed reduction 2018. MRI brain revealing "multifocal acute ischemia within the left PCA territory, including the occipital lobe, temporal lobe and thalamus."  At baseline pt denies any impairment of gait or balance, not use of AD, reports to live with mother whom she provides caregiver assistance.    PT Comments    Pt seen for PT tx with pt agreeable. Session focused on bed mobility, balance, transfers, & R NMR. Pt requires mod assist for bed mobility & min<>max assist for transfers. Pt with decreased balance when reaching R of midline & R knee buckling in standing. Pt very impulsive with movement at times, requiring max assist to prevent falls. Continue to recommend ongoing PT services to address deficits noted.    Recommendations for follow up therapy are one component of a multi-disciplinary discharge planning process, led by the attending physician.  Recommendations may be updated based on patient status, additional functional criteria and insurance authorization.  Follow Up Recommendations  Can patient physically be transported by private vehicle: No    Assistance Recommended at Discharge Intermittent Supervision/Assistance  Patient can return home with the following Two people to help with walking and/or transfers;Two people to help with bathing/dressing/bathroom;Help with stairs or ramp for entrance;Assist for transportation   Equipment Recommendations  None recommended by PT (TBD in next venue)    Recommendations for Other Services       Precautions / Restrictions Precautions Precautions: Fall Precaution Comments: R  hemi Required Braces or Orthoses: Sling (for RUE support when OOB) Restrictions Weight Bearing Restrictions: No     Mobility  Bed Mobility Overal bed mobility: Needs Assistance Bed Mobility: Supine to Sit     Supine to sit: Mod assist, HOB elevated (assistance to move RLE to EOB)          Transfers Overall transfer level: Needs assistance Equipment used: 1 person hand held assist Transfers: Sit to/from Stand Sit to Stand: Min assist Stand pivot transfers: Max assist         General transfer comment: STS from EOB with min assist with PT blocking R knee to prevent buckling. Pt transferred bed>recliner via stand pivot with MAX cuing for safety with pt experiencing R knee buckling requiring max assist to prevent fall & assistance to sit in recliner.    Ambulation/Gait                   Stairs             Wheelchair Mobility    Modified Rankin (Stroke Patients Only)       Balance Overall balance assessment: Needs assistance Sitting-balance support: Single extremity supported, Feet supported Sitting balance-Leahy Scale: Fair     Standing balance support: Single extremity supported, During functional activity Standing balance-Leahy Scale: Poor                              Cognition Arousal/Alertness: Awake/alert Behavior During Therapy: Impulsive Overall Cognitive Status: Impaired/Different from baseline Area of Impairment: Attention, Memory, Following commands, Safety/judgement, Awareness, Problem solving  Memory: Decreased short-term memory, Decreased recall of precautions Following Commands: Follows one step commands inconsistently, Follows one step commands with increased time Safety/Judgement: Decreased awareness of safety, Decreased awareness of deficits Awareness: Intellectual Problem Solving: Slow processing, Decreased initiation, Difficulty sequencing, Requires verbal cues, Requires tactile cues           Exercises Other Exercises Other Exercises: Pt engaged in seated dynamic balance activity requiring pt to reach outside of BOS with LUE, mostly reaching to R of midline, to return to midline sitting. Pt initially relies heavily on LUE to return to midline sitting with cuing to use more trunk/core musculature. Pt also requires up to max assist to prevent LOB when reaching to R. Other Exercises: Pt engaged in standing at windowsill with LUE support on ledge & max assist with PT providing blocking at R knee to prevent buckling. Pt engaged in stepping forwards & backwards with LUE for increased weight bearing through RLE for NMR retraining & strengthening. Pt unable to feel when R knee is extended vs flexed & attempts to look at it instead. Pt with intermittent R knee buckling requiring max assist to prevent fall. Other Exercises: Pt noted to be incontinent of BM smear & required total assist for peri hygiene. Other Exercises: PT educated pt on strokes & stroke recovery & importance of PT during this time.    General Comments        Pertinent Vitals/Pain Pain Assessment Pain Assessment: Faces Faces Pain Scale: Hurts a little bit Pain Location: soreness when PT touched RUE Pain Descriptors / Indicators: Grimacing, Sore Pain Intervention(s): Repositioned, Monitored during session    Home Living                          Prior Function            PT Goals (current goals can now be found in the care plan section) Acute Rehab PT Goals Patient Stated Goal: to rest, get better PT Goal Formulation: With patient Progress towards PT goals: Progressing toward goals    Frequency    Min 4X/week      PT Plan Current plan remains appropriate    Co-evaluation              AM-PAC PT "6 Clicks" Mobility   Outcome Measure  Help needed turning from your back to your side while in a flat bed without using bedrails?: A Little Help needed moving from lying on your back  to sitting on the side of a flat bed without using bedrails?: A Lot Help needed moving to and from a bed to a chair (including a wheelchair)?: A Lot Help needed standing up from a chair using your arms (e.g., wheelchair or bedside chair)?: A Lot Help needed to walk in hospital room?: Total Help needed climbing 3-5 steps with a railing? : Total 6 Click Score: 11    End of Session Equipment Utilized During Treatment: Gait belt Activity Tolerance: Patient tolerated treatment well Patient left: in chair;with chair alarm set;with call bell/phone within reach Nurse Communication: Mobility status PT Visit Diagnosis: Other abnormalities of gait and mobility (R26.89);Unsteadiness on feet (R26.81);Difficulty in walking, not elsewhere classified (R26.2);Muscle weakness (generalized) (M62.81);Other symptoms and signs involving the nervous system (R29.898);Hemiplegia and hemiparesis Hemiplegia - Right/Left: Right Hemiplegia - dominant/non-dominant: Dominant Hemiplegia - caused by: Cerebral infarction     Time: 1457-1520 PT Time Calculation (min) (ACUTE ONLY): 23 min  Charges:  $Therapeutic Activity: 8-22 mins $Neuromuscular  Re-education: 8-22 mins                     Aleda Grana, PT, DPT 08/02/22, 4:06 PM  Sandi Mariscal 08/02/2022, 4:05 PM

## 2022-08-02 NOTE — TOC Progression Note (Signed)
Transition of Care [facility]) - Progression Note    Patient Details  Name: Destiny Palmer MRN: [id number] Date of Birth: Aug 05, 1965  Transition of Care [facility]) CM/SW Contact  Marlowe Sax, RN Phone Number: 08/02/2022, 11:58 AM  Clinical Narrative:     The only facility available INN in this area [facility], [facility] is not able to make a bed offer  Expected Discharge Plan: Skilled Nursing Facility Barriers to Discharge: SNF Pending bed offer, Insurance Authorization  Expected Discharge Plan and Services       Living arrangements for the past 2 months: Single Family Home                                       Social Determinants of Health (SDOH) Interventions    Readmission Risk Interventions     No data to display

## 2022-08-02 NOTE — TOC Progression Note (Addendum)
Transition of Care [facility]) - Progression Note    Patient Details  Name: Destiny Palmer MRN: [id number] Date of Birth: 1965-12-09  Transition of Care [facility]) CM/SW Contact  Marlowe Sax, RN Phone Number: 08/02/2022, 2:36 PM  Clinical Narrative:   Spoke with the patients sister Beverely Low and explained that [place] [facility] is the closest facility that accepts the [place]  She requested that I look at [facility] that [facility] is too far away and she asked me to check with the facility in [place], I sent the referral to  [email or web address] Faxed to [phone], Called [facility] in [place] and left a VM for admission asking them to review the referral   Expected Discharge Plan: Skilled Nursing Facility Barriers to Discharge: SNF Pending bed offer, Insurance Authorization  Expected Discharge Plan and Services       Living arrangements for the past 2 months: Single Family Home                                       Social Determinants of Health (SDOH) Interventions    Readmission Risk Interventions     No data to display

## 2022-08-02 NOTE — Consult Note (Addendum)
[facility] TeleNeurology Consult Services   Patient Name:   Destiny Palmer, Destiny Palmer Date of Birth:   01/30/1966 Identification Number:   MRN - [id number] Date of Service:   07/22/2022 21:24:37  Diagnosis:       I63.89 - Cerebrovascular accident (CVA) due to other mechanism [facility])  Impression:      This is a 57 yo F w a PMHX of stroke, who presents to the ED with the acute onset of AMS. Course complicated by R sided weakness. She was last known to be at baseline at an unclear time. On arrival to the bedside her symptoms remain persistent. BP was found to be 135 systolic.        Physical exam with R sided weakness in the arm and leg  Labs pending  Imaging as above      Etiology could be from a CE vs. vessel to vessel thromboembolism vs. less likely small vessel disease        -Allow permissive HTN to 220/110  -MRI brain w/o  -ASA 81  -2D echo  -PT/OT/speech therapy  Our recommendations are outlined below.  Recommendations:        Stroke/Telemetry Floor       Neuro Checks       Bedside Swallow Eval       DVT Prophylaxis       IV Fluids, Normal Saline       Head of Bed 30 Degrees       Euglycemia and Avoid Hyperthermia (PRN Acetaminophen)    ------------------------------------------------------------------------------  Advanced Imaging: Advanced Imaging Deferred because:  intervenable LVO not suspected at current with her mild symptoms, and the last known well is at least a few days based on our information, placing her outside the 24 hour window for mechanical thrombectomy consideration.    Metrics: Last Known Well: Unknown [facility] Notification Time: 07/22/2022 21:24:37 Stamp Time: 07/22/2022 21:24:37 Initial Response Time: 07/22/2022 21:26:53 Symptoms: R sided weakness, AMS. Initial patient interaction: 07/22/2022 21:31:24 NIHSS Assessment Completed: 07/22/2022 21:33:56 Patient is not a candidate for Thrombolytic. Thrombolytic Medical  Decision: 07/22/2022 21:45:23 Patient was not deemed candidate for Thrombolytic because of following reasons: Last Well Known Above 4.5 Hours.  I personally Reviewed the CT Head and it Showed  Primary Provider Notified of Diagnostic Impression and Management Plan on: 07/22/2022 21:56:18 Spoke With: paged Steward Drone Able to Reach 07/22/2022 21:56:18    ------------------------------------------------------------------------------  History of Present Illness: Patient is a 57 year old Female.  Inpatient stroke alert was called for symptoms of R sided weakness, AMS. This is a 57 yo F w a PMHX of stroke, who presents to the ED with the acute onset of AMS. Course complicated by R sided weakness. She was last known to be at baseline at an unclear time. On arrival to the bedside her symptoms remain persistent. BP was found to be 135 systolic. She was taken immediately for [facility] and further evaluation. Decision on whether or not to give pharmacological thrombolysis was made based on indications, contraindications, and patient's disability status and preference.    Medications:  Anticoagulant use:  Unknown Antiplatelet use: Unknown Reviewed EMR for current medications  Allergies:  NKDA  Social History: Smoking: No Alcohol Use: No Drug Use: No  Family History:  There is no family history of premature cerebrovascular disease pertinent to this consultation  ROS : 14 Points Review of Systems was performed and was negative except mentioned in HPI.  Past Surgical History: There Is No Surgical History Contributory To  Today's Visit    Examination: BP(135/85), Pulse(71), Blood Glucose(264) 1A: Level of Consciousness - Alert; keenly responsive + 0 1B: Ask Month and Age - Both Questions Right + 0 1C: Blink Eyes & Squeeze Hands - Performs Both Tasks + 0 2: Test Horizontal Extraocular Movements - Normal + 0 3: Test Visual Fields - No Visual Loss + 0 4: Test Facial Palsy (Use Grimace if  Obtunded) - Normal symmetry + 0 5A: Test Left Arm Motor Drift - No Drift for 10 Seconds + 0 5B: Test Right Arm Motor Drift - Drift, but doesn't hit bed + 1 6A: Test Left Leg Motor Drift - No Drift for 5 Seconds + 0 6B: Test Right Leg Motor Drift - Drift, but doesn't hit bed + 1 7: Test Limb Ataxia (FNF/Heel-Shin) - No Ataxia + 0 8: Test Sensation - Normal; No sensory loss + 0 9: Test Language/Aphasia - Normal; No aphasia + 0 10: Test Dysarthria - Normal + 0 11: Test Extinction/Inattention - No abnormality + 0  NIHSS Score: 2   Pre-Morbid Modified Rankin Scale: 0 Points = No symptoms at all  Spoke with : paged Steward Drone  Patient/Family was informed the Neurology Consult would occur via TeleHealth consult by way of interactive audio and video telecommunications and consented to receiving care in this manner.   Patient is being evaluated for possible acute neurologic impairment and high probability of imminent or life-threatening deterioration. I spent total of 35 minutes providing care to this patient, including time for face to face visit via telemedicine, review of medical records, imaging studies and discussion of findings with providers, the patient and/or family.   Dr Richardo Priest   [facility] For Inpatient follow-up with [facility] physician please call [facility] [phone]. This is not an outpatient service. Post hospital discharge, please contact hospital directly.  Please do not communicate with [facility] physicians via secure chat. If you have any questions, Please contact [facility]. Please call or reconsult our service if there are any clinical or diagnostic changes.

## 2022-08-02 NOTE — Progress Notes (Signed)
PROGRESS NOTE  Destiny Palmer  ZOX:[id number] DOB: 08-Oct-1965 DOA: 07/22/2022 PCP: [facility]   Brief Narrative: Patient is a 57 year old female with history of diabetes type 2, adjustment disorder, PTSD presented with confusion.  On presentation she was found to be lethargic, nauseated and vomiting.  UDS was negative.  Patient noted to have slurred speech, difficulty walking and progressively became worse and after admission.  Stroke alert was called.  MRI showed large multifocal acute infarct in the PCA territory.  Neurology consulted.  Started on dual antiplatelet therapy.  PT/OT recommending acute inpatient rehab.  Waiting for placement.  5/12: Patient remained stable, still awaiting insurance authorization.  5/13: Got insurance authorization today to go to facility tomorrow.  Assessment & Plan:  Principal Problem:   Acute metabolic encephalopathy Active Problems:   Cerebrovascular accident (CVA) (HCC)   Acute metabolic encephalopathy secondary to CVA: Remains confused but  alert and awake.  Follows commands.  Acute CVA:  MRI showed large multifocal acute infarct in the PCA territory.  Neurology consulted.  Started on dual antiplatelet therapy.  Neurology recommended aspirin and Plavix for 21 days followed by aspirin monotherapy.  Continue statin. PT/OT recommending acute inpatient rehab.  Needs to follow-up with neurology as an outpatient  Insulin-dependent diabetes type 2 with hyperglycemia: Uncontrolled.  A1c of 14.5.  Continue current insulin regimen.  Monitor blood sugars.  Diabetic coordinator  following  Hypertension: Currently blood pressure stable.  Monitor  Hyperlipidemia: On Lipitor.LDL of 158  Depression/adjustment disorder: On Prozac, as needed Atarax   DVT prophylaxis:enoxaparin (LOVENOX) injection 40 mg Start: 07/22/22 2200     Code Status: Full Code  Family Communication: No family at bedside  Patient status:Inpatient  Patient is from  :Home  Anticipated discharge to: SNF  Estimated DC date:not sure   Consultants: Neurology  Procedures:None  Antimicrobials:  Anti-infectives (From admission, onward)    Start     Dose/Rate Route Frequency Ordered Stop   07/22/22 1715  cefTRIAXone (ROCEPHIN) 1 g in sodium chloride 0.9 % 100 mL IVPB        1 g 200 mL/hr over 30 Minutes Intravenous  Once 07/22/22 1713 07/22/22 1905       Subjective: Patient with no new concern.  Objective: Vitals:   08/01/22 1327 08/01/22 1558 08/02/22 0012 08/02/22 0759  BP: 100/69 97/65 102/82 99/65  Pulse: 66 69 70 66  Resp: 18 16 16 15   Temp: 98.1 F (36.7 C) 97.9 F (36.6 C) 97.8 F (36.6 C) 97.6 F (36.4 C)  TempSrc: Oral Oral    SpO2: 98% 96% 97% 100%  Weight:      Height:        Intake/Output Summary (Last 24 hours) at 08/02/2022 1430 Last data filed at 08/01/2022 1901 Gross per 24 hour  Intake 240 ml  Output 750 ml  Net -510 ml    Filed Weights   07/22/22 1700 07/27/22 0439 07/28/22 0451  Weight: 68.9 kg 70.1 kg 67.8 kg    Examination:  General.  Well-developed lady, in no acute distress. Pulmonary.  Lungs clear bilaterally, normal respiratory effort. CV.  Regular rate and rhythm, no JVD, rub or murmur. Abdomen.  Soft, nontender, nondistended, BS positive. CNS.  Alert and oriented .  Right hemiparesis with dysarthria Extremities.  No edema, no cyanosis, pulses intact and symmetrical. Psychiatry.  Judgment and insight appears normal.   Data Reviewed: I have personally reviewed following labs and imaging studies  CBC: Recent Labs  Lab 07/29/22 0412 07/31/22  0445  WBC 6.7 7.0  HGB 14.6 14.7  HCT 43.1 43.8  MCV 90.4 90.3  PLT 251 241    Basic Metabolic Panel: Recent Labs  Lab 07/29/22 0412 07/31/22 0445  NA  --  137  K  --  3.9  CL  --  103  CO2  --  27  GLUCOSE  --  197*  BUN  --  14  CREATININE 0.59 0.54  CALCIUM  --  8.8*      No results found for this or any previous visit (from the  past 240 hour(s)).    Radiology Studies: No results found.  Scheduled Meds:   stroke: early stages of recovery book   Does not apply Once   aspirin  81 mg Oral Daily   atorvastatin  40 mg Oral Daily   clopidogrel  75 mg Oral Daily   enoxaparin (LOVENOX) injection  40 mg Subcutaneous Q24H   ezetimibe  10 mg Oral Daily   FLUoxetine  40 mg Oral Daily   insulin aspart  0-15 Units Subcutaneous TID WC   insulin aspart  7 Units Subcutaneous TID WC   insulin glargine-yfgn  27 Units Subcutaneous QHS   tamsulosin  0.4 mg Oral Daily   Continuous Infusions:   LOS: 10 days   Arnetha Courser, MD [facility] P5/13/2024, 2:30 PM

## 2022-08-03 LAB — GLUCOSE, CAPILLARY: Glucose-Capillary: 209 mg/dL — ABNORMAL HIGH (ref 70–99)

## 2022-08-03 MED ORDER — ATORVASTATIN CALCIUM 40 MG PO TABS: 40.0000 mg | ORAL_TABLET | Freq: Every day | ORAL | 0 refills | Status: AC

## 2022-08-03 MED ORDER — INSULIN GLARGINE-YFGN 100 UNIT/ML ~~LOC~~ SOLN: 27.0000 [IU] | Freq: Every day | SUBCUTANEOUS | 11 refills | Status: AC

## 2022-08-03 MED ORDER — TAMSULOSIN HCL 0.4 MG PO CAPS: 0.4000 mg | ORAL_CAPSULE | Freq: Every day | ORAL | 0 refills | Status: AC

## 2022-08-03 MED ORDER — INSULIN ASPART 100 UNIT/ML IJ SOLN: 7.0000 [IU] | Freq: Three times a day (TID) | INTRAMUSCULAR | 11 refills | Status: AC

## 2022-08-03 MED ORDER — EZETIMIBE 10 MG PO TABS: 10.0000 mg | ORAL_TABLET | Freq: Every day | ORAL | 0 refills | Status: AC

## 2022-08-03 MED ORDER — ASPIRIN 81 MG PO CHEW: 81.0000 mg | CHEWABLE_TABLET | Freq: Every day | ORAL | 0 refills | Status: AC

## 2022-08-03 MED ORDER — CLOPIDOGREL BISULFATE 75 MG PO TABS: 75.0000 mg | ORAL_TABLET | Freq: Every day | ORAL | 0 refills | Status: AC

## 2022-08-03 MED ORDER — CLOPIDOGREL BISULFATE 75 MG PO TABS: 75.0000 mg | ORAL_TABLET | Freq: Every day | ORAL | 0 refills | Status: DC

## 2022-08-03 NOTE — TOC Progression Note (Signed)
Transition of Care [facility]) - Progression Note    Patient Details  Name: Destiny Palmer MRN: [id number] Date of Birth: 12-Sep-1965  Transition of Care [facility]) CM/SW Contact  Marlowe Sax, RN Phone Number: 08/03/2022, 10:55 AM  Clinical Narrative:    Patient going to [facility] room 509, EMS to transport, EMS called  there are a couple ones ahead   Expected Discharge Plan: Skilled Nursing Facility Barriers to Discharge: SNF Pending bed offer, Insurance Authorization  Expected Discharge Plan and Services       Living arrangements for the past 2 months: Single Family Home Expected Discharge Date: 08/03/22                                     Social Determinants of Health (SDOH) Interventions    Readmission Risk Interventions     No data to display

## 2022-08-03 NOTE — TOC Progression Note (Signed)
Transition of Care [facility]) - Progression Note    Patient Details  Name: Destiny Palmer MRN: [id number] Date of Birth: 16-Dec-1965  Transition of Care [facility]) CM/SW Contact  Marlowe Sax, RN Phone Number: 08/03/2022, 10:12 AM  Clinical Narrative:   Spoke with the patient's sister Destiny Palmer and explained [facility] to closest rehab facility covered by the [place] and that we do have Ins coverage  I explained that if they choose to go to a different facility that the SW at the rehab facility would have to facilitate that She stated understanding     Expected Discharge Plan: Skilled Nursing Facility Barriers to Discharge: SNF Pending bed offer, Insurance Authorization  Expected Discharge Plan and Services       Living arrangements for the past 2 months: Single Family Home Expected Discharge Date: 08/03/22                                     Social Determinants of Health (SDOH) Interventions    Readmission Risk Interventions     No data to display

## 2022-08-03 NOTE — Progress Notes (Signed)
Called and spoke with patient's sister Beverely Low and let her know that EMS is here to transport patient.

## 2022-08-03 NOTE — TOC Progression Note (Addendum)
Transition of Care [facility]) - Progression Note    Patient Details  Name: Destiny Palmer MRN: [id number] Date of Birth: [birth year]  Transition of Care [facility]) CM/SW Contact  Marlowe Sax, RN Phone Number: 08/03/2022, 9:26 AM  Clinical Narrative:   Called [facility] in [place] no beds available the patient will discharge to [facility], [employer] insurance has been approved, I called the patient's sister Beverely Low and left a VM asking for a call back The patient is aware that [facility] the closest option and stated agreement     Expected Discharge Plan: Skilled Nursing Facility Barriers to Discharge: SNF Pending bed offer, Insurance Authorization  Expected Discharge Plan and Services       Living arrangements for the past 2 months: Single Family Home Expected Discharge Date: 08/03/22                                     Social Determinants of Health (SDOH) Interventions    Readmission Risk Interventions     No data to display

## 2022-08-03 NOTE — Plan of Care (Signed)
  Problem: Health Behavior/Discharge Planning: Goal: Ability to manage health-related needs will improve Outcome: Progressing   Problem: Clinical Measurements: Goal: Ability to maintain clinical measurements within normal limits will improve Outcome: Progressing Goal: Diagnostic test results will improve Outcome: Progressing   Problem: Nutrition: Goal: Adequate nutrition will be maintained Outcome: Progressing   Problem: Elimination: Goal: Will not experience complications related to bowel motility Outcome: Progressing Goal: Will not experience complications related to urinary retention Outcome: Progressing

## 2022-08-03 NOTE — Progress Notes (Addendum)
Report called to nurse Sunny Schlein at [facility]. Patient has not voided yet this morning therefore bladder scanned and 262 cc volume detected. Patient denies needing to void. [place] [facility] transporting patient via ambulance. Discharge packet being sent with EMS. Patient's sister Beverely Low coming to get patient's belongings including glasses this afternoon around 1630. Pillow and quilt sent with EMS.

## 2022-08-03 NOTE — Discharge Summary (Signed)
Physician Discharge Summary  Destiny Palmer RUE:[id number] DOB: 31-Jan-1966 DOA: 07/22/2022  PCP: [facility]  Admit date: 07/22/2022 Discharge date: 08/03/2022  Admitted From: Home Disposition:  SNF  Recommendations for Outpatient Follow-up:  Follow up with PCP in 1-2 weeks Follow up with neurology as scheduled  Discharge Condition:Stable  CODE STATUS:Full  Diet recommendation: As tolerated low salt low fat diet    Brief/Interim Summary: Patient is a 57 year old female with history of diabetes type 2, adjustment disorder, PTSD presented with confusion. On presentation she was found to be lethargic, nauseated and vomiting. UDS was negative. Patient noted to have slurred speech, difficulty walking and progressively became worse and after admission. Stroke alert was called. MRI showed large multifocal acute infarct in the PCA territory. Neurology consulted. Started on dual antiplatelet therapy. PT/OT recommending acute inpatient rehab which was declined - currently discharging to SNF for ongoing rehab.   Discharge Diagnoses:  Principal Problem:   Acute metabolic encephalopathy Active Problems:   Cerebrovascular accident (CVA) (HCC)   Acute metabolic encephalopathy secondary to CVA: Remains confused but  alert and awake.  Follows commands.   Acute CVA:  MRI showed large multifocal acute infarct in the PCA territory.  Neurology consulted.  Started on dual antiplatelet therapy.  Neurology recommended aspirin and Plavix for 21 days followed by aspirin monotherapy.  Continue statin. PT/OT recommending acute inpatient rehab but patient does not meet criteria -> transfer to SNF.   Insulin-dependent diabetes type 2 with hyperglycemia: Uncontrolled.  A1c of 14.5.  Continue new insulin regimen.  Monitor blood sugars.  Diabetic coordinator  following   Hypertension: Currently blood pressure stable.  Monitor   Hyperlipidemia: On Lipitor.LDL of 158   Depression/adjustment disorder: On  Prozac, as needed Atarax  Discharge Instructions  Discharge Instructions     Discharge patient   Complete by: As directed    Discharge disposition: 03-Skilled Nursing Facility   Discharge patient date: 08/03/2022      Allergies as of 08/03/2022       Reactions   Nortriptyline Other (See Comments)   Other Reaction(s): Too Sedating        Medication List     STOP taking these medications    gabapentin 600 MG tablet Commonly known as: NEURONTIN   HYDROcodone-acetaminophen 5-325 MG tablet Commonly known as: Norco   hydrOXYzine 25 MG tablet Commonly known as: ATARAX   insulin aspart 100 UNIT/ML injection Commonly known as: novoLOG Replaced by: insulin aspart 100 UNIT/ML injection   insulin glargine 100 UNIT/ML injection Commonly known as: LANTUS   lidocaine 5 % Commonly known as: LIDODERM   risperiDONE 2 MG tablet Commonly known as: RISPERDAL   traZODone 50 MG tablet Commonly known as: DESYREL       TAKE these medications    aspirin 81 MG chewable tablet Chew 1 tablet (81 mg total) by mouth daily.   atorvastatin 40 MG tablet Commonly known as: LIPITOR Take 1 tablet (40 mg total) by mouth daily.   clopidogrel 75 MG tablet Commonly known as: PLAVIX Take 1 tablet (75 mg total) by mouth daily.   ezetimibe 10 MG tablet Commonly known as: ZETIA Take 1 tablet (10 mg total) by mouth daily.   FLUoxetine 40 MG capsule Commonly known as: PROZAC Take 1 capsule (40 mg total) by mouth daily.   ibuprofen 600 MG tablet Commonly known as: ADVIL Take 600 mg by mouth every 6 (six) hours as needed for mild pain.   insulin aspart 100 UNIT/ML injection Commonly  known as: novoLOG Inject 7 Units into the skin 3 (three) times daily with meals. Replaces: insulin aspart 100 UNIT/ML injection   insulin glargine-yfgn 100 UNIT/ML injection Commonly known as: SEMGLEE Inject 0.27 mLs (27 Units total) into the skin at bedtime.   naloxone 4 MG/0.1ML Liqd nasal spray  kit Commonly known as: NARCAN Place 1 spray into the nose once.   tamsulosin 0.4 MG Caps capsule Commonly known as: FLOMAX Take 1 capsule (0.4 mg total) by mouth daily.        Contact information for after-discharge care     Destination     [facility] .   Service: Skilled Nursing Contact information: [address] [place] [address] [phone]                    Allergies  Allergen Reactions   Nortriptyline Other (See Comments)    Other Reaction(s): Too Sedating    Consultations: Neuro  Procedures/Studies: DG Swallowing Func-Speech Pathology  Result Date: 07/26/2022 Table formatting from the original result was not included. Modified Barium Swallow Study Patient Details Name: Destiny Palmer MRN: [id number] Date of Birth: 08/24/1965 Today's Date: 07/26/2022 HPI/PMH: HPI: Pt is an 57 y.o. female with a PMHx of DM and HTN who presented to the ED yesterday with lethargy, nausea and vomiting. Her CBG was over 600 with EMS. Family noted her to have some slurred speech and difficulty walking. She had recently stopped taking her metformin. She did not fit criteria for DKA but HHS was on the DDx. She was treated with IVF and glucose trended downward. UDS was negative. U/A showed significant glucosuria.      CT head revealed multifocal subacute infarcts within the left occipital lobe, thalamus, genu of the internal capsule, and cerebral peduncle.      MRI was then obtained, confirming multiple subacute ischemic infarctions in the left PCA territory.      About 1 hour after completion of MRI, she exhibited neurological worsening and a Code Stroke was called. Teleneurology evaluated the patient with exam revealing an NIHSS of 10, which was worse than her exams documented earlier. As there was no clear LKN, she was not a candidate for IV thrombolysis or mechanical thrombectomy. DAPT was initiated along with IVF. Clinical Impression:  Clinical Impression: Pt presents with a mild oropharyngeal dysphagia significant reduced posterior lingual control resulting in pre-swallow loss of boluses to the level of the pyriform sinus as well as pre-swallow transient laryngeal penetration of thin liquids (via teaspoon, cup, straw) and solids and pre-swallow transient aspiration of thin liquids during  sequential straw sips. Cued cough was ineffective in clearing material from upper airway as it was inconsistently weak and difficult to perform secondary to apraxia. Pt benefited from cues to take single sips of thin liquids via straw. Pt educated re: results, recommendations, and SLP POC. Pt shown videofluoroscopic images for education. Pt verbalized understanding; however, reinforcement of content may be needed. RN also made aware of results, recommendations, and SLP POC. Recommend continuation of current diet with safe swallowing strategies/aspiration precautions as outlined below. SLP to f/u x1 for diet tolerance. SLP to continue established POC for speech/language tx. Factors that may increase risk of adverse event in presence of aspiration Rubye Oaks & Clearance Coots 2021): No data recorded Recommendations/Plan: Swallowing Evaluation Recommendations Swallowing Evaluation Recommendations Recommendations: PO diet PO Diet Recommendation: Regular; Thin liquids (Level 0) Medication Administration: Whole meds with puree (vs crushed) Supervision: Patient able to self-feed; Intermittent supervision/cueing for  swallowing strategies (set up and initial cueing for safe swallowing strategies) Swallowing strategies  : Minimize environmental distractions; Slow rate; Small bites/sips Postural changes: Position pt fully upright for meals; Stay upright 30-60 min after meals Oral care recommendations: Oral care QID (4x/day) Caregiver Recommendations: Avoid jello, ice cream, thin soups, popsicles; Remove water pitcher; Have oral suction available Treatment Plan Treatment Plan  Treatment recommendations: Therapy as outlined in treatment plan below Follow-up recommendations: Acute inpatient rehab (3 hours/day) Functional status assessment: Patient has had a recent decline in their functional status and demonstrates the ability to make significant improvements in function in a reasonable and predictable amount of time. Treatment frequency: Min 3x/week (x1 visit for dysphagia; continue POC for speech/language) Treatment duration: 2 weeks Interventions: Aspiration precaution training; Compensatory techniques; Patient/family education; Diet toleration management by SLP Recommendations Recommendations for follow up therapy are one component of a multi-disciplinary discharge planning process, led by the attending physician.  Recommendations may be updated based on patient status, additional functional criteria and insurance authorization. Assessment: Orofacial Exam: Orofacial Exam Oral Cavity: Oral Hygiene: WFL Oral Cavity - Dentition: Adequate natural dentition Anatomy: Anatomy: WFL Boluses Administered: Boluses Administered Boluses Administered: Thin liquids (Level 0); Puree; Solid  Oral Impairment Domain: Oral Impairment Domain Lip Closure: No labial escape Tongue control during bolus hold: Posterior escape of greater than half of bolus Bolus preparation/mastication: Timely and efficient chewing and mashing Bolus transport/lingual motion: Brisk tongue motion Oral residue: Trace residue lining oral structures Location of oral residue : Floor of mouth; Tongue; Palate Initiation of pharyngeal swallow : Pyriform sinuses  Pharyngeal Impairment Domain: Pharyngeal Impairment Domain Soft palate elevation: No bolus between soft palate (SP)/pharyngeal wall (PW) Laryngeal elevation: Complete superior movement of thyroid cartilage with complete approximation of arytenoids to epiglottic petiole Anterior hyoid excursion: Complete anterior movement Epiglottic movement: Complete inversion Laryngeal vestibule  closure: Incomplete, narrow column air/contrast in laryngeal vestibule Pharyngeal stripping wave : Present - complete Pharyngeal contraction (A/P view only): N/A Pharyngoesophageal segment opening: Complete distension and complete duration, no obstruction of flow Tongue base retraction: Trace column of contrast or air between tongue base and PPW Pharyngeal residue: Collection of residue within or on pharyngeal structures Location of pharyngeal residue: Valleculae  Esophageal Impairment Domain: Esophageal Impairment Domain Esophageal clearance upright position: Complete clearance, esophageal coating Pill: Esophageal Impairment Domain Esophageal clearance upright position: Complete clearance, esophageal coating Penetration/Aspiration Scale Score: Penetration/Aspiration Scale Score 1.  Material does not enter airway: Thin liquids (Level 0); Puree; Solid 2.  Material enters airway, remains ABOVE vocal cords then ejected out: Thin liquids (Level 0); Solid 6.  Material enters airway, passes BELOW cords then ejected out: Thin liquids (Level 0) Compensatory Strategies: Compensatory Strategies Compensatory strategies: Yes Other(comment): Ineffective Ineffective Other(comment): Thin liquid (Level 0) (cued cough)   General Information: Caregiver present: No  Diet Prior to this Study: Regular; Thin liquids (Level 0)   Temperature : Normal   Respiratory Status: WFL   Supplemental O2: None (Room air)   History of Recent Intubation: No  Behavior/Cognition: Alert; Cooperative; Pleasant mood; Confused; Distractible; Requires cueing Self-Feeding Abilities: Needs set-up for self-feeding Baseline vocal quality/speech: Normal Volitional Cough: Able to elicit Volitional Swallow: Able to elicit Exam Limitations: No limitations Goal Planning: Prognosis for improved oropharyngeal function: Fair Barriers to Reach Goals: Language deficits Barriers/Prognosis Comment: new CVA, extension Patient/Family Stated Goal: continue eating and drinking a  regular diet Consulted and agree with results and recommendations: Patient; Nurse Pain: Pain Assessment Pain Assessment: No/denies pain Breathing: 0 Negative Vocalization:  0 Facial Expression: 0 Body Language: 0 Consolability: 0 PAINAD Score: 0 End of Session: Start Time:SLP Start Time (ACUTE ONLY): 1110 Stop Time: SLP Stop Time (ACUTE ONLY): 1140 Time Calculation:SLP Time Calculation (min) (ACUTE ONLY): 30 min Charges: SLP Evaluations $ SLP Speech Visit: 1 Visit SLP Evaluations $MBS Swallow: 1 Procedure SLP visit diagnosis: SLP Visit Diagnosis: Dysphagia, oropharyngeal phase (R13.12) Past Medical History: Past Medical History: Diagnosis Date  Diabetes mellitus without complication (HCC)   Hypertension  Past Surgical History: Past Surgical History: Procedure Laterality Date  NO PAST SURGERIES    SHOULDER CLOSED REDUCTION Left 03/30/2016  Procedure: CLOSED REDUCTION SHOULDER;  Surgeon: Donato Heinz, MD;  Location: [facility] ORS;  Service: Orthopedics;  Laterality: Left; [name], M.S., CCC-SLP Speech-Language Pathologist [facility] [phone] Arnette Felts) Woodroe Chen 07/26/2022, 12:05 PM  CT HEAD WO CONTRAST ( )  Result Date: 07/23/2022 CLINICAL DATA:  Stroke follow-up. EXAM: CT HEAD WITHOUT CONTRAST TECHNIQUE: Contiguous axial images were obtained from the base of the skull through the vertex without intravenous contrast. RADIATION DOSE REDUCTION: This exam was performed according to the departmental dose-optimization program which includes automated exposure control, adjustment of the mA and/or kV according to patient size and/or use of iterative reconstruction technique. COMPARISON:  07/23/2022 and 07/22/2022. FINDINGS: Brain: No new infarcts. Hypoattenuation reflecting the recent infarcts noted in the left occipital lobe left middle cerebral peduncle and adjacent genu of the internal capsule with a small area in the subcortical left frontal lobe. Small focus noted  along the anterolateral thalamus. These are all stable from the most recent prior CT, 07/23/2022. No intracranial hemorrhage. Ventricles normal in size and configuration. No extra-axial masses or abnormal fluid collections. Vascular: No hyperdense vessel or unexpected calcification. Skull: Normal. Negative for fracture or focal lesion. Sinuses/Orbits: Globes and orbits are unremarkable. Mild ethmoid sinus mucosal thickening. Other: None. IMPRESSION: 1. No significant change when compared to the most recent prior head CT. 2. Multiple infarcts as detailed. No new infarcts. No intracranial hemorrhage. Electronically Signed   By: Amie Portland M.D.   On: 07/23/2022 17:32   ECHOCARDIOGRAM COMPLETE  Result Date: 07/23/2022    ECHOCARDIOGRAM REPORT   Patient Name:   LEMA STIDHAM Date of Exam: 07/23/2022 Medical Rec #:  [id number]        Height:       66.0 in Accession #:    [id number]       Weight:       152.0 lb Date of Birth:  04-25-1965        BSA:          1.780 m Patient Age:    56 years         BP:           124/70 mmHg Patient Gender: F                HR:           61 bpm. Exam Location:  [facility] Procedure: 2D Echo, Cardiac Doppler and Color Doppler Indications:     Stroke I63.9  History:         Patient has no prior history of Echocardiogram examinations.                  Risk Factors:Diabetes and Hypertension.  Sonographer:     Cristela Blue Referring Phys:  [id number] BRENDA MORRISON Diagnosing Phys: Julien Nordmann MD  Sonographer Comments: No subcostal window. IMPRESSIONS  1. Left  ventricular ejection fraction, by estimation, is 60 to 65%. The left ventricle has normal function. The left ventricle has no regional wall motion abnormalities. Left ventricular diastolic parameters are consistent with Grade I diastolic dysfunction (impaired relaxation).  2. Right ventricular systolic function is normal. The right ventricular size is normal. There is normal pulmonary artery systolic pressure. The estimated right  ventricular systolic pressure is 24.5 mmHg.  3. The mitral valve is normal in structure. Mild mitral valve regurgitation. No evidence of mitral stenosis.  4. The aortic valve is normal in structure. Aortic valve regurgitation is not visualized. No aortic stenosis is present.  5. The inferior vena cava is normal in size with greater than 50% respiratory variability, suggesting right atrial pressure of 3 mmHg. FINDINGS  Left Ventricle: Left ventricular ejection fraction, by estimation, is 60 to 65%. The left ventricle has normal function. The left ventricle has no regional wall motion abnormalities. The left ventricular internal cavity size was normal in size. There is  no left ventricular hypertrophy. Left ventricular diastolic parameters are consistent with Grade I diastolic dysfunction (impaired relaxation). Right Ventricle: The right ventricular size is normal. No increase in right ventricular wall thickness. Right ventricular systolic function is normal. There is normal pulmonary artery systolic pressure. The tricuspid regurgitant velocity is 2.21 m/s, and  with an assumed right atrial pressure of 5 mmHg, the estimated right ventricular systolic pressure is 24.5 mmHg. Left Atrium: Left atrial size was normal in size. Right Atrium: Right atrial size was normal in size. Pericardium: There is no evidence of pericardial effusion. Mitral Valve: The mitral valve is normal in structure. Mild mitral valve regurgitation. No evidence of mitral valve stenosis. MV peak gradient, 6.0 mmHg. The mean mitral valve gradient is 2.0 mmHg. Tricuspid Valve: The tricuspid valve is normal in structure. Tricuspid valve regurgitation is mild . No evidence of tricuspid stenosis. Aortic Valve: The aortic valve is normal in structure. Aortic valve regurgitation is not visualized. No aortic stenosis is present. Aortic valve mean gradient measures 4.0 mmHg. Aortic valve peak gradient measures 6.9 mmHg. Aortic valve area, by VTI measures 2.07  cm. Pulmonic Valve: The pulmonic valve was normal in structure. Pulmonic valve regurgitation is not visualized. No evidence of pulmonic stenosis. Aorta: The aortic root is normal in size and structure. Venous: The inferior vena cava is normal in size with greater than 50% respiratory variability, suggesting right atrial pressure of 3 mmHg. IAS/Shunts: No atrial level shunt detected by color flow Doppler.  LEFT VENTRICLE PLAX 2D LVIDd:         3.30 cm   Diastology LVIDs:         2.10 cm   LV e' medial:    5.77 cm/s LV PW:         1.10 cm   LV E/e' medial:  17.3 LV IVS:        1.30 cm   LV e' lateral:   11.30 cm/s LVOT diam:     1.90 cm   LV E/e' lateral: 8.8 LV SV:         56 LV SV Index:   31 LVOT Area:     2.84 cm  RIGHT VENTRICLE RV Basal diam:  3.00 cm RV Mid diam:    3.00 cm RV S prime:     11.00 cm/s TAPSE (M-mode): 1.2 cm LEFT ATRIUM           Index        RIGHT ATRIUM  Index LA diam:      2.70 cm 1.52 cm/m   RA Area:     8.62 cm LA Vol (A2C): 22.4 ml 12.59 ml/m  RA Volume:   15.00 ml 8.43 ml/m LA Vol (A4C): 25.7 ml 14.44 ml/m  AORTIC VALVE AV Area (Vmax):    1.74 cm AV Area (Vmean):   1.87 cm AV Area (VTI):     2.07 cm AV Vmax:           131.00 cm/s AV Vmean:          89.533 cm/s AV VTI:            0.268 m AV Peak Grad:      6.9 mmHg AV Mean Grad:      4.0 mmHg LVOT Vmax:         80.60 cm/s LVOT Vmean:        58.900 cm/s LVOT VTI:          0.196 m LVOT/AV VTI ratio: 0.73  AORTA Ao Root diam: 3.10 cm MITRAL VALVE                TRICUSPID VALVE MV Area (PHT): 2.87 cm     TR Peak grad:   19.5 mmHg MV Area VTI:   2.06 cm     TR Vmax:        221.00 cm/s MV Peak grad:  6.0 mmHg MV Mean grad:  2.0 mmHg     SHUNTS MV Vmax:       1.22 m/s     Systemic VTI:  0.20 m MV Vmean:      68.3 cm/s    Systemic Diam: 1.90 cm MV Decel Time: 264 msec MV E velocity: 100.00 cm/s MV A velocity: 92.80 cm/s MV E/A ratio:  1.08 Julien Nordmann MD Electronically signed by Julien Nordmann MD Signature Date/Time:  07/23/2022/2:28:42 PM    Final    CT ANGIO HEAD NECK W WO CM  Result Date: 07/23/2022 CLINICAL DATA:  Stroke, follow up EXAM: CT ANGIOGRAPHY HEAD AND NECK WITH AND WITHOUT CONTRAST TECHNIQUE: Multidetector CT imaging of the head and neck was performed using the standard protocol during bolus administration of intravenous contrast. Multiplanar CT image reconstructions and MIPs were obtained to evaluate the vascular anatomy. Carotid stenosis measurements (when applicable) are obtained utilizing NASCET criteria, using the distal internal carotid diameter as the denominator. RADIATION DOSE REDUCTION: This exam was performed according to the departmental dose-optimization program which includes automated exposure control, adjustment of the mA and/or kV according to patient size and/or use of iterative reconstruction technique. CONTRAST:  75mL OMNIPAQUE IOHEXOL 350 MG/ML SOLN COMPARISON:  MRI Jul 22, 2022. FINDINGS: CT HEAD FINDINGS Brain: Similar appearance of multifocal evolving infarcts in the left PCA distribution, better characterized on prior MRI. No evidence of progressive mass effect or mass occupying acute hemorrhage. No midline shift. No hydrocephalus. Vascular: See below. Skull: No acute fracture. Sinuses/Orbits: Paranasal sinus mucosal thickening. No acute orbital findings. Review of the MIP images confirms the above findings CTA NECK FINDINGS Aortic arch: Great vessel origins are patent without significant stenosis. Right carotid system: Approximately 70% stenosis of the right ICA origin. Left carotid system: Atherosclerosis at the carotid bifurcation without greater than 50% stenosis. Vertebral arteries: Left dominant. Severe stenosis of the proximal right non dominant/small vertebral artery. Left vertebral artery is patent. Skeleton: No acute abnormality on limited assessment. Other neck: Negative. Upper chest: Visualized lung apices are clear. Review of the MIP images confirms  the above findings CTA HEAD  FINDINGS Anterior circulation: Severe proximal left cavernous ICA stenosis. Severe bilateral paraclinoid ICA stenosis bilaterally. Bilateral MCAs are patent without proximal 8 image late significant stenosis. Hypoplastic left ACA. Right A1 ACA and bilateral A2 ACAs are patent without proximal hemodynamically significant stenosis. 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 Severe/critical stenosis versus occlusion of the proximal left PCA with poor/irregular distal opacification. Venous sinuses: As permitted by contrast timing, patent. Review of the MIP images confirms the above findings IMPRESSION: 1. Severe/critical stenosis versus occlusion of the proximal left PCA with poor/irregular distal opacification. 2. Severe bilateral intracranial ICA stenosis. 3. Approximately 70% stenosis of the right ICA origin in the neck. 4. Severe stenosis of the small/non dominant right vertebral artery origin. 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 Electronically Signed   By: Feliberto Harts M.D.   On: 07/23/2022 09:19   CT HEAD WO CONTRAST ( )  Result Date: 07/23/2022 CLINICAL DATA:  Stroke, worsening neurologic examination EXAM: CT HEAD WITHOUT CONTRAST TECHNIQUE: Contiguous axial images were obtained from the base of the skull through the vertex without intravenous contrast. RADIATION DOSE REDUCTION: This exam was performed according to the departmental dose-optimization program which includes automated exposure control, adjustment of the mA and/or kV according to patient size and/or use of iterative reconstruction technique. COMPARISON:  07/22/2022 FINDINGS: Brain: Stable multifocal subacute infarct within the left occipital lobe, thalamus, genu of the internal capsule, and cerebral peduncle. 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 No superimposed acute intracranial hemorrhage. No new abnormal mass  effect or midline shift. Ventricular size is normal. Cerebellum is unremarkable. Vascular: No hyperdense vessel or unexpected calcification. Skull: Normal. Negative for fracture or focal lesion. Sinuses/Orbits: Paranasal sinuses are clear. Orbits are unremarkable. Other: Mastoid air cells and middle ear cavities are clear. IMPRESSION: 1. 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 No superimposed acute intracranial hemorrhage. No new abnormal mass effect or midline shift. Electronically Signed   By: Helyn Numbers M.D.   On: 07/23/2022 00:51   MR BRAIN WO CONTRAST  Result Date: 07/22/2022 CLINICAL DATA:  Altered mental status EXAM: MRI HEAD WITHOUT CONTRAST TECHNIQUE: Multiplanar, multiecho pulse sequences of the brain and surrounding structures were obtained without intravenous contrast. COMPARISON:  None Available. FINDINGS: Brain: Multifocal acute ischemia within the left PCA territory, including the occipital lobe, temporal lobe and thalamus. No acute or chronic hemorrhage. There is multifocal hyperintense T2-weighted signal within the white matter. Parenchymal volume and CSF spaces are normal. The midline structures are normal. Vascular: Major flow voids are preserved. Skull and upper cervical spine: Normal calvarium and skull base. Visualized upper cervical spine and soft tissues are normal. Sinuses/Orbits:No paranasal sinus fluid levels or advanced mucosal thickening. No mastoid or middle ear effusion. Normal orbits. IMPRESSION: Multifocal acute ischemia within the left PCA territory, including the occipital lobe, temporal lobe and thalamus. No hemorrhage or mass effect. Electronically Signed   By: Deatra Robinson M.D.   On: 07/22/2022 20:41   CT HEAD WO CONTRAST ( )  Result Date: 07/22/2022 CLINICAL DATA:  Altered mental status, lethargy EXAM: CT HEAD WITHOUT CONTRAST TECHNIQUE: Contiguous axial images were obtained  from the base of the skull through the vertex without intravenous contrast. RADIATION DOSE REDUCTION: This exam was performed according to the departmental dose-optimization program which includes automated exposure control, adjustment of the mA and/or kV according to patient size  and/or use of iterative reconstruction technique. COMPARISON:  None Available. FINDINGS: Brain: There are no signs of bleeding within the cranium. There is a 2.6 cm area of low-density in posterior left occipital lobe. There is subcentimeter low-density in the left basal ganglia. Cortical sulci are prominent. Vascular: Scattered arterial calcifications are seen. Skull: Unremarkable. Sinuses/Orbits: There is mucosal thickening in ethmoid sinus. Other: None. IMPRESSION: There are no signs of bleeding within the cranium. There is no focal mass effect. There is 2.6 cm low-density in the posterior left occipital lobe suggesting possible encephalomalacia from previous infarction. Less likely possibility would be space-occupying lesion. Follow-up MRI may be considered. The subcentimeter low-density in the left basal ganglia suggesting possible old lacunar infarct. Atrophy. Electronically Signed   By: Ernie Avena M.D.   On: 07/22/2022 14:02   DG Chest Portable 1 View  Result Date: 07/22/2022 CLINICAL DATA:  Shortness of breath EXAM: PORTABLE CHEST 1 VIEW COMPARISON:  10/21/2015 FINDINGS: Cardiac size is within normal limits. There is poor inspiration. There are no signs of pulmonary edema or focal pulmonary consolidation. There is no pleural effusion or pneumothorax. IMPRESSION: There are no signs of pulmonary edema or focal pulmonary consolidation. Electronically Signed   By: Ernie Avena M.D.   On: 07/22/2022 13:56     Subjective: No acute issues/events overnight   Discharge Exam: Vitals:   08/03/22 0114 08/03/22 0818  BP: 107/69 107/74  Pulse: 78 68  Resp: 18 16  Temp: 97.8 F (36.6 C) 97.9 F (36.6 C)  SpO2:  98% 100%   Vitals:   08/02/22 0759 08/02/22 1635 08/03/22 0114 08/03/22 0818  BP: 99/65 113/61 107/69 107/74  Pulse: 66 71 78 68  Resp: 15 16 18 16   Temp: 97.6 F (36.4 C) 97.9 F (36.6 C) 97.8 F (36.6 C) 97.9 F (36.6 C)  TempSrc:    Oral  SpO2: 100% 100% 98% 100%  Weight:      Height:        General: Pt is alert, awake, not in acute distress Cardiovascular: RRR, S1/S2 +, no rubs, no gallops Respiratory: CTA bilaterally, no wheezing, no rhonchi Abdominal: Soft, NT, ND, bowel sounds + Extremities: no edema, no cyanosis    The results of significant diagnostics from this hospitalization (including imaging, microbiology, ancillary and laboratory) are listed below for reference.     Microbiology: No results found for this or any previous visit (from the past 240 hour(s)).   Labs: BNP (last 3 results) No results for input(s): "BNP" in the last 8760 hours. Basic Metabolic Panel: Recent Labs  Lab 07/29/22 0412 07/31/22 0445  NA  --  137  K  --  3.9  CL  --  103  CO2  --  27  GLUCOSE  --  197*  BUN  --  14  CREATININE 0.59 0.54  CALCIUM  --  8.8*   Liver Function Tests: No results for input(s): "AST", "ALT", "ALKPHOS", "BILITOT", "PROT", "ALBUMIN" in the last 168 hours. No results for input(s): "LIPASE", "AMYLASE" in the last 168 hours. No results for input(s): "AMMONIA" in the last 168 hours. CBC: Recent Labs  Lab 07/29/22 0412 07/31/22 0445  WBC 6.7 7.0  HGB 14.6 14.7  HCT 43.1 43.8  MCV 90.4 90.3  PLT 251 241   Cardiac Enzymes: No results for input(s): "CKTOTAL", "CKMB", "CKMBINDEX", "TROPONINI" in the last 168 hours. BNP: Invalid input(s): "POCBNP" CBG: Recent Labs  Lab 08/02/22 0741 08/02/22 1202 08/02/22 1656 08/02/22 2120 08/03/22 0957  GLUCAP  190* 148* 229* 255* 209*   D-Dimer No results for input(s): "DDIMER" in the last 72 hours. Hgb A1c No results for input(s): "HGBA1C" in the last 72 hours. Lipid Profile No results for input(s):  "CHOL", "HDL", "LDLCALC", "TRIG", "CHOLHDL", "LDLDIRECT" in the last 72 hours. Thyroid function studies No results for input(s): "TSH", "T4TOTAL", "T3FREE", "THYROIDAB" in the last 72 hours.  Invalid input(s): "FREET3" Anemia work up No results for input(s): "VITAMINB12", "FOLATE", "FERRITIN", "TIBC", "IRON", "RETICCTPCT" in the last 72 hours. Urinalysis    Component Value Date/Time   COLORURINE YELLOW (A) 07/22/2022 1630   APPEARANCEUR CLEAR (A) 07/22/2022 1630   LABSPEC 1.032 (H) 07/22/2022 1630   PHURINE 6.0 07/22/2022 1630   GLUCOSEU >=500 (A) 07/22/2022 1630   HGBUR NEGATIVE 07/22/2022 1630   BILIRUBINUR NEGATIVE 07/22/2022 1630   KETONESUR 20 (A) 07/22/2022 1630   PROTEINUR NEGATIVE 07/22/2022 1630   NITRITE NEGATIVE 07/22/2022 1630   LEUKOCYTESUR TRACE (A) 07/22/2022 1630   Sepsis Labs Recent Labs  Lab 07/29/22 0412 07/31/22 0445  WBC 6.7 7.0   Microbiology No results found for this or any previous visit (from the past 240 hour(s)).   Time coordinating discharge: Over 30 minutes  SIGNED:   Azucena Fallen, DO [facility] 08/03/2022, 10:16 AM Pager   If 7PM-7AM, please contact night-coverage [email or web address]

## 2023-05-20 ENCOUNTER — Other Ambulatory Visit: Payer: Self-pay

## 2023-05-20 ENCOUNTER — Emergency Department: Payer: Self-pay

## 2023-05-20 ENCOUNTER — Emergency Department
Admission: EM | Admit: 2023-05-20 | Discharge: 2023-05-20 | Disposition: A | Discharge status: Home / Self Care | Payer: Self-pay | Attending: Emergency Medicine | Admitting: Emergency Medicine

## 2023-05-20 DIAGNOSIS — E119 Type 2 diabetes mellitus without complications: Secondary | ICD-10-CM | POA: Insufficient documentation

## 2023-05-20 DIAGNOSIS — R4182 Altered mental status, unspecified: Secondary | ICD-10-CM | POA: Insufficient documentation

## 2023-05-20 DIAGNOSIS — R531 Weakness: Secondary | ICD-10-CM | POA: Insufficient documentation

## 2023-05-20 DIAGNOSIS — W19XXXA Unspecified fall, initial encounter: Secondary | ICD-10-CM | POA: Insufficient documentation

## 2023-05-20 LAB — CBC WITH DIFFERENTIAL/PLATELET
Abs Immature Granulocytes: 0.04 10*3/uL (ref 0.00–0.07)
Basophils Absolute: 0.1 10*3/uL (ref 0.0–0.1)
Basophils Relative: 1 %
Eosinophils Absolute: 0.1 10*3/uL (ref 0.0–0.5)
Eosinophils Relative: 1 %
HCT: 44.8 % (ref 36.0–46.0)
Hemoglobin: 15.3 g/dL — ABNORMAL HIGH (ref 12.0–15.0)
Immature Granulocytes: 0 %
Lymphocytes Relative: 16 %
Lymphs Abs: 1.6 10*3/uL (ref 0.7–4.0)
MCH: 30.2 pg (ref 26.0–34.0)
MCHC: 34.2 g/dL (ref 30.0–36.0)
MCV: 88.4 fL (ref 80.0–100.0)
Monocytes Absolute: 0.8 10*3/uL (ref 0.1–1.0)
Monocytes Relative: 8 %
Neutro Abs: 7.4 10*3/uL (ref 1.7–7.7)
Neutrophils Relative %: 74 %
Platelets: 274 10*3/uL (ref 150–400)
RBC: 5.07 MIL/uL (ref 3.87–5.11)
RDW: 11.7 % (ref 11.5–15.5)
WBC: 10 10*3/uL (ref 4.0–10.5)
nRBC: 0 % (ref 0.0–0.2)

## 2023-05-20 LAB — COMPREHENSIVE METABOLIC PANEL
ALT: 28 U/L (ref 0–44)
AST: 24 U/L (ref 15–41)
Albumin: 3.7 g/dL (ref 3.5–5.0)
Alkaline Phosphatase: 98 U/L (ref 38–126)
Anion gap: 13 (ref 5–15)
BUN: 13 mg/dL (ref 6–20)
CO2: 22 mmol/L (ref 22–32)
Calcium: 9.3 mg/dL (ref 8.9–10.3)
Chloride: 98 mmol/L (ref 98–111)
Creatinine, Ser: 0.56 mg/dL (ref 0.44–1.00)
GFR, Estimated: >60 mL/min (ref >60)
Glucose, Bld: 380 mg/dL — ABNORMAL HIGH (ref 70–99)
Potassium: 3.9 mmol/L (ref 3.5–5.1)
Sodium: 133 mmol/L — ABNORMAL LOW (ref 135–145)
Total Bilirubin: 0.8 mg/dL (ref 0.0–1.2)
Total Protein: 7.3 g/dL (ref 6.5–8.1)

## 2023-05-20 NOTE — ED Provider Notes (Signed)
 [facility] Provider Note    Event Date/Time   First MD Initiated Contact with Patient 05/20/23 1733     (approximate)   History   Fall   HPI  Destiny Palmer is a 58 y.o. female with history of type 2 diabetes, adjustment disorder, and PTSD who presents with a fall and weakness.  Per the patient and her family ember, she fell earlier today, around 7 AM.  She states she did not hit her head, lose consciousness, or injure herself.  She states that subsequently she had been more tired than normal and lying in bed.  She was also noted to possibly be confused.  A family member became concerned that she could have had a stroke and wanted her to be evaluated.  The patient herself states she feels completely normal.  She denies any acute complaints.  Her granddaughter who is with her now states that she is pretty much at her baseline mental status although EMS reported that she was somewhat confused.  I reviewed the past medical records.  The patient was most recently admitted to the hospitalist service in May of last year with acute metabolic encephalopathy and acute PCA stroke.   Physical Exam   Triage Vital Signs: ED Triage Vitals [05/20/23 1732]  Encounter Vitals Group     BP      Systolic BP Percentile      Diastolic BP Percentile      Pulse      Resp      Temp 98.3 F (36.8 C)     Temp Source Oral     SpO2      Weight      Height      Head Circumference      Peak Flow      Pain Score      Pain Loc      Pain Education      Exclude from Growth Chart     Most recent vital signs: Vitals:   05/20/23 2129 05/20/23 2131  BP:  (!) 144/89  Pulse: 95   Resp: 18   Temp:    SpO2: 98%      General: Alert and oriented, comfortable appearing, no distress.  CV:  Good peripheral perfusion.  Resp:  Normal effort.  Abd:  No distention.  Other:  RUE paresis, chronic.  Motor intact in all other extremities.  Right facial droop, also chronic.  Normal  speech.   ED Results / Procedures / Treatments   Labs (all labs ordered are listed, but only abnormal results are displayed) Labs Reviewed  COMPREHENSIVE METABOLIC PANEL - Abnormal; Notable for the following components:      Result Value   Sodium 133 (*)    Glucose, Bld 380 (*)    All other components within normal limits  CBC WITH DIFFERENTIAL/PLATELET - Abnormal; Notable for the following components:   Hemoglobin 15.3 (*)    All other components within normal limits  URINALYSIS, ROUTINE W REFLEX MICROSCOPIC     EKG  ED ECG REPORT I, Dionne Bucy, the attending physician, personally viewed and interpreted this ECG.  Date: 05/20/2023 EKG Time: 1738 Rate: 94 Rhythm: normal sinus rhythm (incorrectly read by machine as atrial flutter) QRS Axis: normal Intervals: LAFB ST/T Wave abnormalities: Nonspecific ST abnormalities Narrative Interpretation: no evidence of acute ischemia; no significant change when compared to EKG of 07/22/22    RADIOLOGY  CT head: I independently viewed and interpreted the images; there  is no ICH.  Radiology report indicates the following:  IMPRESSION:  Interval development of left occipital encephalomalacia. Question  subacute component with loss of gray-white matter differentiation  along the left temporal occipital lobe. Subacute infarction not  excluded.   MR brain:  IMPRESSION:  1. No acute intracranial abnormality.  2. Chronic left PCA distribution infarct, with additional small  remote bilateral frontal infarcts.  3. Underlying moderate chronic microvascular ischemic disease.    PROCEDURES:  Critical Care performed: No  Procedures   MEDICATIONS ORDERED IN ED: Medications - No data to display   IMPRESSION / MDM / ASSESSMENT AND PLAN / ED COURSE  I reviewed the triage vital signs and the nursing notes.  58 year old female with PMH as noted above presents after fall earlier today, with no apparent injury, and also with  concern for altered mental status noted by family although the patient denies any acute symptoms and the family are with her now states that she is at her baseline.  On exam the patient is borderline tachycardic and hypertensive with otherwise normal vital signs.  Neurologic exam is nonfocal except for right facial droop and right arm weakness which are chronic from a prior stroke.  Differential diagnosis includes, but is not limited to, dehydration, electrolyte abnormality, UTI or other infection, less likely acute CVA or other CNS cause.  There is no evidence of cardiac etiology.  We will obtain a CT, basic labs, urinalysis, and reassess.  Patient's presentation is most consistent with acute presentation with potential threat to life or bodily function.  The patient is on the cardiac monitor to evaluate for evidence of arrhythmia and/or significant heart rate changes.  ----------------------------------------- 6:38 PM on 05/20/2023 -----------------------------------------  CT shows encephalomalacia from the prior stroke but also a possible subacute infarct.  There does not appear to be an acute component.  However this will need additional workup with an MRI.  There is no indication for code stroke activation given that the patient has no focal neurologic deficits on exam.  ----------------------------------------- 9:22 PM on 05/20/2023 -----------------------------------------  MR brain is negative for acute or subacute infarct.  The patient remains asymptomatic.  Therefore, she is appropriate for discharge home.  She is eager to be discharged.  I gave the patient and family member strict return precautions and they expressed understanding.  FINAL CLINICAL IMPRESSION(S) / ED DIAGNOSES   Final diagnoses:  Fall, initial encounter  Altered mental status, unspecified altered mental status type     Rx / DC Orders   ED Discharge Orders     None        Note:  This document was  prepared using Dragon voice recognition software and may include unintentional dictation errors.    Dionne Bucy, MD 05/20/23 2300

## 2023-05-20 NOTE — Discharge Instructions (Addendum)
 The MRI did not show any signs of a new stroke.  The blood tests are all normal.  Follow-up with the primary care provider.  Return to the ER for new, worsening, or persistent severe weakness, confusion or change in mental status, strokelike symptoms, recurrent falls, or any other new or worsening symptoms that concern you.

## 2023-05-20 NOTE — ED Notes (Signed)
Dr. Siadecki at bedside at this time.  °

## 2023-05-20 NOTE — ED Triage Notes (Addendum)
 Pt arrived via EMS for a fall that happened this AM. Pt has been slightly confused with not being able to get sentences correct. Pt sts that she fell and nothing is wrong. Pt family sts that pt is confused. Pt has had previous stroke and does have right sided deficits. Pt sts that she does take blood thinners.

## 2023-08-01 ENCOUNTER — Ambulatory Visit (LOCAL_COMMUNITY_HEALTH_CENTER): Payer: Self-pay

## 2023-08-01 DIAGNOSIS — Z111 Encounter for screening for respiratory tuberculosis: Secondary | ICD-10-CM

## 2023-08-01 NOTE — Progress Notes (Signed)
 In nurse clinic for PPD as needed for placement at residential facility. Patient in wheelchair and accompanied by [place] [employer]. DSS has guardianship.   PPD placed today and plans to return for PPDR Wednesday 07/04/23 between 4:20 pm and 5 pm. Derinda Fleischer, RN

## 2023-08-03 ENCOUNTER — Other Ambulatory Visit: Payer: Self-pay

## 2023-08-04 ENCOUNTER — Ambulatory Visit: Payer: Self-pay

## 2023-08-04 DIAGNOSIS — Z111 Encounter for screening for respiratory tuberculosis: Secondary | ICD-10-CM

## 2023-08-04 LAB — TB SKIN TEST
Induration: 0 mm
TB Skin Test: NEGATIVE

## 2023-08-29 ENCOUNTER — Other Ambulatory Visit: Payer: Self-pay

## 2023-08-29 ENCOUNTER — Ambulatory Visit (LOCAL_COMMUNITY_HEALTH_CENTER): Payer: Self-pay

## 2023-08-29 DIAGNOSIS — Z111 Encounter for screening for respiratory tuberculosis: Secondary | ICD-10-CM

## 2023-08-29 NOTE — Progress Notes (Signed)
 In nurse clinic for 2nd step PPD as needed for placement at residential facility. Patient in wheelchair  and accompanied by [place] [employer]. DSS has guardianship.    PPD placed today and plans to return for PPDR Wednesday 08/31/2023. Nikia Mangino, RN

## 2023-08-31 ENCOUNTER — Ambulatory Visit (LOCAL_COMMUNITY_HEALTH_CENTER): Payer: Self-pay

## 2023-08-31 DIAGNOSIS — Z111 Encounter for screening for respiratory tuberculosis: Secondary | ICD-10-CM

## 2023-08-31 LAB — TB SKIN TEST
Induration: 0 mm
TB Skin Test: NEGATIVE
